# Patient Record
Sex: Male | Born: 1940 | ZIP: ~
Health system: Southern US, Community
[De-identification: ages and names within clinical notes are randomized; demographics above are authoritative.]

## PROBLEM LIST (undated history)

## (undated) DIAGNOSIS — Z87438 Personal history of other diseases of male genital organs: Secondary | ICD-10-CM

## (undated) DIAGNOSIS — C61 Malignant neoplasm of prostate: Secondary | ICD-10-CM

## (undated) DIAGNOSIS — Z9889 Other specified postprocedural states: Secondary | ICD-10-CM

## (undated) DIAGNOSIS — N138 Other obstructive and reflux uropathy: Secondary | ICD-10-CM

## (undated) DIAGNOSIS — N529 Male erectile dysfunction, unspecified: Secondary | ICD-10-CM

## (undated) DIAGNOSIS — K219 Gastro-esophageal reflux disease without esophagitis: Secondary | ICD-10-CM

## (undated) DIAGNOSIS — I10 Essential (primary) hypertension: Secondary | ICD-10-CM

## (undated) DIAGNOSIS — G7000 Myasthenia gravis without (acute) exacerbation: Secondary | ICD-10-CM

## (undated) DIAGNOSIS — N401 Enlarged prostate with lower urinary tract symptoms: Secondary | ICD-10-CM

## (undated) DIAGNOSIS — H35349 Macular cyst, hole, or pseudohole, unspecified eye: Secondary | ICD-10-CM

## (undated) DIAGNOSIS — E7800 Pure hypercholesterolemia, unspecified: Secondary | ICD-10-CM

## (undated) HISTORY — DX: Gastro-esophageal reflux disease without esophagitis: K21.9

## (undated) HISTORY — PX: APPENDECTOMY: SHX54

## (undated) HISTORY — DX: Pure hypercholesterolemia, unspecified: E78.00

## (undated) HISTORY — DX: Malignant neoplasm of prostate: C61

## (undated) HISTORY — DX: Macular cyst, hole, or pseudohole, unspecified eye: H35.349

## (undated) HISTORY — DX: Other obstructive and reflux uropathy: N13.8

## (undated) HISTORY — PX: PARS PLANA VITRECTOMY W/ REPAIR OF MACULAR HOLE: SHX2170

## (undated) HISTORY — DX: Personal history of other diseases of male genital organs: Z87.438

## (undated) HISTORY — DX: Essential (primary) hypertension: I10

## (undated) HISTORY — DX: Other specified postprocedural states: Z98.890

## (undated) HISTORY — PX: UVULOPLASTY: SHX6557

## (undated) HISTORY — PX: ROTATOR CUFF REPAIR: SHX139

## (undated) HISTORY — DX: Benign prostatic hyperplasia with lower urinary tract symptoms: N40.1

## (undated) HISTORY — DX: Male erectile dysfunction, unspecified: N52.9

---

## 2000-02-09 ENCOUNTER — Ambulatory Visit (HOSPITAL_BASED_OUTPATIENT_CLINIC_OR_DEPARTMENT_OTHER): Admission: RE | Admit: 2000-02-09 | Discharge: 2000-02-09 | Payer: Self-pay | Admitting: Otolaryngology

## 2000-10-17 ENCOUNTER — Ambulatory Visit (HOSPITAL_COMMUNITY): Admission: RE | Admit: 2000-10-17 | Discharge: 2000-10-18 | Payer: Self-pay | Admitting: Ophthalmology

## 2000-10-17 ENCOUNTER — Encounter: Payer: Self-pay | Admitting: Ophthalmology

## 2002-11-18 ENCOUNTER — Encounter (INDEPENDENT_AMBULATORY_CARE_PROVIDER_SITE_OTHER): Payer: Self-pay | Admitting: Specialist

## 2002-11-18 ENCOUNTER — Ambulatory Visit (HOSPITAL_COMMUNITY): Admission: RE | Admit: 2002-11-18 | Discharge: 2002-11-18 | Payer: Self-pay | Admitting: Gastroenterology

## 2003-07-10 ENCOUNTER — Ambulatory Visit (HOSPITAL_COMMUNITY): Admission: RE | Admit: 2003-07-10 | Discharge: 2003-07-11 | Payer: Self-pay | Admitting: Ophthalmology

## 2005-06-03 ENCOUNTER — Encounter: Admission: RE | Admit: 2005-06-03 | Discharge: 2005-06-03 | Payer: Self-pay | Admitting: Family Medicine

## 2007-04-19 ENCOUNTER — Encounter: Admission: RE | Admit: 2007-04-19 | Discharge: 2007-04-19 | Payer: Self-pay | Admitting: Family Medicine

## 2007-05-31 ENCOUNTER — Encounter: Admission: RE | Admit: 2007-05-31 | Discharge: 2007-06-12 | Payer: Self-pay | Admitting: Orthopedic Surgery

## 2008-04-25 ENCOUNTER — Ambulatory Visit (HOSPITAL_BASED_OUTPATIENT_CLINIC_OR_DEPARTMENT_OTHER): Admission: RE | Admit: 2008-04-25 | Discharge: 2008-04-25 | Payer: Self-pay | Admitting: Urology

## 2008-04-25 ENCOUNTER — Encounter (INDEPENDENT_AMBULATORY_CARE_PROVIDER_SITE_OTHER): Payer: Self-pay | Admitting: Urology

## 2009-03-17 ENCOUNTER — Encounter: Admission: RE | Admit: 2009-03-17 | Discharge: 2009-03-17 | Payer: Self-pay | Admitting: Family Medicine

## 2010-04-28 LAB — POCT I-STAT 4, (NA,K, GLUC, HGB,HCT)
Glucose, Bld: 105 mg/dL — ABNORMAL HIGH (ref 70–99)
HCT: 46 % (ref 39.0–52.0)
Hemoglobin: 15.6 g/dL (ref 13.0–17.0)
Potassium: 3.9 mEq/L (ref 3.5–5.1)
Sodium: 143 mEq/L (ref 135–145)

## 2010-06-01 NOTE — Op Note (Signed)
NAME:  Travis Morris, WHORLEY             ACCOUNT NO.:  [id number]   MEDICAL RECORD NO.:  [id number]          PATIENT TYPE:  AMB   LOCATION:  [facility]                         FACILITY:  [facility]   PHYSICIAN:  Maretta Bees. Vonita Moss, M.D.DATE OF BIRTH:  [birth year]   DATE OF PROCEDURE:  04/25/2008  DATE OF DISCHARGE:                               OPERATIVE REPORT   PREOPERATIVE DIAGNOSIS:  Phimosis and balanitis.   POSTOPERATIVE DIAGNOSES:  Phimosis and balanitis.   PROCEDURE:  Circumcision and biopsy of glans penis.   SURGEON:  Dr. Larey Dresser   ANESTHESIA:  General.   INDICATIONS:  This gentleman has had an increasing history over the last  year of penile irritation, swelling and thickening of his foreskin and  redness of the glans penis.  He was advised that this will probably be a  progressive problem and he opted to go ahead with circumcision and  biopsy of the glans penis.  He was advised about the risk of swelling,  bleeding and postop discomfort.   PROCEDURE:  The patient was brought to the operating room, placed in  supine position, external genitalia prepped and draped in usual fashion.  A circumcision was performed using the sleeve technique.  Hemostasis was  obtained by the use of catgut ties and electrocautery.  The frenulum  area was closed with running 4-0 chromic catgut and 4-0 chromic catgut  was placed at 3, 6, 9 and 12 o'clock to approximate the penile skin with  the mucosal edge of foreskin.  Before running 4-0 chromic catgut between  these quadrant sutures, I injected 0.25% Marcaine.  I also did a biopsy  of an erythematous area on the bottom of the glans penis and closed this  biopsy site with 4-0 chromic catgut.  At this point, there was good  hemostasis.  Blood loss was minimal.  The wound was cleaned and dressed  with Vaseline gauze, dry sterile gauze and a Coban dressing.  He was  taken to the recovery room in good condition, having tolerated the  procedure  well      Maretta Bees. Vonita Moss, M.D.  Electronically Signed     LJP/MEDQ  D:  04/25/2008  T:  04/25/2008  Job:  [id number]   cc:   Jethro Bastos, M.D.  Fax: [phone]

## 2010-06-04 NOTE — Op Note (Signed)
   NAME:  Travis Morris, Travis Morris                       ACCOUNT NO.:  [id number]   MEDICAL RECORD NO.:  [id number]                   PATIENT TYPE:  AMB   LOCATION:  ENDO                                 FACILITY:  [facility]   PHYSICIAN:  Graylin Shiver, M.D.                DATE OF BIRTH:  22-Dec-1940   DATE OF PROCEDURE:  11/18/2002  DATE OF DISCHARGE:                                 OPERATIVE REPORT   PROCEDURE:  Colonoscopy with biopsy.   ENDOSCOPIST:  Graylin Shiver, M.D.   INDICATIONS FOR PROCEDURE:  A family history of colon cancer.   INFORMED CONSENT:  An informed consent was obtained after the explanation of  the risks of bleeding, infection and perforation.   PREMEDICATION:  Fentanyl 70 mcg IV, Versed 7 mg IV.   DESCRIPTION OF PROCEDURE:  With the patient in the left lateral decubitus  position, a rectal examination was performed, and no masses were felt.  The  Olympus colonoscope was inserted into the rectum and advanced around the  colon to the cecum.  The cecal landmarks were identified.  The cecum looked  normal.  In the mid-ascending colon there was a small 3.0 mm sessile polyp  removed with cold forceps.  The transverse colon, descending colon, sigmoid  and rectum were normal.  He tolerated the procedure well without complications.   IMPRESSION:  Small polyp in the ascending colon.   PLAN:  The pathology will be checked.  I would recommend a follow-up  colonoscopy again in five years.                                               Graylin Shiver, M.D.    SFG/MEDQ  D:  11/18/2002  T:  11/18/2002  Job:  [id number]   cc:   Jethro Bastos, M.D.  [address]  [place]  [place] [address]  Fax: [phone]

## 2010-06-04 NOTE — Op Note (Signed)
[place]  Patient:    Travis, Morris Visit Number: [id number] MRN: [id number]          Service Type: DSU Location: RCRM 2550 07 Attending Physician:  Bertrum Sol Dictated by:   Beulah Gandy. Ashley Royalty, M.D. Proc. Date: 10/17/00 Admit Date:  10/17/2000                             Operative Report  DATE OF BIRTH:  01-Aug-1940  PREOPERATIVE DIAGNOSIS:  Macular hole, right eye.  POSTOPERATIVE DIAGNOSIS:  OPERATION PERFORMED:  Pars plana vitrectomy, retinal photocoagulation, membrane peel, serum patch and perfluoropropane injection, right eye.  SURGEON:  Beulah Gandy. Ashley Royalty, M.D.  ASSISTANT:  Lu Duffel, COA, SA  ANESTHESIA:  General.  DESCRIPTION OF PROCEDURE:  Usual prep and drape.  Peritomies at 8, 10 and 2 oclock.  4 mm infusion port anchored into place at 8 oclock.  The contact lens ring was anchored into place at 6 and 12 oclock.  The light pick and the cutter were placed at 10 and 2 oclock respectively.  The flat contact lens was placed onto a layer of methylcellulose and onto the cornea.  The pars plana vitrectomy was begun just behind the crystalline lens.  Vitreous membranes were encountered.  These were carefully removed under low suction and rapid cutting.  Some vitreous tissue was adherent to the disk and this was lifted free with the pick and the forceps.  The silicone tip suction line was moved into place and drawn down to the macular surface where the fish strike sign occurred.  Multiple areas of posterior hyaloid were pulled up with the suction line and peeled with the vitreous pick.  Once this was accomplished, the diamond dusted membrane scraper was used to remove the internal limiting membrane around the edge of the macular hole.  The vitrectomy carried out into the far peripheral vitreous space with additional vitreous cutting.  Once this was accomplished, the head scope indirect laser was used to deliver 446  burns around the retinal periphery in two rows.  The power was 400 milliwatts 1000 microns each and 0.1 seconds each.  A gas-fluid exchange was carried out. Sufficient time was allowed for additional fluid to track down the walls of the eye and collect in the posterior segments.  Additional fluid removal was performed with the [employer] brush.  Serum patch was delivered. Perfluoropropane 16% was exchanged for intravitreal gas.  The instruments were removed from the eye and 9-0 nylon was used to close the sclerotomy sites. Conjunctiva was closed with wetfield cautery.  Closing tension was 10 with a Barraquer tonometer.  Polymyxin and gentamicin were irrigated into Tenons space.  Atropine solution was applied.  Decadron 10 mg was injected into the lower subconjunctival space.  Marcaine was injected around the globe for postoperative pain.  Polysporin, a patch and shield were placed.  The patient was awakened and taken to recovery in satisfactory condition.  COMPLICATIONS:  None.  DURATION:  One hour. Dictated by:   Beulah Gandy. Ashley Royalty, M.D. Attending Physician:  Bertrum Sol DD:  10/17/00 TD:  10/17/00 Job: [id number]

## 2010-06-04 NOTE — Op Note (Signed)
NAME:  Travis Morris, Travis Morris                       ACCOUNT NO.:  [id number]   MEDICAL RECORD NO.:  [id number]                   PATIENT TYPE:  OIB   LOCATION:  2894                                 FACILITY:  [facility]   PHYSICIAN:  Beulah Gandy. Ashley Royalty, M.D.              DATE OF BIRTH:  03/22/1940   DATE OF PROCEDURE:  07/10/2003  DATE OF DISCHARGE:                                 OPERATIVE REPORT   PREOPERATIVE DIAGNOSIS:  Macular hole, left eye.   POSTOPERATIVE DIAGNOSIS:  Macular hole, left eye.   OPERATION PERFORMED:  Pars plana vitrectomy, membrane peel, serum patch,  retinal photocoagulation, perfluoropropane injection in the left eye.   SURGEON:  Beulah Gandy. Ashley Royalty, M.D.   ASSISTANT:  Bryan Lemma. Lundquist, P.A.   ANESTHESIA:  General.   DESCRIPTION OF PROCEDURE:  Usual prep and drape.  Peritomies at 10, 2 and 4  o'clock.  The 4 mm angled infusion port was anchored into place at 4  o'clock.  The lighted pick and the cutter were placed at 10 and 2 o'clock  respectively.  Contact lens ring anchored into place at 6 and 12 o'clock.  Methylcellulose was placed on the corneal surface and the flat contact lens  was placed.  The pars plana vitrectomy was begun just behind the crystalline  lens.  Vitreous and membranes were encountered.  These were carefully  removed under low suction and rapid cutting.  A large clot of blood was seen  overlying the macular region.  This was removed carefully with the vitreous  cutter and the extrusion line.  The posterior hyaloid was ensnared in this  clot and it was removed carefully under low suction and rapid cutting.  The  posterior hyaloid was pulled from its attachment to the disk as well.  The  lighted pick was used for this membrane peel. Once this was accomplished, a  30 degree prismatic lens was moved into place and the vitrectomy was carried  out into the far periphery.  The vitreous base was trimmed for 360 degrees.  The magnifying contact lens was  moved into place and the diamond dusted  membrane scraper was used to remove the internal limiting membrane from  around the edge of the macular hole.  The instruments were removed from the  eye.  The indirect ophthalmoscope laser was moved into place.  671 burns  were placed in two rows around the retinal periphery.  The power was 400 mW,  1000 microns each and 0.1 seconds each.  A gas-fluid exchange was carried  out.  Sufficient time was allowed for additional fluid to track down the  walls of the eye and collect in the posterior segment.  The additional fluid  was removed.  The serum patch was delivered, perfluoropropane 17% was  exchanged for intravitreal gas.  The instruments were removed from the eye  and 9-0 nylon was used to close the sclerotomy sites.  The  conjunctiva was  closed with wet field cautery.  Polymyxin and gentamicin were irrigated into  Tenon's space.  Atropine solution was applied.  Decadron 10 mg was injected  into the lower subconjunctival space.  Marcaine was injected around the  globe for postoperative pain.  Polysporin, a patch and shield were placed.  The closing tension was 10 with a Barraquer tonometer.  The patient was  awakened and taken to recovery in satisfactory condition.                                               Beulah Gandy. Ashley Royalty, M.D.   JDM/MEDQ  D:  07/10/2003  T:  07/12/2003  Job:  [id number]

## 2010-11-24 ENCOUNTER — Other Ambulatory Visit: Payer: Self-pay | Admitting: Urology

## 2010-12-07 ENCOUNTER — Encounter: Payer: Self-pay | Admitting: *Deleted

## 2010-12-07 DIAGNOSIS — C61 Malignant neoplasm of prostate: Secondary | ICD-10-CM | POA: Insufficient documentation

## 2010-12-07 NOTE — Progress Notes (Signed)
PATH: PROSTATE BXS: 11/17/10=  ADENOCARCINOMA, GLEASO=3+4=7 & 3+3=6, VOLUME=43CC,PSA=6.84 04/05/10 PSA=4.42, BXS DONE HIGH GRADE PROSTATIC INTRAEPITHELIAL NEOPLASIA   MARRIED, ACCOUNTANT,2 CHILDREN,FRATERNAL HX LIVER  CANCER   ALLERGIES:NKDA

## 2010-12-08 ENCOUNTER — Encounter: Payer: Self-pay | Admitting: Radiation Oncology

## 2010-12-08 ENCOUNTER — Ambulatory Visit
Admission: RE | Admit: 2010-12-08 | Discharge: 2010-12-08 | Disposition: A | Discharge status: Home / Self Care | Payer: Medicare Other | Source: Ambulatory Visit | Attending: Radiation Oncology | Admitting: Radiation Oncology

## 2010-12-08 ENCOUNTER — Telehealth: Payer: Self-pay | Admitting: *Deleted

## 2010-12-08 VITALS — BP 148/65 | HR 62 | Temp 97.1°F | Resp 20 | Ht 68.0 in | Wt 212.3 lb

## 2010-12-08 DIAGNOSIS — K219 Gastro-esophageal reflux disease without esophagitis: Secondary | ICD-10-CM | POA: Insufficient documentation

## 2010-12-08 DIAGNOSIS — Z7982 Long term (current) use of aspirin: Secondary | ICD-10-CM | POA: Insufficient documentation

## 2010-12-08 DIAGNOSIS — I10 Essential (primary) hypertension: Secondary | ICD-10-CM | POA: Insufficient documentation

## 2010-12-08 DIAGNOSIS — Z79899 Other long term (current) drug therapy: Secondary | ICD-10-CM | POA: Insufficient documentation

## 2010-12-08 DIAGNOSIS — C61 Malignant neoplasm of prostate: Secondary | ICD-10-CM

## 2010-12-08 DIAGNOSIS — E7800 Pure hypercholesterolemia, unspecified: Secondary | ICD-10-CM | POA: Insufficient documentation

## 2010-12-08 NOTE — Progress Notes (Signed)
I[pps score=9, no c/o pain  Or discomfort, \9:32 AM

## 2010-12-08 NOTE — Progress Notes (Signed)
Please see the Nurse Progress Note in the MD Initial Consult Encounter for this patient. 

## 2010-12-08 NOTE — Telephone Encounter (Signed)
A user error has taken place: encounter opened in error, closed for administrative reasons.

## 2010-12-13 NOTE — Progress Notes (Signed)
CC:   Excell Seltzer. Annabell Howells, M.D. Ancil Boozer, MD  REFERRING PHYSICIAN:  Excell Seltzer. Annabell Howells, M.D.   DIAGNOSIS:  Stage T1c, Gleason 7 adenocarcinoma of the prostate.  HISTORY OF PRESENT ILLNESS:  Travis Morris is a very pleasant, 70 year old, gentleman who is seen as a courtesy for Dr. Annabell Howells for an opinion concerning radiation therapy as part of the management of the patient's recently diagnosed early stage prostate cancer.  Mr. Ferrufino presented recently with a mildly elevated PSA.  The patient is seeing Dr. Larey Dresser for this issue.  The biopsy at that time showed atypia HGPIN.  More recently, the patient's PSA had risen from 4.8 in March 2012 to 6.8.  In light of this, a repeat biopsy was indicated.  The patient was taken to the operating room by Dr. Bjorn Pippin.  On the ultrasound, the prostate measured 43 cc.  On this evaluation, the patient was noted to have 4 cores showing adenocarcinoma.  The left base showed a Gleason score of 7 (3+4), showing 30% of 1 core involvement.  In addition, the left mid, left lateral apex, and left apex areas also showed adenocarcinoma with a Gleason score of 6 (3+3).  In light of the above findings, the options for management were discussed in detail with the patient by Dr. Annabell Howells.  The issues discussed were watchful waiting, radical prostatectomy, and radiation therapy. The patient is now interested in hearing more concerning his radiation therapy options and is now referred to our department for this issue.  ALLERGIES:  The patient has no known drug allergies.  CURRENT MEDICATIONS: 1. Aspirin 81 mg daily. 2. HydroDIURIL 25 mg. 3. Bystolic 10 mg. 4. Prilosec 20 mg. 5. Crestor 10 mg. 6. Saw palmetto 80 mg capsule. 7. Vitamin D 400 international units. 8. Multivitamin supplement.  PRIOR SURGERIES: 1. Appendectomy. 2. Uvulopalatoplasty in the past. 3. Rotator cuff repair.  MEDICAL HISTORY:  Significant for: 1. BPH with urinary retention. 2.  History of organic impotence. 3. History of balanitis. 4. GERD. 5. Hypercholesterolemia. 6. Hypertension. 7. History of phimosis.  SOCIAL HISTORY:  The patient is married and is accompanied by his wife on evaluation today.  The patient drinks between 3-4 ounces of gin weekly.  There is no history of tobacco use.  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  FAMILY HISTORY:  No family history of prostate cancer.  The patient's brother died of metastatic colon cancer to the liver at age 65.  The patient's father died of dementia at age 74.  REVIEW OF SYSTEMS:  The patient completed the International Prostate Symptom Score with a total score of 9.  The most significant score was in urinary urgency.  The patient also has a significant score in frequency.  Nocturia is 1.  The patient denies any new bony pain, headaches, dizziness, or blurred vision.  The patient does have impotency, which he has had for some time.  A complete review of systems is undertaken with the patient by myself and other than the above- mentioned issues, it is remarkable.  PHYSICAL EXAMINATION:  General Appearance:  This is a very pleasant, 57- year-old gentleman in no acute distress.  Vital Signs:  Temperature 97.1.  Pulse 62.  Blood pressure 148/65.  Weight is 212 pounds.  Height is 5 feet and 8 inches.  Further exam is deferred.  X-RAY STUDIES:  None indicated.  LABORATORY DATA:  Laboratory data from September  17th, PSA 6.8a4, free PSA is 1.12 ng/mL, and percent free PSA is 16.  IMPRESSION AND PLAN:  Stage T1c, Gleason 7 adenocarcinoma of the prostate.  I discussed the pathologic findings and prognostic implications of his diagnosis.  I discussed with Mr. Buell that his prognosis relates to his presenting PSA, Gleason score, and clinical stage.  All of these factors seem favorable.  I spent over an hour discussing management of early  stage prostate cancer with Mr. Pierre and his wife.  We discussed watchful waiting, radical prostatectomy, robotic-assisted prostatectomy as well as his radiation therapy options including IMRT and combined modality therapy with 5 weeks of external beam followed by radioactive seed implantation.  The patient appears to have relatively excellent health for his age of 40.  He potentially would be a candidate for radical prostatectomy. Given the patient's early stage, I would not recommend androgen ablation as part of his initial management.  I had discussed in detail treatment with IMRT, side effects, and potential toxicities.  We also discussed in detail conformal radiation therapy with limited seed implant.  At this point, the patient is undecided which treatment approach he would like to proceed with.  The patient is quite active with winter sport (curling) and would like to potentially defer any treatments until later in the spring or summer.  Given the patient's early stage as well as low PSA, I feel this would be a reasonable approach.  Mr. Breighner, given his young age, does not feel comfortable with continued watchful waiting, however.  I have recommended he followup with Dr. Annabell Howells in late January or early February for additional PSA.  If this continues to rise, then I would recommend proceeding with treatment.  If his PSA is stable or decreases, then the patient may wish to continue watchful waiting.  At this point, Mr. Laspina does seem to be interested in definitive treatment rather than watchful waiting.  I did also discussed with Mr. Trela that if he does decide on radiation therapy as management, he will need to see Dr. Annabell Howells at a later date for placement of gold fiducial markers for image-guided therapy.    ______________________________ Billie Lade, Ph.D., M.D. JDK/MEDQ  D:  12/08/2010  T:  12/08/2010  Job:  [id number]

## 2011-01-04 ENCOUNTER — Encounter (HOSPITAL_COMMUNITY): Admission: RE | Payer: Self-pay | Source: Ambulatory Visit

## 2011-01-04 ENCOUNTER — Inpatient Hospital Stay (HOSPITAL_COMMUNITY): Admission: RE | Admit: 2011-01-04 | Payer: Medicare Other | Source: Ambulatory Visit | Admitting: Urology

## 2011-01-04 SURGERY — ROBOTIC ASSISTED LAPAROSCOPIC RADICAL PROSTATECTOMY
Anesthesia: General

## 2011-01-27 DIAGNOSIS — C61 Malignant neoplasm of prostate: Secondary | ICD-10-CM | POA: Diagnosis not present

## 2011-02-09 DIAGNOSIS — E7800 Pure hypercholesterolemia, unspecified: Secondary | ICD-10-CM | POA: Diagnosis not present

## 2011-02-09 DIAGNOSIS — I10 Essential (primary) hypertension: Secondary | ICD-10-CM | POA: Diagnosis not present

## 2011-03-14 ENCOUNTER — Ambulatory Visit
Admission: RE | Admit: 2011-03-14 | Discharge: 2011-03-14 | Disposition: A | Discharge status: Home / Self Care | Payer: Medicare Other | Source: Ambulatory Visit | Attending: Radiation Oncology | Admitting: Radiation Oncology

## 2011-03-14 VITALS — BP 120/80 | HR 51 | Temp 97.6°F | Wt 210.9 lb

## 2011-03-14 DIAGNOSIS — C61 Malignant neoplasm of prostate: Secondary | ICD-10-CM | POA: Diagnosis not present

## 2011-03-14 DIAGNOSIS — K219 Gastro-esophageal reflux disease without esophagitis: Secondary | ICD-10-CM | POA: Diagnosis not present

## 2011-03-14 DIAGNOSIS — E7800 Pure hypercholesterolemia, unspecified: Secondary | ICD-10-CM | POA: Diagnosis not present

## 2011-03-14 DIAGNOSIS — Z7982 Long term (current) use of aspirin: Secondary | ICD-10-CM | POA: Insufficient documentation

## 2011-03-14 DIAGNOSIS — Z79899 Other long term (current) drug therapy: Secondary | ICD-10-CM | POA: Diagnosis not present

## 2011-03-14 DIAGNOSIS — I10 Essential (primary) hypertension: Secondary | ICD-10-CM | POA: Insufficient documentation

## 2011-03-14 NOTE — Progress Notes (Signed)
Here for follow up new consult post watchful waiting.Repeat PSA performed 01/27/2011 4.95. Denies pain. No hospitalizations or illness since consultation 11/2010. IpSS score 8. New medication lisinopril added to blood pressure medications.

## 2011-03-14 NOTE — Progress Notes (Signed)
CC:   Excell Seltzer. Annabell Howells, M.D. Ancil Boozer, MD  DIAGNOSIS:  Stage T1c, Gleason 7 adenocarcinoma of the prostate.  NARRATIVE:  Travis Morris returns today for further evaluation.  He was seen in consultation on December 08, 2010.  At that time, the patient was undecided on his treatment approach and was busy with his winter sport, curling.  Clinically, the patient has been doing well.  He has nocturia 1-2 times.  He denies any hematuria or dysuria or bowel complaints.  The patient did have a PSA drawn through Dr. Belva Crome office on January 27, 2011.  This did show the patient's PSA had dropped down to 4.95 from a prior reading in March of last year of 6.8.  PHYSICAL EXAMINATION:  Vital Signs:  The patient's temperature is 97.6, pulse is 51, blood pressure is 120/80.  Weight is 210 pounds. Examination of the neck and supraclavicular region reveals no evidence of adenopathy.  Lungs:  Clear to auscultation.  Heart:  Regular rhythm and rate.  Abdomen:  Soft, nontender with normal bowel sounds.  IMPRESSION AND PLAN:  Stage T1c prostate cancer.  As above, the patient's PSA has decreased since his last evaluation.  At this time, given his PSA drop, Mr. Crihfield is more in favor of watchful waiting.  I have recommended the patient see Dr. Annabell Howells later this spring or early summer for a repeat PSA.  At this time, I have not scheduled Mr. Shells for a formal followup but would be glad to see him at any time.    ______________________________ Billie Lade, Ph.D., M.D. JDK/MEDQ  D:  03/14/2011  T:  03/14/2011  Job:  [id number]

## 2011-03-14 NOTE — Progress Notes (Signed)
Please see the Nurse Progress Note in the MD Initial Consult Encounter for this patient. 

## 2011-03-16 NOTE — Progress Notes (Signed)
Encounter addended by: Tessa Lerner, RN on: 03/16/2011  3:03 PM<BR>     Documentation filed: Charges VN

## 2011-06-08 DIAGNOSIS — C61 Malignant neoplasm of prostate: Secondary | ICD-10-CM | POA: Diagnosis not present

## 2011-08-17 DIAGNOSIS — I10 Essential (primary) hypertension: Secondary | ICD-10-CM | POA: Diagnosis not present

## 2011-08-17 DIAGNOSIS — C61 Malignant neoplasm of prostate: Secondary | ICD-10-CM | POA: Diagnosis not present

## 2011-09-14 DIAGNOSIS — C61 Malignant neoplasm of prostate: Secondary | ICD-10-CM | POA: Diagnosis not present

## 2011-11-15 DIAGNOSIS — Z23 Encounter for immunization: Secondary | ICD-10-CM | POA: Diagnosis not present

## 2011-11-30 DIAGNOSIS — R3915 Urgency of urination: Secondary | ICD-10-CM | POA: Diagnosis not present

## 2011-11-30 DIAGNOSIS — C61 Malignant neoplasm of prostate: Secondary | ICD-10-CM | POA: Diagnosis not present

## 2011-11-30 DIAGNOSIS — R972 Elevated prostate specific antigen [PSA]: Secondary | ICD-10-CM | POA: Diagnosis not present

## 2011-12-14 DIAGNOSIS — E7800 Pure hypercholesterolemia, unspecified: Secondary | ICD-10-CM | POA: Diagnosis not present

## 2011-12-14 DIAGNOSIS — I10 Essential (primary) hypertension: Secondary | ICD-10-CM | POA: Diagnosis not present

## 2011-12-14 DIAGNOSIS — R7309 Other abnormal glucose: Secondary | ICD-10-CM | POA: Diagnosis not present

## 2011-12-28 DIAGNOSIS — R972 Elevated prostate specific antigen [PSA]: Secondary | ICD-10-CM | POA: Diagnosis not present

## 2011-12-28 DIAGNOSIS — C61 Malignant neoplasm of prostate: Secondary | ICD-10-CM | POA: Diagnosis not present

## 2011-12-28 DIAGNOSIS — IMO0002 Reserved for concepts with insufficient information to code with codable children: Secondary | ICD-10-CM | POA: Diagnosis not present

## 2012-03-28 DIAGNOSIS — L299 Pruritus, unspecified: Secondary | ICD-10-CM | POA: Diagnosis not present

## 2012-03-28 DIAGNOSIS — R972 Elevated prostate specific antigen [PSA]: Secondary | ICD-10-CM | POA: Diagnosis not present

## 2012-03-28 DIAGNOSIS — C61 Malignant neoplasm of prostate: Secondary | ICD-10-CM | POA: Diagnosis not present

## 2012-03-29 DIAGNOSIS — E7800 Pure hypercholesterolemia, unspecified: Secondary | ICD-10-CM | POA: Diagnosis not present

## 2012-03-29 DIAGNOSIS — Z0000 Encounter for general adult medical examination without abnormal findings: Secondary | ICD-10-CM | POA: Diagnosis not present

## 2012-03-29 DIAGNOSIS — R7309 Other abnormal glucose: Secondary | ICD-10-CM | POA: Diagnosis not present

## 2012-03-29 DIAGNOSIS — I10 Essential (primary) hypertension: Secondary | ICD-10-CM | POA: Diagnosis not present

## 2012-03-29 DIAGNOSIS — C61 Malignant neoplasm of prostate: Secondary | ICD-10-CM | POA: Diagnosis not present

## 2012-06-20 DIAGNOSIS — H35349 Macular cyst, hole, or pseudohole, unspecified eye: Secondary | ICD-10-CM | POA: Diagnosis not present

## 2012-06-29 DIAGNOSIS — E7800 Pure hypercholesterolemia, unspecified: Secondary | ICD-10-CM | POA: Diagnosis not present

## 2012-06-29 DIAGNOSIS — I10 Essential (primary) hypertension: Secondary | ICD-10-CM | POA: Diagnosis not present

## 2012-07-27 DIAGNOSIS — C61 Malignant neoplasm of prostate: Secondary | ICD-10-CM | POA: Diagnosis not present

## 2012-08-02 DIAGNOSIS — C61 Malignant neoplasm of prostate: Secondary | ICD-10-CM | POA: Diagnosis not present

## 2012-08-02 DIAGNOSIS — R3915 Urgency of urination: Secondary | ICD-10-CM | POA: Diagnosis not present

## 2012-08-02 DIAGNOSIS — R972 Elevated prostate specific antigen [PSA]: Secondary | ICD-10-CM | POA: Diagnosis not present

## 2012-11-09 DIAGNOSIS — Z23 Encounter for immunization: Secondary | ICD-10-CM | POA: Diagnosis not present

## 2012-12-27 DIAGNOSIS — B029 Zoster without complications: Secondary | ICD-10-CM | POA: Diagnosis not present

## 2013-02-04 DIAGNOSIS — C61 Malignant neoplasm of prostate: Secondary | ICD-10-CM | POA: Diagnosis not present

## 2013-04-05 DIAGNOSIS — R7309 Other abnormal glucose: Secondary | ICD-10-CM | POA: Diagnosis not present

## 2013-04-05 DIAGNOSIS — I10 Essential (primary) hypertension: Secondary | ICD-10-CM | POA: Diagnosis not present

## 2013-04-05 DIAGNOSIS — Z23 Encounter for immunization: Secondary | ICD-10-CM | POA: Diagnosis not present

## 2013-04-05 DIAGNOSIS — Z1211 Encounter for screening for malignant neoplasm of colon: Secondary | ICD-10-CM | POA: Diagnosis not present

## 2013-04-05 DIAGNOSIS — Z0000 Encounter for general adult medical examination without abnormal findings: Secondary | ICD-10-CM | POA: Diagnosis not present

## 2013-04-05 DIAGNOSIS — C61 Malignant neoplasm of prostate: Secondary | ICD-10-CM | POA: Diagnosis not present

## 2013-04-05 DIAGNOSIS — R42 Dizziness and giddiness: Secondary | ICD-10-CM | POA: Diagnosis not present

## 2013-04-05 DIAGNOSIS — E7800 Pure hypercholesterolemia, unspecified: Secondary | ICD-10-CM | POA: Diagnosis not present

## 2013-04-25 DIAGNOSIS — C61 Malignant neoplasm of prostate: Secondary | ICD-10-CM | POA: Diagnosis not present

## 2013-04-29 DIAGNOSIS — N138 Other obstructive and reflux uropathy: Secondary | ICD-10-CM | POA: Diagnosis not present

## 2013-04-29 DIAGNOSIS — R3915 Urgency of urination: Secondary | ICD-10-CM | POA: Diagnosis not present

## 2013-04-29 DIAGNOSIS — N139 Obstructive and reflux uropathy, unspecified: Secondary | ICD-10-CM | POA: Diagnosis not present

## 2013-04-29 DIAGNOSIS — C61 Malignant neoplasm of prostate: Secondary | ICD-10-CM | POA: Diagnosis not present

## 2013-04-29 DIAGNOSIS — N401 Enlarged prostate with lower urinary tract symptoms: Secondary | ICD-10-CM | POA: Diagnosis not present

## 2013-07-15 DIAGNOSIS — T148XXA Other injury of unspecified body region, initial encounter: Secondary | ICD-10-CM | POA: Diagnosis not present

## 2013-07-15 DIAGNOSIS — M79609 Pain in unspecified limb: Secondary | ICD-10-CM | POA: Diagnosis not present

## 2013-08-23 ENCOUNTER — Other Ambulatory Visit: Payer: Self-pay | Admitting: Gastroenterology

## 2013-08-23 DIAGNOSIS — Z86010 Personal history of colonic polyps: Secondary | ICD-10-CM | POA: Diagnosis not present

## 2013-08-23 DIAGNOSIS — Z09 Encounter for follow-up examination after completed treatment for conditions other than malignant neoplasm: Secondary | ICD-10-CM | POA: Diagnosis not present

## 2013-08-23 DIAGNOSIS — D126 Benign neoplasm of colon, unspecified: Secondary | ICD-10-CM | POA: Diagnosis not present

## 2013-10-03 DIAGNOSIS — R7309 Other abnormal glucose: Secondary | ICD-10-CM | POA: Diagnosis not present

## 2013-10-03 DIAGNOSIS — I10 Essential (primary) hypertension: Secondary | ICD-10-CM | POA: Diagnosis not present

## 2013-10-03 DIAGNOSIS — Z23 Encounter for immunization: Secondary | ICD-10-CM | POA: Diagnosis not present

## 2013-10-03 DIAGNOSIS — K130 Diseases of lips: Secondary | ICD-10-CM | POA: Diagnosis not present

## 2013-10-03 DIAGNOSIS — E7800 Pure hypercholesterolemia, unspecified: Secondary | ICD-10-CM | POA: Diagnosis not present

## 2013-10-24 DIAGNOSIS — C61 Malignant neoplasm of prostate: Secondary | ICD-10-CM | POA: Diagnosis not present

## 2013-10-31 DIAGNOSIS — C61 Malignant neoplasm of prostate: Secondary | ICD-10-CM | POA: Diagnosis not present

## 2013-10-31 DIAGNOSIS — R3915 Urgency of urination: Secondary | ICD-10-CM | POA: Diagnosis not present

## 2013-10-31 DIAGNOSIS — N401 Enlarged prostate with lower urinary tract symptoms: Secondary | ICD-10-CM | POA: Diagnosis not present

## 2013-10-31 DIAGNOSIS — L291 Pruritus scroti: Secondary | ICD-10-CM | POA: Diagnosis not present

## 2013-11-07 DIAGNOSIS — H26493 Other secondary cataract, bilateral: Secondary | ICD-10-CM | POA: Diagnosis not present

## 2013-11-27 DIAGNOSIS — R062 Wheezing: Secondary | ICD-10-CM | POA: Diagnosis not present

## 2013-11-27 DIAGNOSIS — J988 Other specified respiratory disorders: Secondary | ICD-10-CM | POA: Diagnosis not present

## 2013-11-27 DIAGNOSIS — R069 Unspecified abnormalities of breathing: Secondary | ICD-10-CM | POA: Diagnosis not present

## 2013-11-27 DIAGNOSIS — R509 Fever, unspecified: Secondary | ICD-10-CM | POA: Diagnosis not present

## 2013-11-27 DIAGNOSIS — H109 Unspecified conjunctivitis: Secondary | ICD-10-CM | POA: Diagnosis not present

## 2013-11-29 ENCOUNTER — Other Ambulatory Visit: Payer: Self-pay | Admitting: Family Medicine

## 2013-11-29 ENCOUNTER — Ambulatory Visit
Admission: RE | Admit: 2013-11-29 | Discharge: 2013-11-29 | Disposition: A | Discharge status: Home / Self Care | Payer: Medicare Other | Source: Ambulatory Visit | Attending: Family Medicine | Admitting: Family Medicine

## 2013-11-29 DIAGNOSIS — R509 Fever, unspecified: Secondary | ICD-10-CM | POA: Diagnosis not present

## 2013-11-29 DIAGNOSIS — R0602 Shortness of breath: Secondary | ICD-10-CM | POA: Diagnosis not present

## 2013-11-29 DIAGNOSIS — J988 Other specified respiratory disorders: Secondary | ICD-10-CM

## 2013-12-19 DIAGNOSIS — H35343 Macular cyst, hole, or pseudohole, bilateral: Secondary | ICD-10-CM | POA: Diagnosis not present

## 2014-02-18 DIAGNOSIS — H35372 Puckering of macula, left eye: Secondary | ICD-10-CM | POA: Diagnosis not present

## 2014-02-18 DIAGNOSIS — H35342 Macular cyst, hole, or pseudohole, left eye: Secondary | ICD-10-CM | POA: Diagnosis not present

## 2014-02-18 DIAGNOSIS — Z961 Presence of intraocular lens: Secondary | ICD-10-CM | POA: Diagnosis not present

## 2014-04-03 DIAGNOSIS — H35342 Macular cyst, hole, or pseudohole, left eye: Secondary | ICD-10-CM | POA: Diagnosis not present

## 2014-04-10 DIAGNOSIS — I10 Essential (primary) hypertension: Secondary | ICD-10-CM | POA: Diagnosis not present

## 2014-04-10 DIAGNOSIS — E785 Hyperlipidemia, unspecified: Secondary | ICD-10-CM | POA: Diagnosis not present

## 2014-04-10 DIAGNOSIS — C61 Malignant neoplasm of prostate: Secondary | ICD-10-CM | POA: Diagnosis not present

## 2014-04-10 DIAGNOSIS — Z0000 Encounter for general adult medical examination without abnormal findings: Secondary | ICD-10-CM | POA: Diagnosis not present

## 2014-04-10 DIAGNOSIS — K219 Gastro-esophageal reflux disease without esophagitis: Secondary | ICD-10-CM | POA: Diagnosis not present

## 2014-04-10 DIAGNOSIS — R05 Cough: Secondary | ICD-10-CM | POA: Diagnosis not present

## 2014-04-10 DIAGNOSIS — R7309 Other abnormal glucose: Secondary | ICD-10-CM | POA: Diagnosis not present

## 2014-05-01 DIAGNOSIS — C61 Malignant neoplasm of prostate: Secondary | ICD-10-CM | POA: Diagnosis not present

## 2014-05-08 DIAGNOSIS — N401 Enlarged prostate with lower urinary tract symptoms: Secondary | ICD-10-CM | POA: Diagnosis not present

## 2014-05-08 DIAGNOSIS — N138 Other obstructive and reflux uropathy: Secondary | ICD-10-CM | POA: Diagnosis not present

## 2014-05-08 DIAGNOSIS — R972 Elevated prostate specific antigen [PSA]: Secondary | ICD-10-CM | POA: Diagnosis not present

## 2014-05-08 DIAGNOSIS — C61 Malignant neoplasm of prostate: Secondary | ICD-10-CM | POA: Diagnosis not present

## 2014-05-08 DIAGNOSIS — R3915 Urgency of urination: Secondary | ICD-10-CM | POA: Diagnosis not present

## 2014-05-09 DIAGNOSIS — R05 Cough: Secondary | ICD-10-CM | POA: Diagnosis not present

## 2014-06-12 DIAGNOSIS — H35342 Macular cyst, hole, or pseudohole, left eye: Secondary | ICD-10-CM | POA: Diagnosis not present

## 2014-06-18 DIAGNOSIS — H5213 Myopia, bilateral: Secondary | ICD-10-CM | POA: Diagnosis not present

## 2014-07-07 DIAGNOSIS — C61 Malignant neoplasm of prostate: Secondary | ICD-10-CM | POA: Diagnosis not present

## 2014-08-06 DIAGNOSIS — N5201 Erectile dysfunction due to arterial insufficiency: Secondary | ICD-10-CM | POA: Diagnosis not present

## 2014-08-06 DIAGNOSIS — R3915 Urgency of urination: Secondary | ICD-10-CM | POA: Diagnosis not present

## 2014-08-06 DIAGNOSIS — C61 Malignant neoplasm of prostate: Secondary | ICD-10-CM | POA: Diagnosis not present

## 2014-08-23 ENCOUNTER — Emergency Department (HOSPITAL_COMMUNITY): Payer: Medicare Other

## 2014-08-23 ENCOUNTER — Observation Stay (HOSPITAL_COMMUNITY)
Admission: EM | Admit: 2014-08-23 | Discharge: 2014-08-25 | Disposition: A | Discharge status: Home / Self Care | Payer: Medicare Other | Attending: Family Medicine | Admitting: Family Medicine

## 2014-08-23 ENCOUNTER — Encounter (HOSPITAL_COMMUNITY): Payer: Self-pay | Admitting: Emergency Medicine

## 2014-08-23 DIAGNOSIS — Z7982 Long term (current) use of aspirin: Secondary | ICD-10-CM | POA: Diagnosis not present

## 2014-08-23 DIAGNOSIS — G250 Essential tremor: Secondary | ICD-10-CM | POA: Diagnosis not present

## 2014-08-23 DIAGNOSIS — N401 Enlarged prostate with lower urinary tract symptoms: Secondary | ICD-10-CM | POA: Diagnosis not present

## 2014-08-23 DIAGNOSIS — N138 Other obstructive and reflux uropathy: Secondary | ICD-10-CM | POA: Insufficient documentation

## 2014-08-23 DIAGNOSIS — Z8673 Personal history of transient ischemic attack (TIA), and cerebral infarction without residual deficits: Secondary | ICD-10-CM | POA: Diagnosis not present

## 2014-08-23 DIAGNOSIS — R1110 Vomiting, unspecified: Secondary | ICD-10-CM | POA: Diagnosis not present

## 2014-08-23 DIAGNOSIS — N179 Acute kidney failure, unspecified: Secondary | ICD-10-CM | POA: Insufficient documentation

## 2014-08-23 DIAGNOSIS — J9811 Atelectasis: Secondary | ICD-10-CM | POA: Diagnosis not present

## 2014-08-23 DIAGNOSIS — C61 Malignant neoplasm of prostate: Secondary | ICD-10-CM | POA: Diagnosis present

## 2014-08-23 DIAGNOSIS — R61 Generalized hyperhidrosis: Secondary | ICD-10-CM | POA: Insufficient documentation

## 2014-08-23 DIAGNOSIS — K219 Gastro-esophageal reflux disease without esophagitis: Secondary | ICD-10-CM | POA: Diagnosis not present

## 2014-08-23 DIAGNOSIS — G459 Transient cerebral ischemic attack, unspecified: Secondary | ICD-10-CM

## 2014-08-23 DIAGNOSIS — E780 Pure hypercholesterolemia: Secondary | ICD-10-CM | POA: Insufficient documentation

## 2014-08-23 DIAGNOSIS — R079 Chest pain, unspecified: Secondary | ICD-10-CM | POA: Insufficient documentation

## 2014-08-23 DIAGNOSIS — R51 Headache: Secondary | ICD-10-CM | POA: Insufficient documentation

## 2014-08-23 DIAGNOSIS — R531 Weakness: Secondary | ICD-10-CM | POA: Diagnosis not present

## 2014-08-23 DIAGNOSIS — R001 Bradycardia, unspecified: Secondary | ICD-10-CM | POA: Diagnosis not present

## 2014-08-23 DIAGNOSIS — Z79899 Other long term (current) drug therapy: Secondary | ICD-10-CM | POA: Insufficient documentation

## 2014-08-23 DIAGNOSIS — R112 Nausea with vomiting, unspecified: Secondary | ICD-10-CM | POA: Diagnosis not present

## 2014-08-23 DIAGNOSIS — R42 Dizziness and giddiness: Principal | ICD-10-CM | POA: Insufficient documentation

## 2014-08-23 DIAGNOSIS — I10 Essential (primary) hypertension: Secondary | ICD-10-CM | POA: Insufficient documentation

## 2014-08-23 DIAGNOSIS — Z8639 Personal history of other endocrine, nutritional and metabolic disease: Secondary | ICD-10-CM

## 2014-08-23 LAB — CBC
HEMATOCRIT: 44.4 % (ref 39.0–52.0)
HEMOGLOBIN: 15.5 g/dL (ref 13.0–17.0)
MCH: 33.3 pg (ref 26.0–34.0)
MCHC: 34.9 g/dL (ref 30.0–36.0)
MCV: 95.3 fL (ref 78.0–100.0)
Platelets: 193 10*3/uL (ref 150–400)
RBC: 4.66 MIL/uL (ref 4.22–5.81)
RDW: 12.3 % (ref 11.5–15.5)
WBC: 14.2 10*3/uL — AB (ref 4.0–10.5)

## 2014-08-23 LAB — BASIC METABOLIC PANEL
Anion gap: 9 (ref 5–15)
BUN: 24 mg/dL — AB (ref 6–20)
CALCIUM: 9.1 mg/dL (ref 8.9–10.3)
CO2: 26 mmol/L (ref 22–32)
Chloride: 106 mmol/L (ref 101–111)
Creatinine, Ser: 1.06 mg/dL (ref 0.61–1.24)
GFR calc Af Amer: >60 mL/min (ref >60)
Glucose, Bld: 138 mg/dL — ABNORMAL HIGH (ref 65–99)
POTASSIUM: 3.5 mmol/L (ref 3.5–5.1)
SODIUM: 141 mmol/L (ref 135–145)

## 2014-08-23 LAB — CBG MONITORING, ED: Glucose-Capillary: 139 mg/dL — ABNORMAL HIGH (ref 65–99)

## 2014-08-23 MED ORDER — MECLIZINE HCL 25 MG PO TABS
12.5000 mg | ORAL_TABLET | Freq: Once | ORAL | Status: AC
  Administered 2014-08-24: 12.5 mg via ORAL
  Filled 2014-08-23: qty 1

## 2014-08-23 MED ORDER — SODIUM CHLORIDE 0.9 % IV BOLUS (SEPSIS)
1000.0000 mL | Freq: Once | INTRAVENOUS | Status: AC
  Administered 2014-08-24: 1000 mL via INTRAVENOUS

## 2014-08-23 NOTE — ED Notes (Signed)
Pt aware of need for a urine sample. 

## 2014-08-23 NOTE — ED Notes (Signed)
Pt arrived to the ED with a complaint of weakness.  Pt was driving back from [facility] when he got out to fuel his car and had difficulty standing and became weak.  Pt denies LOC.  Pt states he had emesis when he arrived to his house.

## 2014-08-23 NOTE — ED Provider Notes (Signed)
CSN: [id number]     Arrival date & time 08/23/14  2157 History  This chart was scribed for Travis Bloodworth, MD by Hansel Feinstein, ED Scribe. This patient was seen in room WA18/WA18 and the patient's care was started at 11:24 PM.     Chief Complaint  Patient presents with  . Weakness   Patient is a 74 y.o. male presenting with weakness. The history is provided by the patient and the spouse. No language interpreter was used.  Weakness This is a new problem. The current episode started 3 to 5 hours ago. The problem occurs constantly. The problem has not changed since onset.Associated symptoms include headaches. Pertinent negatives include no chest pain, no abdominal pain and no shortness of breath. The symptoms are aggravated by walking. The symptoms are relieved by lying down. He has tried rest for the symptoms. The treatment provided mild relief.    HPI Comments: Travis Morris is a 74 y.o. male who presents to the Emergency Department complaining of moderate generalized weakness onset earlier this evening. Pt states associated HA, diaphoresis, difficulty walking, lightheadedness, one episode of emesis. He states that he was at the beach outside for 4 hours and went inside for lunchtime. He states that he drank tea and had a root beer, but had no water today. Pt states that he got in the car to come home when he began to feel weak. Upon arriving home, he vomited and called EMS. He notes that the HA began after the vomiting. Last urination was at 1630. Pt notes that he was not wearing sunblock, but sat in the shade. Denies diarrhea, constipation, abdominal pain, dysuria, cough, congestion, sore throat, SOB, difficulty speaking, visual disturbance, dizziness. He also denies new medications or medication changes.  Past Medical History  Diagnosis Date  . Prostate cancer   . BPH with urinary obstruction   . Prostate cancer   . Organic impotence   . History of balanitis   . GERD (gastroesophageal reflux  disease)     ESOPHAGEAL REFLUX HX  . History of hypercholesterolemia   . Hypertension   . History of phimosis   . Hx of biopsy     PENIS CUTANEOUS HX  . Macular hole     AIR FLUID EXCHANGE   . History of repair of rotator cuff    Past Surgical History  Procedure Laterality Date  . Appendectomy    . Uvuloplasty      LASER ASSISTED  . Rotator cuff repair     Family History  Problem Relation Age of Onset  . Dementia Father     DIED AGE 52  . Colon cancer Brother     mets to liver died 5    History  Substance Use Topics  . Smoking status: Never Smoker   . Smokeless tobacco: Not on file  . Alcohol Use: Yes     Comment: 3-4 oz gin weekly    Review of Systems  Constitutional: Positive for diaphoresis.  HENT: Positive for tinnitus. Negative for congestion, facial swelling, sore throat and voice change.   Eyes: Negative for photophobia and visual disturbance.  Respiratory: Negative for cough and shortness of breath.   Cardiovascular: Negative for chest pain, palpitations and leg swelling.  Gastrointestinal: Positive for vomiting. Negative for abdominal pain, diarrhea and constipation.  Genitourinary: Negative for dysuria.  Musculoskeletal: Positive for gait problem.  Neurological: Positive for dizziness, weakness and headaches. Negative for seizures, syncope, facial asymmetry, speech difficulty and numbness.  All other  systems reviewed and are negative.  Allergies  Review of patient's allergies indicates no known allergies.  Home Medications   Prior to Admission medications   Medication Sig Start Date End Date Taking? Authorizing Provider  aspirin 81 MG tablet Take 81 mg by mouth daily.      Historical Provider, MD  hydrochlorothiazide (HYDRODIURIL) 25 MG tablet Take 25 mg by mouth daily.      Historical Provider, MD  lisinopril (PRINIVIL,ZESTRIL) 10 MG tablet Take 10 mg by mouth daily.    Historical Provider, MD  Multiple Vitamin (MULTIVITAMINS PO) Take 1 tablet by  mouth daily. CENTRUM SILVER     Historical Provider, MD  nebivolol (BYSTOLIC) 10 MG tablet Take 20 mg by mouth daily.      Historical Provider, MD  omeprazole (PRILOSEC) 20 MG capsule Take 20 mg by mouth daily.      Historical Provider, MD  rosuvastatin (CRESTOR) 10 MG tablet Take 10 mg by mouth daily.      Historical Provider, MD  saw palmetto 80 MG capsule Take 450 mg by mouth 2 (two) times daily.      Historical Provider, MD  vitamin E 400 UNIT capsule Take 400 Units by mouth daily.      Historical Provider, MD   BP 142/72 mmHg  Pulse 54  Temp(Src) 97.7 F (36.5 C) (Oral)  Resp 20  Ht 5\' 8"  (1.727 m)  Wt 202 lb (91.627 kg)  BMI 30.72 kg/m2  SpO2 95% Physical Exam  Constitutional: He is oriented to person, place, and time. He appears well-developed and well-nourished. No distress.  HENT:  Head: Normocephalic and atraumatic.  Mouth/Throat: Oropharynx is clear and moist. No oropharyngeal exudate.  Eyes: Conjunctivae and EOM are normal. Pupils are equal, round, and reactive to light.  Neck: Normal range of motion. Neck supple. No tracheal deviation present.  Cardiovascular: Normal rate, regular rhythm, normal heart sounds and intact distal pulses.  Exam reveals no gallop and no friction rub.   No murmur heard. Pulmonary/Chest: Effort normal and breath sounds normal. No respiratory distress. He has no wheezes. He has no rales.  Lungs CTA.   Abdominal: Soft. Bowel sounds are normal. He exhibits no distension. There is no tenderness. There is no rebound.  Musculoskeletal: Normal range of motion. He exhibits no edema or tenderness.  Neurological: He is alert and oriented to person, place, and time. He has normal reflexes. He displays normal reflexes. No cranial nerve deficit. He exhibits normal muscle tone. Coordination normal.  Cranial nerves 2-12 intact. Not ataxic. Normal gait.  5/5 strength upper extremities. 5/5 strength in the lower extremities.   Skin: Skin is warm and dry.   Psychiatric: He has a normal mood and affect. His behavior is normal.  Nursing note and vitals reviewed.  ED Course  Procedures (including critical care time) DIAGNOSTIC STUDIES: Oxygen Saturation is 95% on RA, adequate by my interpretation.    COORDINATION OF CARE: 11:30 PM Discussed treatment plan with pt at bedside and pt agreed to plan.   Labs Review Labs Reviewed  BASIC METABOLIC PANEL - Abnormal; Notable for the following:    Glucose, Bld 138 (*)    BUN 24 (*)    All other components within normal limits  CBC - Abnormal; Notable for the following:    WBC 14.2 (*)    All other components within normal limits  CBG MONITORING, ED - Abnormal; Notable for the following:    Glucose-Capillary 139 (*)    All other components  within normal limits  URINALYSIS, ROUTINE W REFLEX MICROSCOPIC (NOT AT [facility])    Imaging Review No results found.   EKG Interpretation None     MDM   Final diagnoses:  None    Results for orders placed or performed during the hospital encounter of 03/00/92  Basic metabolic panel  Result Value Ref Range   Sodium 141 135 - 145 mmol/L   Potassium 3.5 3.5 - 5.1 mmol/L   Chloride 106 101 - 111 mmol/L   CO2 26 22 - 32 mmol/L   Glucose, Bld 138 (H) 65 - 99 mg/dL   BUN 24 (H) 6 - 20 mg/dL   Creatinine, Ser 1.06 0.61 - 1.24 mg/dL   Calcium 9.1 8.9 - 10.3 mg/dL   GFR calc non Af Amer >60 >60 mL/min   GFR calc Af Amer >60 >60 mL/min   Anion gap 9 5 - 15  CBC  Result Value Ref Range   WBC 14.2 (H) 4.0 - 10.5 K/uL   RBC 4.66 4.22 - 5.81 MIL/uL   Hemoglobin 15.5 13.0 - 17.0 g/dL   HCT 44.4 39.0 - 52.0 %   MCV 95.3 78.0 - 100.0 fL   MCH 33.3 26.0 - 34.0 pg   MCHC 34.9 30.0 - 36.0 g/dL   RDW 12.3 11.5 - 15.5 %   Platelets 193 150 - 400 K/uL  Urinalysis, Routine w reflex microscopic (not at [facility])  Result Value Ref Range   Color, Urine YELLOW YELLOW   APPearance CLOUDY (A) CLEAR   Specific Gravity, Urine 1.029 1.005 - 1.030   pH 5.5 5.0 - 8.0    Glucose, UA NEGATIVE NEGATIVE mg/dL   Hgb urine dipstick NEGATIVE NEGATIVE   Bilirubin Urine NEGATIVE NEGATIVE   Ketones, ur NEGATIVE NEGATIVE mg/dL   Protein, ur NEGATIVE NEGATIVE mg/dL   Urobilinogen, UA 0.2 0.0 - 1.0 mg/dL   Nitrite NEGATIVE NEGATIVE   Leukocytes, UA NEGATIVE NEGATIVE  Troponin I  Result Value Ref Range   Troponin I <0.03 <0.031 ng/mL  CBG monitoring, ED  Result Value Ref Range   Glucose-Capillary 139 (H) 65 - 99 mg/dL   Comment 1 Notify RN    Dg Chest 2 View  08/24/2014   CLINICAL DATA:  Acute onset of generalized weakness. Vomiting. Initial encounter.  EXAM: CHEST  2 VIEW  COMPARISON:  Chest radiograph performed 11/29/2013  FINDINGS: The lungs are well-aerated. Minimal bibasilar atelectasis is noted. There is no evidence of pleural effusion or pneumothorax.  The heart is normal in size; the mediastinal contour is within normal limits. No acute osseous abnormalities are seen.  IMPRESSION: Minimal bibasilar atelectasis noted; lungs otherwise clear.   Electronically Signed   By: Garald Balding M.D.   On: 08/24/2014 00:13   Ct Head Wo Contrast  08/24/2014   CLINICAL DATA:  Headache with nausea and vomiting.  EXAM: CT HEAD WITHOUT CONTRAST  TECHNIQUE: Contiguous axial images were obtained from the base of the skull through the vertex without intravenous contrast.  COMPARISON:  None.  FINDINGS: There is no intracranial hemorrhage, mass or evidence of acute infarction. There is mild generalized atrophy. There is mild chronic microvascular ischemic change. There is no significant extra-axial fluid collection.  No acute intracranial findings are evident. Paranasal sinuses are clear. No bony abnormality.  IMPRESSION: Mild atrophy and chronic microvascular changes. No acute intracranial finding.   Electronically Signed   By: Andreas Newport M.D.   On: 08/24/2014 00:11    Medications  metoCLOPramide (REGLAN) injection  10 mg (not administered)  diphenhydrAMINE (BENADRYL) injection  12.5 mg (not administered)  sodium chloride 0.9 % bolus 1,000 mL (not administered)  sodium chloride 0.9 % bolus 1,000 mL (0 mLs Intravenous Stopped 08/24/14 0108)  meclizine (ANTIVERT) tablet 12.5 mg (12.5 mg Oral Given 08/24/14 0018)  ketorolac (TORADOL) 30 MG/ML injection 30 mg (30 mg Intravenous Given 08/24/14 0026)     I personally performed the services described in this documentation, which was scribed in my presence. The recorded information has been reviewed and is accurate.    Veatrice Kells, MD 08/24/14 0157

## 2014-08-23 NOTE — ED Notes (Signed)
Pt in CT.

## 2014-08-24 ENCOUNTER — Encounter (HOSPITAL_COMMUNITY): Payer: Self-pay | Admitting: Emergency Medicine

## 2014-08-24 ENCOUNTER — Observation Stay (HOSPITAL_BASED_OUTPATIENT_CLINIC_OR_DEPARTMENT_OTHER): Payer: Medicare Other

## 2014-08-24 ENCOUNTER — Emergency Department (HOSPITAL_COMMUNITY): Payer: Medicare Other

## 2014-08-24 ENCOUNTER — Observation Stay (HOSPITAL_COMMUNITY): Payer: Medicare Other

## 2014-08-24 ENCOUNTER — Other Ambulatory Visit (HOSPITAL_COMMUNITY): Payer: PRIVATE HEALTH INSURANCE

## 2014-08-24 DIAGNOSIS — R42 Dizziness and giddiness: Secondary | ICD-10-CM

## 2014-08-24 DIAGNOSIS — G459 Transient cerebral ischemic attack, unspecified: Secondary | ICD-10-CM

## 2014-08-24 DIAGNOSIS — R1110 Vomiting, unspecified: Secondary | ICD-10-CM | POA: Diagnosis not present

## 2014-08-24 DIAGNOSIS — J9811 Atelectasis: Secondary | ICD-10-CM | POA: Diagnosis not present

## 2014-08-24 DIAGNOSIS — E780 Pure hypercholesterolemia: Secondary | ICD-10-CM | POA: Diagnosis not present

## 2014-08-24 DIAGNOSIS — Z8639 Personal history of other endocrine, nutritional and metabolic disease: Secondary | ICD-10-CM

## 2014-08-24 DIAGNOSIS — R531 Weakness: Secondary | ICD-10-CM | POA: Diagnosis not present

## 2014-08-24 DIAGNOSIS — C61 Malignant neoplasm of prostate: Secondary | ICD-10-CM | POA: Diagnosis not present

## 2014-08-24 DIAGNOSIS — E785 Hyperlipidemia, unspecified: Secondary | ICD-10-CM | POA: Diagnosis not present

## 2014-08-24 DIAGNOSIS — R001 Bradycardia, unspecified: Secondary | ICD-10-CM | POA: Diagnosis present

## 2014-08-24 DIAGNOSIS — R51 Headache: Secondary | ICD-10-CM | POA: Diagnosis not present

## 2014-08-24 DIAGNOSIS — I10 Essential (primary) hypertension: Secondary | ICD-10-CM | POA: Diagnosis not present

## 2014-08-24 DIAGNOSIS — R112 Nausea with vomiting, unspecified: Secondary | ICD-10-CM | POA: Diagnosis not present

## 2014-08-24 LAB — URINALYSIS, ROUTINE W REFLEX MICROSCOPIC
BILIRUBIN URINE: NEGATIVE
Glucose, UA: NEGATIVE mg/dL
HGB URINE DIPSTICK: NEGATIVE
KETONES UR: NEGATIVE mg/dL
Leukocytes, UA: NEGATIVE
NITRITE: NEGATIVE
PH: 5.5 (ref 5.0–8.0)
PROTEIN: NEGATIVE mg/dL
SPECIFIC GRAVITY, URINE: 1.029 (ref 1.005–1.030)
UROBILINOGEN UA: 0.2 mg/dL (ref 0.0–1.0)

## 2014-08-24 LAB — TROPONIN I
Troponin I: <0.03 ng/mL (ref <0.031)
Troponin I: <0.03 ng/mL (ref <0.031)

## 2014-08-24 LAB — BASIC METABOLIC PANEL
Anion gap: 4 — ABNORMAL LOW (ref 5–15)
BUN: 23 mg/dL — AB (ref 6–20)
CO2: 27 mmol/L (ref 22–32)
Calcium: 8.1 mg/dL — ABNORMAL LOW (ref 8.9–10.3)
Chloride: 110 mmol/L (ref 101–111)
Creatinine, Ser: 1.17 mg/dL (ref 0.61–1.24)
GFR calc non Af Amer: 59 mL/min — ABNORMAL LOW (ref >60)
Glucose, Bld: 142 mg/dL — ABNORMAL HIGH (ref 65–99)
Potassium: 3.6 mmol/L (ref 3.5–5.1)
Sodium: 141 mmol/L (ref 135–145)

## 2014-08-24 LAB — CBC
HCT: 38.4 % — ABNORMAL LOW (ref 39.0–52.0)
Hemoglobin: 12.7 g/dL — ABNORMAL LOW (ref 13.0–17.0)
MCH: 31.5 pg (ref 26.0–34.0)
MCHC: 33.1 g/dL (ref 30.0–36.0)
MCV: 95.3 fL (ref 78.0–100.0)
Platelets: 156 10*3/uL (ref 150–400)
RBC: 4.03 MIL/uL — ABNORMAL LOW (ref 4.22–5.81)
RDW: 12.3 % (ref 11.5–15.5)
WBC: 8.4 10*3/uL (ref 4.0–10.5)

## 2014-08-24 LAB — LIPID PANEL
CHOL/HDL RATIO: 4.1 ratio
Cholesterol: 99 mg/dL (ref 0–200)
HDL: 24 mg/dL — ABNORMAL LOW (ref >40)
LDL Cholesterol: 64 mg/dL (ref 0–99)
Triglycerides: 54 mg/dL (ref <150)
VLDL: 11 mg/dL (ref 0–40)

## 2014-08-24 MED ORDER — SENNOSIDES-DOCUSATE SODIUM 8.6-50 MG PO TABS: 1.0000 | ORAL_TABLET | Freq: Every evening | ORAL | Status: DC | PRN

## 2014-08-24 MED ORDER — SODIUM CHLORIDE 0.9 % IV BOLUS (SEPSIS)
1000.0000 mL | Freq: Once | INTRAVENOUS | Status: AC
  Administered 2014-08-24: 1000 mL via INTRAVENOUS

## 2014-08-24 MED ORDER — NITROGLYCERIN 0.4 MG SL SUBL
SUBLINGUAL_TABLET | SUBLINGUAL | Status: AC
  Administered 2014-08-24: 0.4 mg
  Filled 2014-08-24: qty 1

## 2014-08-24 MED ORDER — DIPHENHYDRAMINE HCL 50 MG/ML IJ SOLN
12.5000 mg | Freq: Once | INTRAMUSCULAR | Status: AC
  Administered 2014-08-24: 12.5 mg via INTRAVENOUS
  Filled 2014-08-24: qty 1

## 2014-08-24 MED ORDER — ADULT MULTIVITAMIN W/MINERALS CH
1.0000 | ORAL_TABLET | Freq: Every day | ORAL | Status: DC
  Administered 2014-08-24 – 2014-08-25 (×2): 1 via ORAL
  Filled 2014-08-24 (×2): qty 1

## 2014-08-24 MED ORDER — ACETAMINOPHEN 325 MG PO TABS: 650.0000 mg | ORAL_TABLET | Freq: Four times a day (QID) | ORAL | Status: DC | PRN

## 2014-08-24 MED ORDER — SODIUM CHLORIDE 0.9 % IJ SOLN
3.0000 mL | Freq: Two times a day (BID) | INTRAMUSCULAR | Status: DC
  Administered 2014-08-25: 3 mL via INTRAVENOUS

## 2014-08-24 MED ORDER — STROKE: EARLY STAGES OF RECOVERY BOOK
Freq: Once | Status: DC
  Filled 2014-08-24: qty 1

## 2014-08-24 MED ORDER — SODIUM CHLORIDE 0.9 % IV SOLN
INTRAVENOUS | Status: DC
  Administered 2014-08-25: 06:00:00 via INTRAVENOUS

## 2014-08-24 MED ORDER — PANTOPRAZOLE SODIUM 40 MG PO TBEC
40.0000 mg | DELAYED_RELEASE_TABLET | Freq: Every day | ORAL | Status: DC
  Administered 2014-08-24 – 2014-08-25 (×2): 40 mg via ORAL
  Filled 2014-08-24 (×2): qty 1

## 2014-08-24 MED ORDER — ACETAMINOPHEN 650 MG RE SUPP: 650.0000 mg | Freq: Four times a day (QID) | RECTAL | Status: DC | PRN

## 2014-08-24 MED ORDER — ASPIRIN 300 MG RE SUPP
300.0000 mg | Freq: Every day | RECTAL | Status: DC
Start: 2014-08-24 — End: 2014-08-25
  Filled 2014-08-24 (×2): qty 1

## 2014-08-24 MED ORDER — ONDANSETRON HCL 4 MG/2ML IJ SOLN: 4.0000 mg | Freq: Four times a day (QID) | INTRAMUSCULAR | Status: DC | PRN

## 2014-08-24 MED ORDER — KETOROLAC TROMETHAMINE 30 MG/ML IJ SOLN
30.0000 mg | Freq: Once | INTRAMUSCULAR | Status: AC
  Administered 2014-08-24: 30 mg via INTRAVENOUS
  Filled 2014-08-24: qty 1

## 2014-08-24 MED ORDER — ALUM & MAG HYDROXIDE-SIMETH 200-200-20 MG/5ML PO SUSP: 30.0000 mL | Freq: Four times a day (QID) | ORAL | Status: DC | PRN

## 2014-08-24 MED ORDER — ASPIRIN 325 MG PO TABS
325.0000 mg | ORAL_TABLET | Freq: Every day | ORAL | Status: DC
  Administered 2014-08-24 – 2014-08-25 (×2): 325 mg via ORAL
  Filled 2014-08-24 (×2): qty 1

## 2014-08-24 MED ORDER — ROSUVASTATIN CALCIUM 10 MG PO TABS
10.0000 mg | ORAL_TABLET | Freq: Every day | ORAL | Status: DC
  Administered 2014-08-24 – 2014-08-25 (×2): 10 mg via ORAL
  Filled 2014-08-24 (×2): qty 1

## 2014-08-24 MED ORDER — HYDROMORPHONE HCL 1 MG/ML IJ SOLN
0.5000 mg | INTRAMUSCULAR | Status: DC | PRN
  Administered 2014-08-24: 1 mg via INTRAVENOUS
  Filled 2014-08-24: qty 1

## 2014-08-24 MED ORDER — OXYCODONE HCL 5 MG PO TABS: 5.0000 mg | ORAL_TABLET | ORAL | Status: DC | PRN

## 2014-08-24 MED ORDER — ENOXAPARIN SODIUM 40 MG/0.4ML ~~LOC~~ SOLN
40.0000 mg | SUBCUTANEOUS | Status: DC
  Administered 2014-08-24 – 2014-08-25 (×2): 40 mg via SUBCUTANEOUS
  Filled 2014-08-24 (×2): qty 0.4

## 2014-08-24 MED ORDER — ASPIRIN 81 MG PO TABS: 81.0000 mg | ORAL_TABLET | Freq: Every day | ORAL | Status: DC

## 2014-08-24 MED ORDER — METOCLOPRAMIDE HCL 5 MG/ML IJ SOLN
10.0000 mg | Freq: Once | INTRAMUSCULAR | Status: AC
  Administered 2014-08-24: 10 mg via INTRAVENOUS
  Filled 2014-08-24: qty 2

## 2014-08-24 MED ORDER — ONDANSETRON HCL 4 MG PO TABS: 4.0000 mg | ORAL_TABLET | Freq: Four times a day (QID) | ORAL | Status: DC | PRN

## 2014-08-24 NOTE — Progress Notes (Signed)
8:12 AM I agree with HPI/GPe and A/P per Dr. Arnoldo Morale      74 y/o male Prostate Ca T1C, Gleason 7 AdenoCa ,  Prior tia hld htn  Admitted to [facility] with vertigo--given h/o TIA/CVA full neuro work-up performed  Was at [facility] for ~ 4 hrs Had lunch and felt woozy Had been in the sun ~ 2 hrs No cp no sob Feels "dizzy" [cannot qualify further what makes him better/worse] No nystagmus No double nor blurred vision  Dizzy for a short period of time after standing No cp   Patient Active Problem List   Diagnosis Date Noted  . Vertigo 08/24/2014  . History of hypercholesterolemia 08/24/2014  . Sinus bradycardia 08/24/2014  . TIA (transient ischemic attack)   . Prostate cancer    As per Dr. Arnoldo Morale No m heard on exam-unlikely valvular pathology-Await echo MRI neg for any intra-cranial pathology Carotids pending ?2/2 to ACe and HCTZ and Nevibolol? Obtain orthostatic vitals If all work-up neg, might d/c in 24 hours to home with lower dose meds  Verneita Griffes, MD [facility] [phone]

## 2014-08-24 NOTE — H&P (Signed)
[facility] Admission History and Physical       TALIESIN HARTLAGE HQI:[id number] DOB: Nov 01, 1940 DOA: 08/23/2014    Referring physician: EDP PCP: Patria Mane, MD  Specialists:   Chief Complaint: Headache and Dizziness  HPI: Travis Morris is a 74 y.o. male with a history of HTN, Hyperlipidemia, Prostate Cancer who presents to the ED with complaints of persistent Dizziness and Headache that started earlier in the day  While he was at the beach.  He thought he may have been having heat stroke symptoms, but he denies any feeling of being overheated, he reports feeling nauseous and vomiting x 1. He reports having a frontal area headache and rates the pain at a 6/10.    He denies any chest pain or fevers or chills.  He was evaluated in the ED and a CT scan of the head was performed and was negative for acute findings.   Orthostatics were performed and were also negative.   He was referred for admission.     Review of Systems: Constitutional: No Weight Loss, No Weight Gain, Night Sweats, Fevers, Chills, Dizziness, Light Headedness, Fatigue, or Generalized Weakness HEENT:  +Headaches, Difficulty Swallowing,Tooth/Dental Problems,Sore Throat,  No Sneezing, Rhinitis, Ear Ache, Nasal Congestion, or Post Nasal Drip,  Cardio-vascular:  No Chest pain, Orthopnea, PND, Edema in Lower Extremities, Anasarca, Dizziness, Palpitations  Resp: No Dyspnea, No DOE, No Productive Cough, No Non-Productive Cough, No Hemoptysis, No Wheezing.    GI: No Heartburn, Indigestion, Abdominal Pain, Nausea, Vomiting, Diarrhea, Constipation, Hematemesis, Hematochezia, Melena, Change in Bowel Habits,  Loss of Appetite  GU: No Dysuria, No Change in Color of Urine, No Urgency or Urinary Frequency, No Flank pain.  Musculoskeletal: No Joint Pain or Swelling, No Decreased Range of Motion, No Back Pain.  Neurologic: No Syncope, No Seizures, Muscle Weakness, Paresthesia, Vision Disturbance or Loss, No Diplopia, +Vertigo, No  Difficulty Walking,  Skin: No Rash or Lesions. Psych: No Change in Mood or Affect, No Depression or Anxiety, No Memory loss, No Confusion, or Hallucinations   Past Medical History  Diagnosis Date  . Prostate cancer   . BPH with urinary obstruction   . Prostate cancer   . Organic impotence   . History of balanitis   . GERD (gastroesophageal reflux disease)     ESOPHAGEAL REFLUX HX  . History of hypercholesterolemia   . Hypertension   . History of phimosis   . Hx of biopsy     PENIS CUTANEOUS HX  . Macular hole     AIR FLUID EXCHANGE   . History of repair of rotator cuff      Past Surgical History  Procedure Laterality Date  . Appendectomy    . Uvuloplasty      LASER ASSISTED  . Rotator cuff repair        Prior to Admission medications   Medication Sig Start Date End Date Taking? Authorizing Provider  aspirin 81 MG tablet Take 81 mg by mouth daily.      Historical Provider, MD  hydrochlorothiazide (HYDRODIURIL) 25 MG tablet Take 25 mg by mouth daily.      Historical Provider, MD  lisinopril (PRINIVIL,ZESTRIL) 10 MG tablet Take 10 mg by mouth daily.    Historical Provider, MD  Multiple Vitamin (MULTIVITAMINS PO) Take 1 tablet by mouth daily. CENTRUM SILVER     Historical Provider, MD  nebivolol (BYSTOLIC) 10 MG tablet Take 20 mg by mouth daily.      Historical Provider, MD  omeprazole (PRILOSEC) 20  MG capsule Take 20 mg by mouth daily.      Historical Provider, MD  rosuvastatin (CRESTOR) 10 MG tablet Take 10 mg by mouth daily.      Historical Provider, MD  saw palmetto 80 MG capsule Take 450 mg by mouth 2 (two) times daily.      Historical Provider, MD  vitamin E 400 UNIT capsule Take 400 Units by mouth daily.      Historical Provider, MD     No Known Allergies  Social History:  reports that he has never smoked. He does not have any smokeless tobacco history on file. He reports that he drinks alcohol. He reports that he does not use illicit drugs.    Family History    Problem Relation Age of Onset  . Dementia Father     DIED AGE 65  . Colon cancer Brother     mets to liver died 71        Physical Exam:  GEN:  Pleasant Well Nourished and Well Developed Elderly 74 y.o. Caucasian male examined and in no acute distress; cooperative with exam Filed Vitals:   08/23/14 2334 08/23/14 2336 08/23/14 2337 08/24/14 0029  BP: 154/63 147/59 151/66 133/58  Pulse: 62  66 57  Temp:    97.5 F (36.4 C)  TempSrc:    Oral  Resp: 18  19 18   Height:      Weight:      SpO2:    94%   Blood pressure 133/58, pulse 57, temperature 97.5 F (36.4 C), temperature source Oral, resp. rate 18, height 5\' 8"  (1.727 m), weight 91.627 kg (202 lb), SpO2 94 %. PSYCH: He is alert and oriented x4; does not appear anxious does not appear depressed; affect is normal HEENT: Normocephalic and Atraumatic, Mucous membranes pink; PERRLA; EOM intact; Fundi:  Benign;  No scleral icterus, Nares: Patent, Oropharynx: Clear, Fair Dentition,    Neck:  FROM, No Cervical Lymphadenopathy nor Thyromegaly or Carotid Bruit; No JVD; Breasts:: Not examined CHEST WALL: No tenderness CHEST: Normal respiration, clear to auscultation bilaterally HEART: Regular rate and rhythm; no murmurs rubs or gallops BACK: No kyphosis or scoliosis; No CVA tenderness ABDOMEN: Positive Bowel Sounds, Soft Non-Tender, No Rebound or Guarding; No Masses, No Organomegaly.   Rectal Exam: Not done EXTREMITIES: No Cyanosis, Clubbing, or Edema; No Ulcerations. Genitalia: not examined PULSES: 2+ and symmetric SKIN: Normal hydration no rash or ulceration CNS:  Alert and Oriented x 4, No Focal Deficits Vascular: pulses palpable throughout    Labs on Admission:  Basic Metabolic Panel:  Recent Labs Lab 08/23/14 2242  NA 141  K 3.5  CL 106  CO2 26  GLUCOSE 138*  BUN 24*  CREATININE 1.06  CALCIUM 9.1   Liver Function Tests: No results for input(s): AST, ALT, ALKPHOS, BILITOT, PROT, ALBUMIN in the last 168 hours. No  results for input(s): LIPASE, AMYLASE in the last 168 hours. No results for input(s): AMMONIA in the last 168 hours. CBC:  Recent Labs Lab 08/23/14 2242  WBC 14.2*  HGB 15.5  HCT 44.4  MCV 95.3  PLT 193   Cardiac Enzymes:  Recent Labs Lab 08/23/14 2242  TROPONINI <0.03    BNP (last 3 results) No results for input(s): BNP in the last 8760 hours.  ProBNP (last 3 results) No results for input(s): PROBNP in the last 8760 hours.  CBG:  Recent Labs Lab 08/23/14 2236  GLUCAP 139*    Radiological Exams on Admission: Dg Chest 2  View  08/24/2014   CLINICAL DATA:  Acute onset of generalized weakness. Vomiting. Initial encounter.  EXAM: CHEST  2 VIEW  COMPARISON:  Chest radiograph performed 11/29/2013  FINDINGS: The lungs are well-aerated. Minimal bibasilar atelectasis is noted. There is no evidence of pleural effusion or pneumothorax.  The heart is normal in size; the mediastinal contour is within normal limits. No acute osseous abnormalities are seen.  IMPRESSION: Minimal bibasilar atelectasis noted; lungs otherwise clear.   Electronically Signed   By: Garald Balding M.D.   On: 08/24/2014 00:13   Ct Head Wo Contrast  08/24/2014   CLINICAL DATA:  Headache with nausea and vomiting.  EXAM: CT HEAD WITHOUT CONTRAST  TECHNIQUE: Contiguous axial images were obtained from the base of the skull through the vertex without intravenous contrast.  COMPARISON:  None.  FINDINGS: There is no intracranial hemorrhage, mass or evidence of acute infarction. There is mild generalized atrophy. There is mild chronic microvascular ischemic change. There is no significant extra-axial fluid collection.  No acute intracranial findings are evident. Paranasal sinuses are clear. No bony abnormality.  IMPRESSION: Mild atrophy and chronic microvascular changes. No acute intracranial finding.   Electronically Signed   By: Andreas Newport M.D.   On: 08/24/2014 00:11     EKG: Independently reviewed. Sinus  Bradycardia rate =56 No Acute S-t changes   Assessment/Plan:   74 y.o. male with  Principal Problem:   1.    Vertigo- rule out TIA/CVA   MRI/MRA of Brain ordered   Carotid US in AM   2D ECHO in AM    Check Fasting Lipids and HbA1C   ASA Rx    Active Problems:   2.    Sinus bradycardia   Cardiac monitoring   Hold Beta-Blocker Rx     3.    Prostate cancer   Hx     4.    History of hypercholesterolemia   Continue Crestor Rx        5.    DVT Prophylaxis   Lovenox          Code Status:     FULL CODE       Family Communication:    No Family Present    Disposition Plan:   Observation Status        Time spent:  [place] [facility] Pager [phone]   If [place] Please Contact the Day Rounding Team MD for [facility]  If 7PM-7AM, Please Contact Night-Floor Coverage  [email or web address] Password [email or web address] 08/24/2014, 2:43 AM     ADDENDUM:   Patient was seen and examined on 08/24/2014

## 2014-08-24 NOTE — ED Notes (Signed)
Attempted to call report. No answer.

## 2014-08-24 NOTE — Progress Notes (Signed)
  Echocardiogram 2D Echocardiogram has been performed.  [name] 08/24/2014, 2:01 PM

## 2014-08-24 NOTE — Progress Notes (Signed)
Pt alert x4, c/o chest pain, relieved  by1 sublingual Nitroglycerin and  IV pain med's given, EKG done SB HR51, 111/54, 97.8, Kathline Magic NP made aware will continue to monitor. Jeanie Sewer, RN 7:16 AM 08/24/2014

## 2014-08-24 NOTE — ED Notes (Signed)
Pt reports his headache is not any better. He states " I think I will just go home". Pt informed that MD Palumbo will be in to review results as soon as possible.

## 2014-08-24 NOTE — ED Notes (Signed)
Pt ambulated in room with unsteady gait. Pt reports he feels unsteady.

## 2014-08-24 NOTE — Progress Notes (Signed)
[facility] progress note. Chief complaint. Chest pain. History of present illness. This 74 year old male admitted with headache and dizziness felt to be secondary to vertigo. Patient on TIA/CVA rule out tract. Patient complained to nursing of chest pain under the left breast. Patient to was given one sublingual nitroglycerin with pain relief. 12-lead EKG was obtained and I reviewed this. The EKG does not look significantly different from prior EKG and does not look acutely ischemic. I came to see the patient at bedside and he confirms that he is now chest pain-free. When the patient was experiencing pain there was no radiation, no diaphoresis, or no associated nausea. Physical exam. Vital signs. Temperature 98.8, pulse 51, respiration 18, blood pressure 111/54. O2 sats 98%. General appearance. Well-developed elderly male who is alert and in no distress. Cardiac. Rate and rhythm regular. Lungs. Breath sounds clear and equal. Abdomen. Soft with positive bowel sounds. Impression/plan. Problem #1. Chest pain. Patient now chest pain-free status post one sublingual nitroglycerin. The patient has no listed history of coronary artery disease. EKG appears unchanged and nonischemic. We'll follow with troponin now and every 6 hours for a total of 3 sets. We'll repeat an EKG in 6 hours to evaluate for any changes.

## 2014-08-24 NOTE — ED Notes (Signed)
MD Jenkins at bedside.  

## 2014-08-24 NOTE — ED Notes (Signed)
Pt reports feeling restless and left ear is burning.

## 2014-08-25 ENCOUNTER — Ambulatory Visit (HOSPITAL_BASED_OUTPATIENT_CLINIC_OR_DEPARTMENT_OTHER): Payer: Medicare Other

## 2014-08-25 DIAGNOSIS — R001 Bradycardia, unspecified: Secondary | ICD-10-CM | POA: Diagnosis not present

## 2014-08-25 DIAGNOSIS — R42 Dizziness and giddiness: Secondary | ICD-10-CM

## 2014-08-25 DIAGNOSIS — E780 Pure hypercholesterolemia: Secondary | ICD-10-CM | POA: Diagnosis not present

## 2014-08-25 DIAGNOSIS — C61 Malignant neoplasm of prostate: Secondary | ICD-10-CM | POA: Diagnosis not present

## 2014-08-25 LAB — HEMOGLOBIN A1C
HEMOGLOBIN A1C: 6.2 % — AB (ref 4.8–5.6)
MEAN PLASMA GLUCOSE: 131 mg/dL

## 2014-08-25 LAB — MAGNESIUM: Magnesium: 1.9 mg/dL (ref 1.7–2.4)

## 2014-08-25 MED ORDER — NEBIVOLOL HCL 2.5 MG PO TABS: 2.5000 mg | ORAL_TABLET | Freq: Every day | ORAL | Status: DC

## 2014-08-25 MED ORDER — NEBIVOLOL HCL 2.5 MG PO TABS
2.5000 mg | ORAL_TABLET | Freq: Every day | ORAL | Status: DC
Start: 2014-08-25 — End: 2014-08-25
  Administered 2014-08-25: 2.5 mg via ORAL
  Filled 2014-08-25 (×2): qty 1

## 2014-08-25 NOTE — Progress Notes (Signed)
Bilateral carotid artery duplex completed:  1-39% ICA stenosis.  Vertebral artery flow is antegrade.     

## 2014-08-25 NOTE — Discharge Summary (Addendum)
Physician Discharge Summary  Travis Morris FIE:[id number] DOB: 12-Aug-1940 DOA: 08/23/2014  PCP: No primary care provider on file.  Admit date: 08/23/2014 Discharge date: 08/25/2014  Time spent: 35 minutes  Recommendations for Outpatient Follow-up:  1. Have held the following meds which can be re-implmeeneted per PCP and patient BP and pulse reading in ambulatory state Nevibolol 10 bid-->2.5 mg qday HCTZ Lisinopril  2. Recommend ? referral to cardiology if HR persistently in 50 range and if also patinet has further recurrence of syncope -Telemetry did not show any pauses or dysryhtmia on hospital stay if discharging to SNF, indicate specific facility 3. Need Bmet in 1 week 4. Recommend ear checks and screening for BPV if symptoms persist-in hospital his epley was neg  Discharge Diagnoses:  Principal Problem:   Vertigo Active Problems:   Prostate cancer   History of hypercholesterolemia   Sinus bradycardia   Discharge Condition: good  Diet recommendation: HH low salt  Filed Weights   08/23/14 2210 08/24/14 0302  Weight: 91.627 kg (202 lb) 94.6 kg (208 lb 8.9 oz)    History of present illness:  74 y/o male Prostate Ca T1C, Gleason 7 AdenoCa ,  Prior tia hld htn  Admitted to [facility] with vertigo--given h/o TIA/CVA full neuro work-up performed NO CVA THIS ADMISSION  Was at [employer] for ~ 4 hrs Had lunch and felt woozy Had been in the sun ~ 2 hrs No cp no sob Feels "dizzy" [cannot qualify further what makes him better/worse] No nystagmus No double nor blurred vision  HE was observed overnight and noted to have a low but non-pathological bradycardia without pauses His Nevibolol was restarted at a much lower dose for Tremor as well as for occasional high HR which was nonsustained.   Magnesium level was 1.9 He had no further CP nor weakness He does have an essential tremor which he has had for a long time  ambulated well during hosptial stay    had some Acute kidney  injury and was given IV saline and this resolved  His symptoms were felt to be 2/2 to orthostatism and volume depletion and as such his anti-htn agents were d/c and patient was d/c home   Discharge Exam: Filed Vitals:   08/25/14 0540  BP: 113/49  Pulse: 51  Temp: 97.8 F (36.6 C)  Resp: 18    General: alert pleasant oriented in nad Cardiovascular: s1 s2 no m/r/g Respiratory: clear no added  sound  Discharge Instructions   Discharge Instructions    Diet - low sodium heart healthy    Complete by:  As directed      Discharge instructions    Complete by:  As directed   Stop lisinopril, hctz and nevibolol for now Dr. Orland Mustard should re-assess your trends with your home blood pressure cuff and take the measurement q day as well as yuour pulse If you find that your pulse is persistently low, I would recommend Dr. Orland Mustard refer you to a specialist like a cardiologist for this but nothing acute and we do not think you had any coronary issues this admission We felt that you had dehydration probably because of being out in the sun and taking a diuretic and blood ptresure meds     Increase activity slowly    Complete by:  As directed           Current Discharge Medication List    CONTINUE these medications which have NOT CHANGED   Details  aspirin 81  MG tablet Take 81 mg by mouth daily.      Multiple Vitamin (MULTIVITAMINS PO) Take 1 tablet by mouth daily. CENTRUM SILVER     omeprazole (PRILOSEC) 20 MG capsule Take 20 mg by mouth daily.      rosuvastatin (CRESTOR) 10 MG tablet Take 10 mg by mouth daily.      saw palmetto 80 MG capsule Take 450 mg by mouth 2 (two) times daily.      vitamin E 400 UNIT capsule Take 400 Units by mouth daily.        STOP taking these medications     hydrochlorothiazide (HYDRODIURIL) 25 MG tablet      lisinopril (PRINIVIL,ZESTRIL) 10 MG tablet      nebivolol (BYSTOLIC) 10 MG tablet        No Known Allergies    The results of significant  diagnostics from this hospitalization (including imaging, microbiology, ancillary and laboratory) are listed below for reference.    Significant Diagnostic Studies: Dg Chest 2 View  08/24/2014   CLINICAL DATA:  Acute onset of generalized weakness. Vomiting. Initial encounter.  EXAM: CHEST  2 VIEW  COMPARISON:  Chest radiograph performed 11/29/2013  FINDINGS: The lungs are well-aerated. Minimal bibasilar atelectasis is noted. There is no evidence of pleural effusion or pneumothorax.  The heart is normal in size; the mediastinal contour is within normal limits. No acute osseous abnormalities are seen.  IMPRESSION: Minimal bibasilar atelectasis noted; lungs otherwise clear.   Electronically Signed   By: Garald Balding M.D.   On: 08/24/2014 00:13   Ct Head Wo Contrast  08/24/2014   CLINICAL DATA:  Headache with nausea and vomiting.  EXAM: CT HEAD WITHOUT CONTRAST  TECHNIQUE: Contiguous axial images were obtained from the base of the skull through the vertex without intravenous contrast.  COMPARISON:  None.  FINDINGS: There is no intracranial hemorrhage, mass or evidence of acute infarction. There is mild generalized atrophy. There is mild chronic microvascular ischemic change. There is no significant extra-axial fluid collection.  No acute intracranial findings are evident. Paranasal sinuses are clear. No bony abnormality.  IMPRESSION: Mild atrophy and chronic microvascular changes. No acute intracranial finding.   Electronically Signed   By: Andreas Newport M.D.   On: 08/24/2014 00:11   Mr Virgel Paling Wo Contrast  08/24/2014   CLINICAL DATA:  Hypertension, hyperlipidemia, and prostate cancer. Patient presents to the emergency department with symptoms of persistent dizziness headache for 1 day. Nausea and vomiting.  EXAM: MRI HEAD WITHOUT CONTRAST  MRA HEAD WITHOUT CONTRAST  TECHNIQUE: Multiplanar, multiecho pulse sequences of the brain and surrounding structures were obtained without intravenous contrast.  Angiographic images of the head were obtained using MRA technique without contrast.  COMPARISON:  CT head 08/23/2014.  FINDINGS: MRI HEAD FINDINGS  No evidence for acute infarction, hemorrhage, mass lesion, hydrocephalus, or extra-axial fluid. Generalized cerebral cerebellar atrophy. Mild subcortical and periventricular T2 and FLAIR hyperintensities, likely chronic microvascular ischemic change.  Pituitary, pineal, and cerebellar tonsils unremarkable. No upper cervical lesions.  Flow voids are maintained throughout the carotid, basilar, and vertebral arteries. There are no areas of chronic hemorrhage.  Visualized calvarium, skull base, and upper cervical osseous structures unremarkable. Scalp and extracranial soft tissues, orbits, sinuses, and mastoids show no acute process.  MRA HEAD FINDINGS  The internal carotid arteries are dolichoectatic but widely patent. The basilar artery is widely patent. RIGHT vertebral dominant. Slight narrowing distal LEFT vertebral artery at the basilar confluence. No intracranial stenosis or aneurysm.  IMPRESSION: Generalized atrophy. Mild small vessel disease. No acute intracranial findings.  No intracranial flow reducing lesion or vascular anomaly.   Electronically Signed   By: Staci Righter M.D.   On: 08/24/2014 11:09   Mr Brain Wo Contrast  08/24/2014   CLINICAL DATA:  Hypertension, hyperlipidemia, and prostate cancer. Patient presents to the emergency department with symptoms of persistent dizziness headache for 1 day. Nausea and vomiting.  EXAM: MRI HEAD WITHOUT CONTRAST  MRA HEAD WITHOUT CONTRAST  TECHNIQUE: Multiplanar, multiecho pulse sequences of the brain and surrounding structures were obtained without intravenous contrast. Angiographic images of the head were obtained using MRA technique without contrast.  COMPARISON:  CT head 08/23/2014.  FINDINGS: MRI HEAD FINDINGS  No evidence for acute infarction, hemorrhage, mass lesion, hydrocephalus, or extra-axial fluid.  Generalized cerebral cerebellar atrophy. Mild subcortical and periventricular T2 and FLAIR hyperintensities, likely chronic microvascular ischemic change.  Pituitary, pineal, and cerebellar tonsils unremarkable. No upper cervical lesions.  Flow voids are maintained throughout the carotid, basilar, and vertebral arteries. There are no areas of chronic hemorrhage.  Visualized calvarium, skull base, and upper cervical osseous structures unremarkable. Scalp and extracranial soft tissues, orbits, sinuses, and mastoids show no acute process.  MRA HEAD FINDINGS  The internal carotid arteries are dolichoectatic but widely patent. The basilar artery is widely patent. RIGHT vertebral dominant. Slight narrowing distal LEFT vertebral artery at the basilar confluence. No intracranial stenosis or aneurysm.  IMPRESSION: Generalized atrophy. Mild small vessel disease. No acute intracranial findings.  No intracranial flow reducing lesion or vascular anomaly.   Electronically Signed   By: Staci Righter M.D.   On: 08/24/2014 11:09    Microbiology: No results found for this or any previous visit (from the past 240 hour(s)).   Labs: Basic Metabolic Panel:  Recent Labs Lab 08/23/14 2242 08/24/14 0541  NA 141 141  K 3.5 3.6  CL 106 110  CO2 26 27  GLUCOSE 138* 142*  BUN 24* 23*  CREATININE 1.06 1.17  CALCIUM 9.1 8.1*   Liver Function Tests: No results for input(s): AST, ALT, ALKPHOS, BILITOT, PROT, ALBUMIN in the last 168 hours. No results for input(s): LIPASE, AMYLASE in the last 168 hours. No results for input(s): AMMONIA in the last 168 hours. CBC:  Recent Labs Lab 08/23/14 2242 08/24/14 0541  WBC 14.2* 8.4  HGB 15.5 12.7*  HCT 44.4 38.4*  MCV 95.3 95.3  PLT 193 156   Cardiac Enzymes:  Recent Labs Lab 08/23/14 2242 08/24/14 0541  TROPONINI <0.03 <0.03   BNP: BNP (last 3 results) No results for input(s): BNP in the last 8760 hours.  ProBNP (last 3 results) No results for input(s):  PROBNP in the last 8760 hours.  CBG:  Recent Labs Lab 08/23/14 2236  GLUCAP 139*       SignedNita Sells  [facility] 08/25/2014, 10:56 AM

## 2014-08-25 NOTE — Progress Notes (Signed)
MD notified, pt tele showing abnormal rhythm. MD acknowledged and orders placed. No further events. Pt VSS and ready for DC. Kizzie Ide, RN

## 2014-08-28 DIAGNOSIS — R42 Dizziness and giddiness: Secondary | ICD-10-CM | POA: Diagnosis not present

## 2014-08-28 DIAGNOSIS — R7309 Other abnormal glucose: Secondary | ICD-10-CM | POA: Diagnosis not present

## 2014-08-28 DIAGNOSIS — I10 Essential (primary) hypertension: Secondary | ICD-10-CM | POA: Diagnosis not present

## 2014-08-28 DIAGNOSIS — Z09 Encounter for follow-up examination after completed treatment for conditions other than malignant neoplasm: Secondary | ICD-10-CM | POA: Diagnosis not present

## 2014-10-23 DIAGNOSIS — E785 Hyperlipidemia, unspecified: Secondary | ICD-10-CM | POA: Diagnosis not present

## 2014-10-23 DIAGNOSIS — I10 Essential (primary) hypertension: Secondary | ICD-10-CM | POA: Diagnosis not present

## 2014-10-23 DIAGNOSIS — R42 Dizziness and giddiness: Secondary | ICD-10-CM | POA: Diagnosis not present

## 2014-10-23 DIAGNOSIS — Z23 Encounter for immunization: Secondary | ICD-10-CM | POA: Diagnosis not present

## 2014-10-23 DIAGNOSIS — R7309 Other abnormal glucose: Secondary | ICD-10-CM | POA: Diagnosis not present

## 2014-11-07 DIAGNOSIS — I10 Essential (primary) hypertension: Secondary | ICD-10-CM | POA: Diagnosis not present

## 2015-01-01 DIAGNOSIS — R7309 Other abnormal glucose: Secondary | ICD-10-CM | POA: Diagnosis not present

## 2015-01-01 DIAGNOSIS — E785 Hyperlipidemia, unspecified: Secondary | ICD-10-CM | POA: Diagnosis not present

## 2015-01-01 DIAGNOSIS — I10 Essential (primary) hypertension: Secondary | ICD-10-CM | POA: Diagnosis not present

## 2015-01-22 DIAGNOSIS — I10 Essential (primary) hypertension: Secondary | ICD-10-CM | POA: Diagnosis not present

## 2015-02-13 DIAGNOSIS — C61 Malignant neoplasm of prostate: Secondary | ICD-10-CM | POA: Diagnosis not present

## 2015-02-19 DIAGNOSIS — R972 Elevated prostate specific antigen [PSA]: Secondary | ICD-10-CM | POA: Diagnosis not present

## 2015-02-19 DIAGNOSIS — N138 Other obstructive and reflux uropathy: Secondary | ICD-10-CM | POA: Diagnosis not present

## 2015-02-19 DIAGNOSIS — R3915 Urgency of urination: Secondary | ICD-10-CM | POA: Diagnosis not present

## 2015-02-19 DIAGNOSIS — N401 Enlarged prostate with lower urinary tract symptoms: Secondary | ICD-10-CM | POA: Diagnosis not present

## 2015-02-19 DIAGNOSIS — Z0000 Encounter for general adult medical examination without abnormal findings: Secondary | ICD-10-CM | POA: Diagnosis not present

## 2015-02-19 DIAGNOSIS — C61 Malignant neoplasm of prostate: Secondary | ICD-10-CM | POA: Diagnosis not present

## 2015-03-13 DIAGNOSIS — J209 Acute bronchitis, unspecified: Secondary | ICD-10-CM | POA: Diagnosis not present

## 2015-03-19 DIAGNOSIS — J9801 Acute bronchospasm: Secondary | ICD-10-CM | POA: Diagnosis not present

## 2015-03-19 DIAGNOSIS — J209 Acute bronchitis, unspecified: Secondary | ICD-10-CM | POA: Diagnosis not present

## 2015-04-16 DIAGNOSIS — Z0000 Encounter for general adult medical examination without abnormal findings: Secondary | ICD-10-CM | POA: Diagnosis not present

## 2015-04-16 DIAGNOSIS — C61 Malignant neoplasm of prostate: Secondary | ICD-10-CM | POA: Diagnosis not present

## 2015-04-16 DIAGNOSIS — I10 Essential (primary) hypertension: Secondary | ICD-10-CM | POA: Diagnosis not present

## 2015-04-16 DIAGNOSIS — E785 Hyperlipidemia, unspecified: Secondary | ICD-10-CM | POA: Diagnosis not present

## 2015-04-23 DIAGNOSIS — G20 Parkinson's disease: Secondary | ICD-10-CM | POA: Diagnosis not present

## 2015-04-23 DIAGNOSIS — R030 Elevated blood-pressure reading, without diagnosis of hypertension: Secondary | ICD-10-CM | POA: Diagnosis not present

## 2015-04-23 DIAGNOSIS — R0989 Other specified symptoms and signs involving the circulatory and respiratory systems: Secondary | ICD-10-CM | POA: Diagnosis not present

## 2015-04-23 DIAGNOSIS — G250 Essential tremor: Secondary | ICD-10-CM | POA: Diagnosis not present

## 2015-04-27 ENCOUNTER — Other Ambulatory Visit: Payer: Self-pay | Admitting: Neurology

## 2015-04-27 DIAGNOSIS — R0989 Other specified symptoms and signs involving the circulatory and respiratory systems: Secondary | ICD-10-CM

## 2015-05-21 ENCOUNTER — Ambulatory Visit
Admission: RE | Admit: 2015-05-21 | Discharge: 2015-05-21 | Disposition: A | Discharge status: Home / Self Care | Payer: Medicare Other | Source: Ambulatory Visit | Attending: Neurology | Admitting: Neurology

## 2015-05-21 DIAGNOSIS — R0989 Other specified symptoms and signs involving the circulatory and respiratory systems: Secondary | ICD-10-CM

## 2015-05-21 DIAGNOSIS — I6523 Occlusion and stenosis of bilateral carotid arteries: Secondary | ICD-10-CM | POA: Diagnosis not present

## 2015-05-25 DIAGNOSIS — H35342 Macular cyst, hole, or pseudohole, left eye: Secondary | ICD-10-CM | POA: Diagnosis not present

## 2015-05-29 ENCOUNTER — Encounter: Payer: Self-pay | Admitting: Neurology

## 2015-05-29 ENCOUNTER — Ambulatory Visit (INDEPENDENT_AMBULATORY_CARE_PROVIDER_SITE_OTHER): Payer: Medicare Other | Admitting: Neurology

## 2015-05-29 ENCOUNTER — Telehealth: Payer: Self-pay | Admitting: Neurology

## 2015-05-29 ENCOUNTER — Other Ambulatory Visit: Payer: Medicare Other

## 2015-05-29 ENCOUNTER — Ambulatory Visit: Payer: Self-pay

## 2015-05-29 VITALS — BP 148/72 | HR 68 | Ht 68.0 in | Wt 208.0 lb

## 2015-05-29 DIAGNOSIS — R251 Tremor, unspecified: Secondary | ICD-10-CM | POA: Diagnosis not present

## 2015-05-29 DIAGNOSIS — G609 Hereditary and idiopathic neuropathy, unspecified: Secondary | ICD-10-CM

## 2015-05-29 LAB — COMPREHENSIVE METABOLIC PANEL
ALK PHOS: 61 U/L (ref 40–115)
ALT: 36 U/L (ref 9–46)
AST: 28 U/L (ref 10–35)
Albumin: 4.1 g/dL (ref 3.6–5.1)
BUN: 24 mg/dL (ref 7–25)
CHLORIDE: 103 mmol/L (ref 98–110)
CO2: 27 mmol/L (ref 20–31)
CREATININE: 0.98 mg/dL (ref 0.70–1.18)
Calcium: 9 mg/dL (ref 8.6–10.3)
GLUCOSE: 95 mg/dL (ref 65–99)
Potassium: 3.9 mmol/L (ref 3.5–5.3)
SODIUM: 140 mmol/L (ref 135–146)
Total Bilirubin: 0.8 mg/dL (ref 0.2–1.2)
Total Protein: 6.4 g/dL (ref 6.1–8.1)

## 2015-05-29 LAB — VITAMIN B12: VITAMIN B 12: 555 pg/mL (ref 200–1100)

## 2015-05-29 LAB — FOLATE: FOLATE: 19.8 ng/mL (ref >5.4)

## 2015-05-29 LAB — TSH: TSH: 2.37 m[IU]/L (ref 0.40–4.50)

## 2015-05-29 NOTE — Patient Instructions (Signed)
1. Your provider has requested that you have labwork completed today. Please go to [facility] ([id number]) on the second floor of this building before leaving the office today. You do not need to check in. If you are not called within 15 minutes please check with the front desk.  2. We are referring you to [facility] for a DaT scan. They will contact you directly to set up a time for this testing. If you do not hear from them they can be contacted at [phone].

## 2015-05-29 NOTE — Progress Notes (Signed)
Note sent to Dr Catalina Gravel and Dr Orland Mustard.
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
## 2015-05-29 NOTE — Telephone Encounter (Signed)
Letter and office note faxed to [facility] per patient's request at [phone] with confirmation received.

## 2015-05-30 LAB — RPR

## 2015-06-02 LAB — PROTEIN ELECTROPHORESIS, SERUM
ALPHA-1-GLOBULIN: 0.3 g/dL (ref 0.2–0.3)
Albumin ELP: 3.9 g/dL (ref 3.8–4.8)
Alpha-2-Globulin: 0.7 g/dL (ref 0.5–0.9)
BETA 2: 0.4 g/dL (ref 0.2–0.5)
Beta Globulin: 0.4 g/dL (ref 0.4–0.6)
Gamma Globulin: 0.7 g/dL — ABNORMAL LOW (ref 0.8–1.7)
Total Protein, Serum Electrophoresis: 6.4 g/dL (ref 6.1–8.1)

## 2015-06-02 LAB — PROTEIN ELECTROPHORESIS,RANDOM URN
ALPHA-1-GLOBULIN, U: 33.9 %
Albumin: 11.9 %
Alpha-2-Globulin, U: 21.2 %
Beta Globulin, U: 10.4 %
Creatinine, Urine: 188 mg/dL (ref 20–370)
Gamma Globulin, U: 22.6 %
Protein Creatinine Ratio: 32 mg/g creat (ref 22–128)
TOTAL PROTEIN, URINE: 6 mg/dL (ref 5–25)

## 2015-06-02 LAB — IMMUNOFIXATION ELECTROPHORESIS
IGG (IMMUNOGLOBIN G), SERUM: 800 mg/dL (ref 650–1600)
IGM, SERUM: 70 mg/dL (ref 41–251)
IgA: 254 mg/dL (ref 68–379)

## 2015-06-03 LAB — IMMUNOFIXATION INTE

## 2015-06-04 ENCOUNTER — Telehealth: Payer: Self-pay | Admitting: Neurology

## 2015-06-04 DIAGNOSIS — C61 Malignant neoplasm of prostate: Secondary | ICD-10-CM | POA: Diagnosis not present

## 2015-06-04 NOTE — Telephone Encounter (Signed)
-----   Message from [place], DO sent at 06/04/2015  7:53 AM EDT ----- Let pt know that labs looked ok

## 2015-06-04 NOTE — Telephone Encounter (Signed)
Patient made aware.

## 2015-06-11 DIAGNOSIS — Z0000 Encounter for general adult medical examination without abnormal findings: Secondary | ICD-10-CM | POA: Diagnosis not present

## 2015-06-11 DIAGNOSIS — R972 Elevated prostate specific antigen [PSA]: Secondary | ICD-10-CM | POA: Diagnosis not present

## 2015-06-11 DIAGNOSIS — R3915 Urgency of urination: Secondary | ICD-10-CM | POA: Diagnosis not present

## 2015-06-11 DIAGNOSIS — C61 Malignant neoplasm of prostate: Secondary | ICD-10-CM | POA: Diagnosis not present

## 2015-06-11 DIAGNOSIS — N401 Enlarged prostate with lower urinary tract symptoms: Secondary | ICD-10-CM | POA: Diagnosis not present

## 2015-06-22 DIAGNOSIS — R9431 Abnormal electrocardiogram [ECG] [EKG]: Secondary | ICD-10-CM | POA: Diagnosis not present

## 2015-06-22 DIAGNOSIS — Z87898 Personal history of other specified conditions: Secondary | ICD-10-CM | POA: Diagnosis not present

## 2015-06-25 ENCOUNTER — Telehealth: Payer: Self-pay | Admitting: Internal Medicine

## 2015-06-25 NOTE — Telephone Encounter (Signed)
Received records from [place] for appointment on 07/20/15 with Dr Debara Pickett.  Records given to [facility] (medical records) for Dr Lysbeth Penner schedule on 07/20/15. lp

## 2015-07-03 ENCOUNTER — Encounter: Payer: Self-pay | Admitting: Neurology

## 2015-07-06 ENCOUNTER — Telehealth: Payer: Self-pay | Admitting: Cardiology

## 2015-07-06 NOTE — Telephone Encounter (Signed)
Received records from [place] for appointment with Dr Stanford Breed on 07/30/15. Records given to [facility] (medical records) for Dr Jacalyn Lefevre schedule on 07/30/15.

## 2015-07-09 DIAGNOSIS — R251 Tremor, unspecified: Secondary | ICD-10-CM | POA: Diagnosis not present

## 2015-07-10 ENCOUNTER — Telehealth: Payer: Self-pay | Admitting: Neurology

## 2015-07-10 NOTE — Telephone Encounter (Signed)
Patient made aware of results.  

## 2015-07-10 NOTE — Telephone Encounter (Signed)
You can let pt know that DaT scan normal and we will discuss more next visit in July

## 2015-07-10 NOTE — Telephone Encounter (Signed)
Travis Morris 03/18/1940. He was returning your call. His number is [phone]. Thank you

## 2015-07-10 NOTE — Telephone Encounter (Signed)
Left message with wife for patient to call back.

## 2015-07-16 ENCOUNTER — Encounter: Payer: Self-pay | Admitting: *Deleted

## 2015-07-16 DIAGNOSIS — R251 Tremor, unspecified: Secondary | ICD-10-CM | POA: Insufficient documentation

## 2015-07-16 DIAGNOSIS — E785 Hyperlipidemia, unspecified: Secondary | ICD-10-CM | POA: Insufficient documentation

## 2015-07-16 DIAGNOSIS — R7309 Other abnormal glucose: Secondary | ICD-10-CM | POA: Insufficient documentation

## 2015-07-16 DIAGNOSIS — I10 Essential (primary) hypertension: Secondary | ICD-10-CM | POA: Insufficient documentation

## 2015-07-16 DIAGNOSIS — K219 Gastro-esophageal reflux disease without esophagitis: Secondary | ICD-10-CM | POA: Insufficient documentation

## 2015-07-16 DIAGNOSIS — C61 Malignant neoplasm of prostate: Secondary | ICD-10-CM | POA: Insufficient documentation

## 2015-07-16 DIAGNOSIS — N529 Male erectile dysfunction, unspecified: Secondary | ICD-10-CM | POA: Insufficient documentation

## 2015-07-20 ENCOUNTER — Ambulatory Visit: Payer: Medicare Other | Admitting: Internal Medicine

## 2015-07-28 NOTE — Progress Notes (Signed)
Cardiology Office Note    Date:  07/30/2015   ID:  Travis Morris, DOB [birth year], MRN [id number]  PCP:  London Pepper, MD  Cardiologist:  Kirk Ruths, MD     History of Present Illness:  Travis Morris is a 75 y.o. male for evaluation of abnormal ECG. Echocardiogram August 2016 showed normal LV systolic function, moderate left ventricular hypertrophy, mild aortic insufficiency and trace mitral regurgitation. Carotid Dopplers May 2017 showed minor carotid atherosclerosis with less than 50% bilaterally. Patient recently seen for [facility] physical and noted to have an abnormal electrocardiogram and cardiology asked to evaluate. Patient has dyspnea with extreme activities but not routine activities. No orthopnea, PND, pedal edema, exertional chest pain or syncope. Occasional brief pain in his chest that resolves with coughing.    Past Medical History  Diagnosis Date  . BPH with urinary obstruction   . Prostate cancer ([place])   . Organic impotence   . History of balanitis   . GERD (gastroesophageal reflux disease)     ESOPHAGEAL REFLUX HX  . Hypercholesteremia   . Hypertension   . History of phimosis   . Hx of biopsy     PENIS CUTANEOUS HX  . Macular hole     AIR FLUID EXCHANGE   . History of repair of rotator cuff     Past Surgical History  Procedure Laterality Date  . Appendectomy    . Uvuloplasty      LASER ASSISTED  . Rotator cuff repair Right   . Pars plana vitrectomy w/ repair of macular hole      x3    Current Medications: Outpatient Prescriptions Prior to Visit  Medication Sig Dispense Refill  . aspirin 81 MG tablet Take 81 mg by mouth daily.      Marland Kitchen atorvastatin (LIPITOR) 20 MG tablet Take 20 mg by mouth daily.    . hydrochlorothiazide (HYDRODIURIL) 25 MG tablet Take 25 mg by mouth daily.    Marland Kitchen losartan (COZAAR) 25 MG tablet Take 25 mg by mouth daily.    . metoprolol succinate (TOPROL-XL) 25 MG 24 hr tablet Take 25 mg by mouth daily.    . Multiple Vitamin  (MULTIVITAMINS PO) Take 1 tablet by mouth daily. CENTRUM SILVER     . omeprazole (PRILOSEC) 20 MG capsule Take 20 mg by mouth daily.      Marland Kitchen oxybutynin (DITROPAN-XL) 5 MG 24 hr tablet Take 5 mg by mouth at bedtime.    . vitamin E 400 UNIT capsule Take 400 Units by mouth daily.       No facility-administered medications prior to visit.     Allergies:   Review of patient's allergies indicates no known allergies.   Social History   Social History  . Marital Status: Married    Spouse Name: N/A  . Number of Children: 2  . Years of Education: N/A   Occupational History  .      accountant, semi-retired   Social History Main Topics  . Smoking status: Never Smoker   . Smokeless tobacco: None  . Alcohol Use: 0.0 oz/week    0 Standard drinks or equivalent per week     Comment: 2 oz gin weekly  . Drug Use: No  . Sexual Activity: Not Asked   Other Topics Concern  . None   Social History Narrative     Family History:  The patient's family history includes Colon cancer in his brother; Dementia in his father; Healthy in his daughter and  son; Heart attack in his brother; Liver cancer in his brother.   ROS:   Please see the history of present illness.    No weight loss, productive cough, hemoptysis, dysphagia, odynophagia, melena, hematochezia, dysuria, hematuria, rash, seizure activity, orthopnea, PND, pedal edema, claudication. All remaining systems negative.   PHYSICAL EXAM:   VS:  BP 156/74 mmHg  Pulse 67  Ht 5\' 8"  (1.727 m)  Wt 212 lb (96.163 kg)  BMI 32.24 kg/m2   GEN: Well nourished, well developed, in no acute distress HEENT: normal Neck: no JVD, carotid bruits, or masses, Bilateral bruits noted. Cardiac: RRR; no rubs, or gallops,no edema; 1/6 systolic ejection murmur Respiratory:  clear to auscultation bilaterally, normal work of breathing GI: soft, nontender, nondistended, + BS, Positive bruit. MS: no deformity or atrophy Skin: warm and dry, no rash Neuro:  Alert and  Oriented x 3, Strength and sensation are intact,  Resting tremor noted. Psych: euthymic mood, full affect  Wt Readings from Last 3 Encounters:  07/30/15 212 lb (96.163 kg)  05/29/15 208 lb (94.348 kg)  08/24/14 208 lb 8.9 oz (94.6 kg)      Studies/Labs Reviewed:   EKG:  EKG - Sinus rhythm at a rate of 67.Nonspecific ST-T changes.  Recent Labs: 08/24/2014: Hemoglobin 12.7*; Platelets 156 08/25/2014: Magnesium 1.9 05/29/2015: ALT 36; BUN 24; Creat 0.98; Potassium 3.9; Sodium 140; TSH 2.37   Lipid Panel    Component Value Date/Time   CHOL 99 08/24/2014 0541   TRIG 54 08/24/2014 0541   HDL 24* 08/24/2014 0541   CHOLHDL 4.1 08/24/2014 0541   VLDL 11 08/24/2014 0541   LDLCALC 64 08/24/2014 0541    Additional studies/ records that were reviewed today include:     A/P  1 Abnormal electrocardiogram-I have reviewed his electrocardiograms and there is sinus rhythm with nonspecific ST changes. He has some dyspnea with more extreme activities. Patient has an occasional brief pain in his chest that resolves with coughing. No exertional chest pain. 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  2 abdominal bruit-schedule ultrasound to exclude aneurysm.  3 carotid bruit-schedule carotid Dopplers to exclude significant disease.  4 hypertension-blood pressure elevated. Increase Cozaar to 50 mg daily and adjust as needed. Check potassium and renal function in 1 week.  5 hyperlipidemia-continue statin.    Medication Adjustments/Labs and Tests Ordered: Current medicines are reviewed at length with the patient today.  Concerns regarding medicines are outlined above.  Medication changes, Labs and Tests ordered today are listed in the Patient Instructions below. There are no Patient Instructions on file for this visit.   Signed, Kirk Ruths, MD  07/30/2015 11:11 AM    [place]

## 2015-07-30 ENCOUNTER — Encounter: Payer: Self-pay | Admitting: Cardiology

## 2015-07-30 ENCOUNTER — Ambulatory Visit (INDEPENDENT_AMBULATORY_CARE_PROVIDER_SITE_OTHER): Payer: Medicare Other | Admitting: Cardiology

## 2015-07-30 VITALS — BP 156/74 | HR 67 | Ht 68.0 in | Wt 212.0 lb

## 2015-07-30 DIAGNOSIS — I10 Essential (primary) hypertension: Secondary | ICD-10-CM

## 2015-07-30 DIAGNOSIS — R0600 Dyspnea, unspecified: Secondary | ICD-10-CM | POA: Diagnosis not present

## 2015-07-30 DIAGNOSIS — R9431 Abnormal electrocardiogram [ECG] [EKG]: Secondary | ICD-10-CM

## 2015-07-30 DIAGNOSIS — R0989 Other specified symptoms and signs involving the circulatory and respiratory systems: Secondary | ICD-10-CM

## 2015-07-30 DIAGNOSIS — R072 Precordial pain: Secondary | ICD-10-CM

## 2015-07-30 MED ORDER — LOSARTAN POTASSIUM 50 MG PO TABS: 50.0000 mg | ORAL_TABLET | Freq: Every day | ORAL | Status: DC

## 2015-07-30 NOTE — Patient Instructions (Signed)
Medication Instructions:   INCREASE LOSARTAN TO 50 MG ONCE DAILY= 2 OF THE 25 MG TABLETS ONCE DAILY  Labwork:  Your physician recommends that you return for lab work in: [place]  Testing/Procedures:  Your physician has requested that you have en exercise stress myoview. For further information please visit [email or web address]. Please follow instruction sheet, as given.   Your physician has requested that you have a carotid duplex. This test is an ultrasound of the carotid arteries in your neck. It looks at blood flow through these arteries that supply the brain with blood. Allow one hour for this exam. There are no restrictions or special instructions.   Your physician has requested that you have an abdominal aorta duplex. During this test, an ultrasound is used to evaluate the aorta. Allow 30 minutes for this exam. Do not eat after midnight the day before and avoid carbonated beverages   Follow-Up:  Your physician recommends that you schedule a follow-up appointment in: AS NEEDED PENDING TEST RESULTS

## 2015-07-31 ENCOUNTER — Telehealth (HOSPITAL_COMMUNITY): Payer: Self-pay

## 2015-07-31 NOTE — Telephone Encounter (Signed)
Encounter complete. 

## 2015-08-04 ENCOUNTER — Ambulatory Visit (HOSPITAL_COMMUNITY)
Admission: RE | Admit: 2015-08-04 | Discharge: 2015-08-04 | Disposition: A | Discharge status: Home / Self Care | Payer: Medicare Other | Source: Ambulatory Visit | Attending: Cardiology | Admitting: Cardiology

## 2015-08-04 DIAGNOSIS — R0600 Dyspnea, unspecified: Secondary | ICD-10-CM

## 2015-08-04 DIAGNOSIS — E663 Overweight: Secondary | ICD-10-CM | POA: Diagnosis not present

## 2015-08-04 DIAGNOSIS — R9439 Abnormal result of other cardiovascular function study: Secondary | ICD-10-CM | POA: Insufficient documentation

## 2015-08-04 DIAGNOSIS — Z6832 Body mass index (BMI) 32.0-32.9, adult: Secondary | ICD-10-CM | POA: Insufficient documentation

## 2015-08-04 DIAGNOSIS — Z8249 Family history of ischemic heart disease and other diseases of the circulatory system: Secondary | ICD-10-CM | POA: Insufficient documentation

## 2015-08-04 DIAGNOSIS — R0609 Other forms of dyspnea: Secondary | ICD-10-CM | POA: Insufficient documentation

## 2015-08-04 DIAGNOSIS — R0602 Shortness of breath: Secondary | ICD-10-CM | POA: Diagnosis not present

## 2015-08-04 DIAGNOSIS — R072 Precordial pain: Secondary | ICD-10-CM

## 2015-08-04 DIAGNOSIS — R9431 Abnormal electrocardiogram [ECG] [EKG]: Secondary | ICD-10-CM | POA: Diagnosis not present

## 2015-08-04 DIAGNOSIS — I10 Essential (primary) hypertension: Secondary | ICD-10-CM

## 2015-08-04 LAB — MYOCARDIAL PERFUSION IMAGING
CHL RATE OF PERCEIVED EXERTION: 17
CSEPEDS: 47 s
CSEPEW: 7 METS
Exercise duration (min): 5 min
LVDIAVOL: 104 mL (ref 62–150)
LVSYSVOL: 42 mL
MPHR: 142 {beats}/min
Peak HR: 133 {beats}/min
Percent HR: 93 %
Rest HR: 62 {beats}/min
SDS: 0
SRS: 2
SSS: 2
TID: 1.05

## 2015-08-04 MED ORDER — TECHNETIUM TC 99M TETROFOSMIN IV KIT
10.9000 | PACK | Freq: Once | INTRAVENOUS | Status: AC | PRN
  Administered 2015-08-04: 10.9 via INTRAVENOUS
  Filled 2015-08-04: qty 11

## 2015-08-04 MED ORDER — TECHNETIUM TC 99M TETROFOSMIN IV KIT
30.9000 | PACK | Freq: Once | INTRAVENOUS | Status: AC | PRN
  Administered 2015-08-04: 30.9 via INTRAVENOUS
  Filled 2015-08-04: qty 31

## 2015-08-05 ENCOUNTER — Encounter: Payer: Self-pay | Admitting: Cardiology
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
## 2015-08-11 ENCOUNTER — Encounter: Payer: Self-pay | Admitting: Neurology

## 2015-08-11 ENCOUNTER — Ambulatory Visit (INDEPENDENT_AMBULATORY_CARE_PROVIDER_SITE_OTHER): Payer: Medicare Other | Admitting: Neurology

## 2015-08-11 VITALS — BP 148/74 | HR 65 | Ht 68.0 in | Wt 216.0 lb

## 2015-08-11 DIAGNOSIS — G250 Essential tremor: Secondary | ICD-10-CM | POA: Insufficient documentation

## 2015-08-12 ENCOUNTER — Telehealth: Payer: Self-pay | Admitting: *Deleted

## 2015-08-12 ENCOUNTER — Encounter (HOSPITAL_COMMUNITY): Payer: Self-pay

## 2015-08-12 ENCOUNTER — Ambulatory Visit (HOSPITAL_COMMUNITY)
Admission: RE | Admit: 2015-08-12 | Discharge: 2015-08-12 | Disposition: A | Discharge status: Home / Self Care | Payer: Medicare Other | Source: Ambulatory Visit | Attending: Urology | Admitting: Urology

## 2015-08-12 ENCOUNTER — Ambulatory Visit (HOSPITAL_COMMUNITY): Admission: RE | Admit: 2015-08-12 | Payer: Medicare Other | Source: Ambulatory Visit

## 2015-08-12 ENCOUNTER — Other Ambulatory Visit: Payer: Self-pay | Admitting: Cardiology

## 2015-08-12 DIAGNOSIS — E7800 Pure hypercholesterolemia, unspecified: Secondary | ICD-10-CM | POA: Diagnosis not present

## 2015-08-12 DIAGNOSIS — I700 Atherosclerosis of aorta: Secondary | ICD-10-CM | POA: Diagnosis not present

## 2015-08-12 DIAGNOSIS — I10 Essential (primary) hypertension: Secondary | ICD-10-CM | POA: Insufficient documentation

## 2015-08-12 DIAGNOSIS — K219 Gastro-esophageal reflux disease without esophagitis: Secondary | ICD-10-CM | POA: Diagnosis not present

## 2015-08-12 DIAGNOSIS — R0989 Other specified symptoms and signs involving the circulatory and respiratory systems: Secondary | ICD-10-CM | POA: Insufficient documentation

## 2015-08-12 DIAGNOSIS — I708 Atherosclerosis of other arteries: Secondary | ICD-10-CM | POA: Insufficient documentation

## 2015-08-12 DIAGNOSIS — I6523 Occlusion and stenosis of bilateral carotid arteries: Secondary | ICD-10-CM

## 2015-08-12 NOTE — Telephone Encounter (Signed)
Patient walk-in w/ med related question. He saw Dr. Stanford Breed on 7/13 and had a medication adjustment based on his reported meds he was taking -- Losartan. He was instructed to change from Losartan 25mg  to Losartan 50mg . Came in today because he noted on bottle of previous med, it was labeled as Losartan-HCTZ 100-25. He had, prior to med change, been taking one of these a day.  He has already taken the losartan 50mg  today, so I advised no med changes at this time. Informed him likely we would need to put him back on the original dose w HCTZ & that I would get recommendation from pharmD on changes, as Dr. Stanford Breed out of office this week. Pt aware I will call him at home w/ recommendations.  He was also instructed to get a BMET 1 week after med changes but has not gotten this drawn yet - since we will enact further med changes, I told him to postpone this until further instruction.

## 2015-08-12 NOTE — Telephone Encounter (Signed)
Patient informs me he does not take the separate HCTZ pill. Med list updated. Advice to resume original Los-HCTZ dose and stop losartan 50mg  communicated, patient voiced understanding. He is scheduled for a BP visit in 2 weeks and has been given appt information.  Pt aware to call if new concerns.

## 2015-08-12 NOTE — Telephone Encounter (Signed)
Can we assume his BP is still elevated > 150/90?  Chart also notes he takes HCTZ 25mg  qd.  If BP is still elevated to > 150/90 have him bump back to the losartan hctz 100/25 and make sure he stops the separate hctz tablet.  Have him draw labs after 1 week and come for BP eval after 2 weeks.

## 2015-08-20 DIAGNOSIS — R0600 Dyspnea, unspecified: Secondary | ICD-10-CM | POA: Diagnosis not present

## 2015-08-21 ENCOUNTER — Telehealth: Payer: Self-pay | Admitting: Neurology

## 2015-08-21 LAB — BASIC METABOLIC PANEL
BUN: 20 mg/dL (ref 7–25)
CO2: 25 mmol/L (ref 20–31)
CREATININE: 1.06 mg/dL (ref 0.70–1.18)
Calcium: 9.3 mg/dL (ref 8.6–10.3)
Chloride: 104 mmol/L (ref 98–110)
GLUCOSE: 123 mg/dL — AB (ref 65–99)
Potassium: 4.3 mmol/L (ref 3.5–5.3)
Sodium: 140 mmol/L (ref 135–146)

## 2015-08-21 NOTE — Telephone Encounter (Signed)
Patient's wife made aware copy of DAT scan printed and at front desk for pick up.

## 2015-08-21 NOTE — Telephone Encounter (Signed)
Travis Morris 07/19/1940. He was calling in needing a copy of his DAT Scan. He called medical records but is unable to get it. He would like to get a copy of it to send to [facility]. His # is [phone]. Thank you

## 2015-08-26 ENCOUNTER — Ambulatory Visit (INDEPENDENT_AMBULATORY_CARE_PROVIDER_SITE_OTHER): Payer: Medicare Other | Admitting: Pharmacist

## 2015-08-26 VITALS — BP 156/80 | HR 64 | Ht 68.0 in | Wt 215.4 lb

## 2015-08-26 DIAGNOSIS — I6523 Occlusion and stenosis of bilateral carotid arteries: Secondary | ICD-10-CM

## 2015-08-26 DIAGNOSIS — I10 Essential (primary) hypertension: Secondary | ICD-10-CM

## 2015-08-26 MED ORDER — METOPROLOL SUCCINATE ER 50 MG PO TB24: 50.0000 mg | ORAL_TABLET | Freq: Every day | ORAL | 1 refills | Status: DC

## 2015-08-26 MED ORDER — LOSARTAN POTASSIUM-HCTZ 100-25 MG PO TABS: 1.0000 | ORAL_TABLET | Freq: Every day | ORAL | 1 refills | Status: AC

## 2015-08-26 NOTE — Progress Notes (Signed)
Patient ID: Travis Morris                 DOB: 1940/08/21                      MRN: [id number]     HPI: Travis Morris is a 75 y.o. male patient of Dr. Stanford Breed with PMH below who presents today for hypertension evaluation. He recently called in about his losartan dose since it was discovered that he was actually taking losartan-HCT 100-25mg . He restarted this combination about 2 weeks ago.   He reports that he previously had one episode of low heart rate which he attributes to dehydration, but otherwise he recalls his HR remaining in the upper 60s.    Cardiac Hx: bradycardia, TIA, HTN, HLD  Current HTN meds:  Losartan/HCT 100/25mg  daily Metoprolol XL 25mg  daily  Previously tried: bystolic - stopped for unknown reason Lisinopril - stopped for unknown reason  BP goal: <150/90   Family History: Mother and father very healthy and passed away at >62 yo. Has 2 brothers one of whom passed from kidney disease and the other has had a heart attack.   Social History: He denies tobacco use and states he drinks 2-3 drinks per week.   Diet: He does not add salt to his foods and eats most of his meals from home. He drinks 1 cup of coffee per day and rarely drinks tea or soda.   Home BP readings: He has not recently been monitoring his BP though he does have a cuff at home.   Wt Readings from Last 3 Encounters:  08/26/15 215 lb 6.4 oz (97.7 kg)  08/11/15 216 lb (98 kg)  08/04/15 212 lb (96.2 kg)   BP Readings from Last 3 Encounters:  08/26/15 (!) 156/80  08/11/15 (!) 148/74  07/30/15 (!) 156/74   Pulse Readings from Last 3 Encounters:  08/26/15 64  08/11/15 65  07/30/15 67    Renal function: Estimated Creatinine Clearance: 66.1 mL/min (by C-G formula based on SCr of 1.06 mg/dL).  Past Medical History:  Diagnosis Date  . BPH with urinary obstruction   . GERD (gastroesophageal reflux disease)    ESOPHAGEAL REFLUX HX  . History of balanitis   . History of phimosis   .  History of repair of rotator cuff   . Hx of biopsy    PENIS CUTANEOUS HX  . Hypercholesteremia   . Hypertension   . Macular hole    AIR FLUID EXCHANGE   . Organic impotence   . Prostate cancer [facility])     Current Outpatient Prescriptions on File Prior to Visit  Medication Sig Dispense Refill  . aspirin 81 MG tablet Take 81 mg by mouth daily.      Marland Kitchen atorvastatin (LIPITOR) 20 MG tablet Take 20 mg by mouth daily.    . Multiple Vitamin (MULTIVITAMINS PO) Take 1 tablet by mouth daily. CENTRUM SILVER     . omeprazole (PRILOSEC) 20 MG capsule Take 20 mg by mouth daily.      Marland Kitchen oxybutynin (DITROPAN-XL) 5 MG 24 hr tablet Take 5 mg by mouth 3 (three) times daily.     . vitamin E 400 UNIT capsule Take 400 Units by mouth daily.       No current facility-administered medications on file prior to visit.     No Known Allergies  Blood pressure (!) 156/80, pulse 64, height 5\' 8"  (1.727 m), weight 215 lb 6.4 oz (97.7  kg).   Assessment/Plan: Hypertension: BP is borderline at goal today. Will titrate his metoprolol to 50mg  once daily rather than adding additional agent since he is borderline. Have asked that he check his pressure and HR daily for the next several weeks and call if his HR drops. Have also asked that he bring the logs to his follow up appt with Dr. Stanford Breed.   Elevated glucose on recent labs: Patient very concerned about glucose being elevated. We discussed proper diet and exercise should help. He will work on this before visit with Dr. Stanford Breed.    Thank you, Travis Morris. Travis Morris, [facility]  08/26/2015 9:13 AM

## 2015-08-26 NOTE — Patient Instructions (Addendum)
Increase metoprolol dose to 50mg  daily (you may take 2 of your current supply then take 1 of the higher strength when you pick up from the pharmacy)  Please call [phone] if your HR trends less than 50.   Check your blood pressure at home daily (if able) and keep record of the readings.  Bring all of your meds, your BP cuff and your record of home blood pressures to your next appointment.  Exercise as you're able, try to walk approximately 30 minutes per day.  Keep salt intake to a minimum, especially watch canned and prepared boxed foods.  Eat more fresh fruits and vegetables and fewer canned items.  Avoid eating in fast food restaurants.    HOW TO TAKE YOUR BLOOD PRESSURE: . Rest 5 minutes before taking your blood pressure. .  Don't smoke or drink caffeinated beverages for at least 30 minutes before. . Take your blood pressure before (not after) you eat. . Sit comfortably with your back supported and both feet on the floor (don't cross your legs). . Elevate your arm to heart level on a table or a desk. . Use the proper sized cuff. It should fit smoothly and snugly around your bare upper arm. There should be enough room to slip a fingertip under the cuff. The bottom edge of the cuff should be 1 inch above the crease of the elbow. . Ideally, take 3 measurements at one sitting and record the average.

## 2015-09-08 ENCOUNTER — Telehealth: Payer: Self-pay | Admitting: Cardiology

## 2015-09-08 NOTE — Telephone Encounter (Signed)
Returned call to patient-pt reports he received letter from [facility] stating that his stress test "is below our standards, there was evidence of reversible myocardial ischemia" therefore making patient "unfit for aviation regulations".  Pt would like to make appt with MD Crenshaw to discuss or receive recommendations regarding this information.    Will route to MD and primary.

## 2015-09-08 NOTE — Telephone Encounter (Signed)
Low risk study; schedule ov Kirk Ruths

## 2015-09-08 NOTE — Telephone Encounter (Signed)
Study is low risk; can arrange ov Travis Morris

## 2015-09-08 NOTE — Telephone Encounter (Signed)
Spoke with pt, Follow up scheduled  

## 2015-09-08 NOTE — Telephone Encounter (Signed)
NEW MESSAGE    Pt verbalized that he is calling for rn about his test results of stress test

## 2015-09-22 NOTE — Progress Notes (Signed)
HPI: FU hypertension. Echocardiogram August 2016 showed normal LV systolic function, moderate left ventricular hypertrophy, mild aortic insufficiency and trace mitral regurgitation. Carotid Dopplers May 2017 showed minor carotid atherosclerosis with less than 50% bilaterally. Nuclear study 7/17 showed EF 60; probable diaphragmatic attenuation but mild ischemia could not be excluded; we elected medical therapy. Abd ultrasound 7/17 showed no aneurysm. Since last seen, He denies dyspnea, chest pain, palpitations or syncope.  Current Outpatient Prescriptions  Medication Sig Dispense Refill  . aspirin 81 MG tablet Take 81 mg by mouth daily.      Marland Kitchen atorvastatin (LIPITOR) 20 MG tablet Take 20 mg by mouth daily.    Marland Kitchen losartan-hydrochlorothiazide (HYZAAR) 100-25 MG tablet Take 1 tablet by mouth daily. 90 tablet 1  . metoprolol succinate (TOPROL-XL) 50 MG 24 hr tablet Take 1 tablet (50 mg total) by mouth daily. 90 tablet 1  . Multiple Vitamin (MULTIVITAMINS PO) Take 1 tablet by mouth daily. CENTRUM SILVER     . omeprazole (PRILOSEC) 20 MG capsule Take 20 mg by mouth daily.      Marland Kitchen oxybutynin (DITROPAN-XL) 5 MG 24 hr tablet Take 5 mg by mouth 3 (three) times daily.     . vitamin E 400 UNIT capsule Take 400 Units by mouth daily.       No current facility-administered medications for this visit.      Past Medical History:  Diagnosis Date  . BPH with urinary obstruction   . GERD (gastroesophageal reflux disease)    ESOPHAGEAL REFLUX HX  . History of balanitis   . History of phimosis   . History of repair of rotator cuff   . Hx of biopsy    PENIS CUTANEOUS HX  . Hypercholesteremia   . Hypertension   . Macular hole    AIR FLUID EXCHANGE   . Organic impotence   . Prostate cancer [facility])     Past Surgical History:  Procedure Laterality Date  . APPENDECTOMY    . PARS PLANA VITRECTOMY W/ REPAIR OF MACULAR HOLE     x3  . ROTATOR CUFF REPAIR Right   . UVULOPLASTY     LASER ASSISTED     Social History   Social History  . Marital status: Married    Spouse name: N/A  . Number of children: 2  . Years of education: N/A   Occupational History  .      accountant, semi-retired   Social History Main Topics  . Smoking status: Never Smoker  . Smokeless tobacco: Never Used  . Alcohol use 0.0 oz/week     Comment: 2 oz gin weekly  . Drug use: No  . Sexual activity: Not on file   Other Topics Concern  . Not on file   Social History Narrative  . No narrative on file    Family History  Problem Relation Age of Onset  . Dementia Father   . Colon cancer Brother     mets to liver   . Liver cancer Brother   . Heart attack Brother     MI at age 29  . Healthy Son   . Healthy Daughter     ROS: no fevers or chills, productive cough, hemoptysis, dysphasia, odynophagia, melena, hematochezia, dysuria, hematuria, rash, seizure activity, orthopnea, PND, pedal edema, claudication. Remaining systems are negative.  Physical Exam: Well-developed well-nourished in no acute distress.  Skin is warm and dry.  HEENT is normal.  Neck is supple.  Chest is clear to  auscultation with normal expansion.  Cardiovascular exam is regular rate and rhythm. 1/6 systolic ejection murmur Abdominal exam nontender or distended. No masses palpated. Extremities show no edema. neuro grossly intact  ECG  A/P  1 Abnormal nuclear study-stress test reveals probable diaphragmatic attenuation and a mild degree of inferior ischemia cannot be excluded. The patient is not having any cardiac symptoms at this point. I consider this a low risk study. He has been told he cannot fly by the [place] because of this abnormality. I do not think there should be any restrictions to flying. I would be happy to write a letter stating this. If we need to pursue other testing such as cardiac catheterization or cardiac CTA I would be happy to arrange.  2 hypertension-blood pressure controlled. Continue present  medications.  3 hyperlipidemia-continue statin.  Kirk Ruths, MD   1

## 2015-09-24 ENCOUNTER — Encounter: Payer: Self-pay | Admitting: Cardiology

## 2015-09-24 ENCOUNTER — Ambulatory Visit (INDEPENDENT_AMBULATORY_CARE_PROVIDER_SITE_OTHER): Payer: Medicare Other | Admitting: Cardiology

## 2015-09-24 VITALS — BP 142/64 | HR 66 | Ht 68.0 in | Wt 217.0 lb

## 2015-09-24 DIAGNOSIS — I6523 Occlusion and stenosis of bilateral carotid arteries: Secondary | ICD-10-CM | POA: Diagnosis not present

## 2015-09-24 DIAGNOSIS — R9430 Abnormal result of cardiovascular function study, unspecified: Secondary | ICD-10-CM | POA: Diagnosis not present

## 2015-09-24 DIAGNOSIS — E785 Hyperlipidemia, unspecified: Secondary | ICD-10-CM

## 2015-09-24 DIAGNOSIS — I10 Essential (primary) hypertension: Secondary | ICD-10-CM | POA: Diagnosis not present

## 2015-09-24 NOTE — Patient Instructions (Signed)
Medication Instructions:  NO CHANGES.  Follow-Up: Your physician wants you to follow-up in: [place]. You will receive a reminder letter in the mail two months in advance. If you don't receive a letter, please call our office to schedule the follow-up appointment.  If you have any questions, please call [phone], Debra with Dr Jacalyn Lefevre nurse.  If you need a refill on your cardiac medications before your next appointment, please call your pharmacy.

## 2015-10-16 ENCOUNTER — Telehealth: Payer: Self-pay | Admitting: Cardiology

## 2015-10-16 DIAGNOSIS — R9439 Abnormal result of other cardiovascular function study: Secondary | ICD-10-CM

## 2015-10-16 DIAGNOSIS — Z029 Encounter for administrative examinations, unspecified: Secondary | ICD-10-CM

## 2015-10-16 DIAGNOSIS — C61 Malignant neoplasm of prostate: Secondary | ICD-10-CM | POA: Diagnosis not present

## 2015-10-16 DIAGNOSIS — Z0289 Encounter for other administrative examinations: Secondary | ICD-10-CM

## 2015-10-16 DIAGNOSIS — R0600 Dyspnea, unspecified: Secondary | ICD-10-CM

## 2015-10-16 DIAGNOSIS — R7309 Other abnormal glucose: Secondary | ICD-10-CM | POA: Diagnosis not present

## 2015-10-16 DIAGNOSIS — Z6833 Body mass index (BMI) 33.0-33.9, adult: Secondary | ICD-10-CM | POA: Diagnosis not present

## 2015-10-16 DIAGNOSIS — I10 Essential (primary) hypertension: Secondary | ICD-10-CM | POA: Diagnosis not present

## 2015-10-16 DIAGNOSIS — E785 Hyperlipidemia, unspecified: Secondary | ICD-10-CM | POA: Diagnosis not present

## 2015-10-16 DIAGNOSIS — R9431 Abnormal electrocardiogram [ECG] [EKG]: Secondary | ICD-10-CM

## 2015-10-16 DIAGNOSIS — R072 Precordial pain: Secondary | ICD-10-CM

## 2015-10-16 DIAGNOSIS — Z23 Encounter for immunization: Secondary | ICD-10-CM | POA: Diagnosis not present

## 2015-10-16 DIAGNOSIS — R9430 Abnormal result of cardiovascular function study, unspecified: Secondary | ICD-10-CM

## 2015-10-16 NOTE — Telephone Encounter (Signed)
Schedule stress echo Travis Morris

## 2015-10-16 NOTE — Telephone Encounter (Signed)
New Message  Pt voiced wanting to know about rescheduling a stress test, there's no order in the system.  Please f/u with pt

## 2015-10-16 NOTE — Telephone Encounter (Signed)
Spoke to patient. Patient states the information of his recent stress test was submitted to [employer] [place].  Per Patient, the company would like him to resubmit another stress test to prove a more normal test. Patient would like to schedule another stress test. Patient stated that  Dr Stanford Breed did not think it was problem.  Informed patient , will defer to Dr Stanford Breed and contact him back.   Patient states he will be out of town starting Monday 10/19/15 for 2 weeks and would like to do it when he returns.

## 2015-10-16 NOTE — Telephone Encounter (Signed)
SPOKE TO PATIENT . INFORMATION GIVEN. VERBALIZED UNDERSTANDING. ORDER PLACED AND SCHEDULER WILL GIVE PATIENT A CALL

## 2015-10-30 ENCOUNTER — Telehealth (HOSPITAL_COMMUNITY): Payer: Self-pay | Admitting: *Deleted

## 2015-10-30 NOTE — Telephone Encounter (Signed)
Attempted to call patient and give instructions.  No answer.

## 2015-11-05 ENCOUNTER — Ambulatory Visit (HOSPITAL_BASED_OUTPATIENT_CLINIC_OR_DEPARTMENT_OTHER): Payer: Medicare Other

## 2015-11-05 ENCOUNTER — Ambulatory Visit (HOSPITAL_COMMUNITY): Payer: Medicare Other | Attending: Cardiovascular Disease

## 2015-11-05 DIAGNOSIS — R072 Precordial pain: Secondary | ICD-10-CM | POA: Insufficient documentation

## 2015-11-05 DIAGNOSIS — Z0289 Encounter for other administrative examinations: Secondary | ICD-10-CM

## 2015-11-05 DIAGNOSIS — R0600 Dyspnea, unspecified: Secondary | ICD-10-CM | POA: Insufficient documentation

## 2015-11-05 DIAGNOSIS — Z029 Encounter for administrative examinations, unspecified: Secondary | ICD-10-CM | POA: Diagnosis not present

## 2015-11-05 DIAGNOSIS — R9430 Abnormal result of cardiovascular function study, unspecified: Secondary | ICD-10-CM

## 2015-11-05 DIAGNOSIS — R9439 Abnormal result of other cardiovascular function study: Secondary | ICD-10-CM | POA: Insufficient documentation

## 2015-11-05 DIAGNOSIS — R0989 Other specified symptoms and signs involving the circulatory and respiratory systems: Secondary | ICD-10-CM

## 2015-11-05 DIAGNOSIS — I10 Essential (primary) hypertension: Secondary | ICD-10-CM | POA: Insufficient documentation

## 2015-11-05 DIAGNOSIS — R9431 Abnormal electrocardiogram [ECG] [EKG]: Secondary | ICD-10-CM

## 2015-11-10 ENCOUNTER — Telehealth: Payer: Self-pay | Admitting: Cardiology

## 2015-11-10 NOTE — Telephone Encounter (Signed)
Returned call to patient.He stated he needed Stress Echo report he had done last week sent to [employer] [place] attention [employer].Fax # [phone].Advised Dr.Crenshaw's nurse out of office today.I will send this message to her.

## 2015-11-10 NOTE — Telephone Encounter (Signed)
Please call,questions about his stress test he had on last Thursday.

## 2015-11-11 NOTE — Telephone Encounter (Signed)
Results faxed to the number provided. Unable to reach pt or leave a message

## 2015-11-12 NOTE — Telephone Encounter (Signed)
Left message for patient, records have been faxed.

## 2015-12-01 DIAGNOSIS — C61 Malignant neoplasm of prostate: Secondary | ICD-10-CM | POA: Diagnosis not present

## 2015-12-07 DIAGNOSIS — C61 Malignant neoplasm of prostate: Secondary | ICD-10-CM | POA: Diagnosis not present

## 2015-12-07 DIAGNOSIS — R3915 Urgency of urination: Secondary | ICD-10-CM | POA: Diagnosis not present

## 2015-12-07 DIAGNOSIS — N401 Enlarged prostate with lower urinary tract symptoms: Secondary | ICD-10-CM | POA: Diagnosis not present

## 2016-03-01 DIAGNOSIS — J209 Acute bronchitis, unspecified: Secondary | ICD-10-CM | POA: Diagnosis not present
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
## 2016-03-18 ENCOUNTER — Encounter: Payer: Self-pay | Admitting: Neurology

## 2016-03-18 ENCOUNTER — Ambulatory Visit (INDEPENDENT_AMBULATORY_CARE_PROVIDER_SITE_OTHER): Payer: Medicare Other | Admitting: Neurology

## 2016-03-18 VITALS — BP 120/80 | HR 72 | Ht 68.5 in | Wt 215.0 lb

## 2016-03-18 DIAGNOSIS — G609 Hereditary and idiopathic neuropathy, unspecified: Secondary | ICD-10-CM | POA: Diagnosis not present

## 2016-03-18 DIAGNOSIS — G250 Essential tremor: Secondary | ICD-10-CM | POA: Diagnosis not present

## 2016-03-18 NOTE — Patient Instructions (Signed)
Bladder medication: Myrbetriq

## 2016-03-23 ENCOUNTER — Other Ambulatory Visit: Payer: Self-pay | Admitting: *Deleted

## 2016-03-23 MED ORDER — METOPROLOL SUCCINATE ER 50 MG PO TB24: 50.0000 mg | ORAL_TABLET | Freq: Every day | ORAL | 1 refills | Status: DC

## 2016-03-23 NOTE — Telephone Encounter (Signed)
PT CALLED STATING THAT [facility] ONLY HAD 25 MG METOPROLOL, HE TAKES 50 MG QD, WANTED TO KNOW IF WE WOULD CHANGE IT, I CALLED PHARMACY TO SEE IF THEY CARRY THE 50 MG AT ALL BEFORE SENDING TO DOCTOR.  PER Travis Morris, THEY HAVE 50 MG METOPROLOL IN STOCK, IN THE COURSE OF THE CONVERSATION I FOUND THAT Travis Morris SENT IN A RX FOR 25 MG METOPROLOL IN JAN, WILL NOW CALL PT.  INFORMED PT TO CALL DR Morris AND FIND OUT IF HE IS SUPPOSE TO BE ON 25 MG AS THAT IS THE MOST RECENT RX [facility] HAS, PT EXPRESSED UNDERSTANDING. I WILL CALL AS WELL AND FOLLOW UP.  CALLED [facility] AND SPOKE WITH Travis Morris, SHE CONFIRMED THAT HE WAS TAKING METOPROLOL 25 MG ER(LAST REFILL WAS JUN 2017)SHE WENT AND TALKED WITH Travis Morris & CONFIRMED THAT PT IS ON METOPROLOL 50 MG ER QD PER OUR LAST RX IN AUG 2017 BY Travis Morris, DUE TO PT INFORMING THEM OF THIS AT LAST OV, DR Morris HAS NOT CHANGED HIS MEDICATION. DR Morris WILL NOT FILL THE METOPROLOL NOW THAT HE IS SEEING [place]. WILL CALL THE PT AND HAVE HIM CONTINUE HIS CURRENT MEDICATION.  METOPROLOL 50 MG ER QD WAS CONFIRMED BY PT & DR Morris'S OFFICE. SENDING NEW RX.

## 2016-05-19 DIAGNOSIS — I10 Essential (primary) hypertension: Secondary | ICD-10-CM | POA: Diagnosis not present

## 2016-05-19 DIAGNOSIS — R7309 Other abnormal glucose: Secondary | ICD-10-CM | POA: Diagnosis not present

## 2016-05-19 DIAGNOSIS — E785 Hyperlipidemia, unspecified: Secondary | ICD-10-CM | POA: Diagnosis not present

## 2016-05-19 DIAGNOSIS — Z0000 Encounter for general adult medical examination without abnormal findings: Secondary | ICD-10-CM | POA: Diagnosis not present

## 2016-05-19 DIAGNOSIS — C61 Malignant neoplasm of prostate: Secondary | ICD-10-CM | POA: Diagnosis not present

## 2016-05-30 DIAGNOSIS — C61 Malignant neoplasm of prostate: Secondary | ICD-10-CM | POA: Diagnosis not present

## 2016-06-08 DIAGNOSIS — N401 Enlarged prostate with lower urinary tract symptoms: Secondary | ICD-10-CM | POA: Diagnosis not present

## 2016-06-08 DIAGNOSIS — R3915 Urgency of urination: Secondary | ICD-10-CM | POA: Diagnosis not present

## 2016-06-08 DIAGNOSIS — C61 Malignant neoplasm of prostate: Secondary | ICD-10-CM | POA: Diagnosis not present

## 2016-07-27 DIAGNOSIS — I959 Hypotension, unspecified: Secondary | ICD-10-CM | POA: Diagnosis not present

## 2016-09-23 ENCOUNTER — Other Ambulatory Visit: Payer: Self-pay | Admitting: Cardiology

## 2016-09-23 NOTE — Telephone Encounter (Signed)
Rx has been sent to the pharmacy electronically. ° °

## 2016-10-07 DIAGNOSIS — Z23 Encounter for immunization: Secondary | ICD-10-CM | POA: Diagnosis not present

## 2016-11-30 DIAGNOSIS — E785 Hyperlipidemia, unspecified: Secondary | ICD-10-CM | POA: Diagnosis not present

## 2016-11-30 DIAGNOSIS — I10 Essential (primary) hypertension: Secondary | ICD-10-CM | POA: Diagnosis not present

## 2016-11-30 DIAGNOSIS — R7309 Other abnormal glucose: Secondary | ICD-10-CM | POA: Diagnosis not present

## 2016-12-15 DIAGNOSIS — C61 Malignant neoplasm of prostate: Secondary | ICD-10-CM | POA: Diagnosis not present

## 2016-12-21 ENCOUNTER — Other Ambulatory Visit: Payer: Self-pay | Admitting: Cardiology

## 2016-12-21 DIAGNOSIS — R3915 Urgency of urination: Secondary | ICD-10-CM | POA: Diagnosis not present

## 2016-12-21 DIAGNOSIS — C61 Malignant neoplasm of prostate: Secondary | ICD-10-CM | POA: Diagnosis not present

## 2016-12-21 DIAGNOSIS — N401 Enlarged prostate with lower urinary tract symptoms: Secondary | ICD-10-CM | POA: Diagnosis not present

## 2016-12-23 DIAGNOSIS — R05 Cough: Secondary | ICD-10-CM | POA: Diagnosis not present

## 2016-12-23 DIAGNOSIS — R062 Wheezing: Secondary | ICD-10-CM | POA: Diagnosis not present

## 2017-01-19 ENCOUNTER — Other Ambulatory Visit: Payer: Self-pay | Admitting: Cardiology

## 2017-01-26 ENCOUNTER — Other Ambulatory Visit: Payer: Self-pay | Admitting: Urology

## 2017-01-26 DIAGNOSIS — R3915 Urgency of urination: Secondary | ICD-10-CM | POA: Diagnosis not present

## 2017-01-26 DIAGNOSIS — C61 Malignant neoplasm of prostate: Secondary | ICD-10-CM

## 2017-02-13 ENCOUNTER — Ambulatory Visit
Admission: RE | Admit: 2017-02-13 | Discharge: 2017-02-13 | Disposition: A | Discharge status: Home / Self Care | Payer: Medicare Other | Source: Ambulatory Visit | Attending: Urology | Admitting: Urology

## 2017-02-13 DIAGNOSIS — C61 Malignant neoplasm of prostate: Secondary | ICD-10-CM | POA: Diagnosis not present

## 2017-02-13 MED ORDER — GADOBENATE DIMEGLUMINE 529 MG/ML IV SOLN
19.0000 mL | Freq: Once | INTRAVENOUS | Status: AC | PRN
  Administered 2017-02-13: 19 mL via INTRAVENOUS

## 2017-02-15 ENCOUNTER — Other Ambulatory Visit: Payer: Self-pay | Admitting: *Deleted

## 2017-02-15 ENCOUNTER — Other Ambulatory Visit: Payer: Self-pay

## 2017-02-15 MED ORDER — METOPROLOL SUCCINATE ER 50 MG PO TB24: 50.0000 mg | ORAL_TABLET | Freq: Every day | ORAL | 0 refills | Status: DC

## 2017-02-15 NOTE — Telephone Encounter (Signed)
Pharmacy requesting a ninety day rx. 

## 2017-03-08 ENCOUNTER — Other Ambulatory Visit: Payer: Self-pay | Admitting: Cardiology

## 2017-03-09 NOTE — Telephone Encounter (Signed)
REFILL 

## 2017-03-16 DIAGNOSIS — R3912 Poor urinary stream: Secondary | ICD-10-CM | POA: Diagnosis not present

## 2017-03-16 DIAGNOSIS — R972 Elevated prostate specific antigen [PSA]: Secondary | ICD-10-CM | POA: Diagnosis not present

## 2017-03-16 DIAGNOSIS — C61 Malignant neoplasm of prostate: Secondary | ICD-10-CM | POA: Diagnosis not present

## 2017-03-22 DIAGNOSIS — E119 Type 2 diabetes mellitus without complications: Secondary | ICD-10-CM | POA: Diagnosis not present

## 2017-03-22 DIAGNOSIS — E785 Hyperlipidemia, unspecified: Secondary | ICD-10-CM | POA: Diagnosis not present

## 2017-03-22 DIAGNOSIS — G629 Polyneuropathy, unspecified: Secondary | ICD-10-CM | POA: Diagnosis not present

## 2017-03-22 DIAGNOSIS — I10 Essential (primary) hypertension: Secondary | ICD-10-CM | POA: Diagnosis not present

## 2017-05-15 DIAGNOSIS — J209 Acute bronchitis, unspecified: Secondary | ICD-10-CM | POA: Diagnosis not present

## 2017-05-15 DIAGNOSIS — R1310 Dysphagia, unspecified: Secondary | ICD-10-CM | POA: Diagnosis not present
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
## 2017-05-19 ENCOUNTER — Ambulatory Visit (INDEPENDENT_AMBULATORY_CARE_PROVIDER_SITE_OTHER): Payer: Medicare Other | Admitting: Neurology

## 2017-05-19 ENCOUNTER — Encounter: Payer: Self-pay | Admitting: Neurology

## 2017-05-19 VITALS — BP 140/80 | HR 60 | Ht 68.0 in | Wt 205.0 lb

## 2017-05-19 DIAGNOSIS — G250 Essential tremor: Secondary | ICD-10-CM

## 2017-05-19 DIAGNOSIS — G609 Hereditary and idiopathic neuropathy, unspecified: Secondary | ICD-10-CM

## 2017-05-25 ENCOUNTER — Other Ambulatory Visit: Payer: Self-pay | Admitting: Gastroenterology

## 2017-05-25 DIAGNOSIS — R1310 Dysphagia, unspecified: Secondary | ICD-10-CM | POA: Diagnosis not present

## 2017-05-26 ENCOUNTER — Ambulatory Visit
Admission: RE | Admit: 2017-05-26 | Discharge: 2017-05-26 | Disposition: A | Discharge status: Home / Self Care | Payer: Medicare Other | Source: Ambulatory Visit | Attending: Gastroenterology | Admitting: Gastroenterology

## 2017-05-26 DIAGNOSIS — R1310 Dysphagia, unspecified: Secondary | ICD-10-CM | POA: Diagnosis not present

## 2017-05-26 DIAGNOSIS — K449 Diaphragmatic hernia without obstruction or gangrene: Secondary | ICD-10-CM | POA: Diagnosis not present

## 2017-06-15 DIAGNOSIS — E785 Hyperlipidemia, unspecified: Secondary | ICD-10-CM | POA: Diagnosis not present

## 2017-06-15 DIAGNOSIS — I10 Essential (primary) hypertension: Secondary | ICD-10-CM | POA: Diagnosis not present

## 2017-06-15 DIAGNOSIS — Z0000 Encounter for general adult medical examination without abnormal findings: Secondary | ICD-10-CM | POA: Diagnosis not present

## 2017-06-15 DIAGNOSIS — C61 Malignant neoplasm of prostate: Secondary | ICD-10-CM | POA: Diagnosis not present

## 2017-06-15 DIAGNOSIS — R7309 Other abnormal glucose: Secondary | ICD-10-CM | POA: Diagnosis not present

## 2017-06-15 DIAGNOSIS — Z23 Encounter for immunization: Secondary | ICD-10-CM | POA: Diagnosis not present

## 2017-08-09 DIAGNOSIS — K219 Gastro-esophageal reflux disease without esophagitis: Secondary | ICD-10-CM | POA: Diagnosis not present

## 2017-09-04 DIAGNOSIS — M25552 Pain in left hip: Secondary | ICD-10-CM | POA: Diagnosis not present

## 2017-09-13 DIAGNOSIS — Z23 Encounter for immunization: Secondary | ICD-10-CM | POA: Diagnosis not present

## 2017-09-13 DIAGNOSIS — M792 Neuralgia and neuritis, unspecified: Secondary | ICD-10-CM | POA: Diagnosis not present

## 2017-09-13 DIAGNOSIS — E1140 Type 2 diabetes mellitus with diabetic neuropathy, unspecified: Secondary | ICD-10-CM | POA: Diagnosis not present

## 2017-09-13 DIAGNOSIS — M25559 Pain in unspecified hip: Secondary | ICD-10-CM | POA: Diagnosis not present

## 2017-10-06 ENCOUNTER — Encounter: Payer: Self-pay | Admitting: Physical Therapy

## 2017-10-06 ENCOUNTER — Other Ambulatory Visit: Payer: Self-pay

## 2017-10-06 ENCOUNTER — Ambulatory Visit: Payer: Medicare Other | Attending: Family Medicine | Admitting: Physical Therapy

## 2017-10-06 DIAGNOSIS — M6281 Muscle weakness (generalized): Secondary | ICD-10-CM | POA: Diagnosis not present

## 2017-10-06 DIAGNOSIS — M5432 Sciatica, left side: Secondary | ICD-10-CM | POA: Insufficient documentation

## 2017-10-06 NOTE — Therapy (Signed)
[facility] [address] [place], [place], [address] Phone: [phone]   Fax:  [phone]  Physical Therapy Evaluation  Patient Details  Name: Travis Morris MRN: [id number] Date of Birth: 1940-02-05 Referring Provider: Dr. Orland Mustard   Encounter Date: 10/06/2017  PT End of Session - 10/06/17 1321    Visit Number  1    Date for PT Re-Evaluation  12/01/17    Authorization Type  Medicare    PT Start Time  0930    PT Stop Time  1015    PT Time Calculation (min)  45 min    Activity Tolerance  Patient tolerated treatment well       Past Medical History:  Diagnosis Date  . BPH with urinary obstruction   . GERD (gastroesophageal reflux disease)    ESOPHAGEAL REFLUX HX  . History of balanitis   . History of phimosis   . History of repair of rotator cuff   . Hx of biopsy    PENIS CUTANEOUS HX  . Hypercholesteremia   . Hypertension   . Macular hole    AIR FLUID EXCHANGE   . Organic impotence   . Prostate cancer [facility])     Past Surgical History:  Procedure Laterality Date  . APPENDECTOMY    . PARS PLANA VITRECTOMY W/ REPAIR OF MACULAR HOLE     x3  . ROTATOR CUFF REPAIR Right   . UVULOPLASTY     LASER ASSISTED    There were no vitals filed for this visit.   Subjective Assessment - 10/06/17 0935    Subjective  I have a sciatic nerve issue since the last week of August; left buttock pain to left lower leg.  I was lifting the trailier on and off the hitch.  No pain in back.   Constantly.      Pertinent History  neuropathy in both feet    Limitations  House hold activities    How long can you sit comfortably?  as long as I want    How long can you walk comfortably?  doesn't change     Diagnostic tests  none    Patient Stated Goals  get rid of the pain     Currently in Pain?  Yes    Pain Score  6     Pain Location  Buttocks    Pain Orientation  Left    Pain Type  Acute pain    Pain Onset  More than a  month ago    Pain Frequency  Constant    Aggravating Factors   mornings; left sidelying     Pain Relieving Factors  Tylenol, Advil          [facility] PT Assessment - 10/06/17 0001      Assessment   Medical Diagnosis  pain in hip     Referring Provider  Dr. Orland Mustard    Onset Date/Surgical Date  --   end of Aug   Next MD Visit  as needed    Prior Therapy  shoulder pain in the past had PT      Precautions   Precautions  None      Restrictions   Weight Bearing Restrictions  No      Balance Screen   Has the patient fallen in the past 6 months  No    Has the patient had a decrease in activity level because of a fear of falling?   No  Is the patient reluctant to leave their home because of a fear of falling?   No      Home Film/video editor residence    Living Arrangements  Spouse/significant other    Type of [place] to enter    Entrance Stairs-Number of Steps  1    [place]  Two level      Prior Function   Vocation  Retired    Leisure  ice curling       Observation/Other Assessments   Focus on Therapeutic Outcomes (FOTO)   48% limitation       Posture/Postural Control   Posture/Postural Control  Postural limitations    Postural Limitations  Decreased lumbar lordosis      AROM   Right Hip Extension  5    Right Hip Flexion  100    Right Hip External Rotation   30    Right Hip Internal Rotation   10    Left Hip Extension  5    Left Hip Flexion  100    Left Hip External Rotation   25    Left Hip Internal Rotation   5    Lumbar Flexion  WFLs    Lumbar Extension  WFLs    Lumbar - Right Side Bend  WFLs    Lumbar - Left Side Bend  [facility]      Strength   Right Hip Flexion  5/5    Right Hip Extension  4+/5    Right Hip ABduction  4+/5    Left Hip Flexion  4+/5    Left Hip Extension  4-/5    Left Hip ABduction  4-/5    Lumbar Flexion  4/5    Lumbar Extension  4/5      Flexibility   Soft Tissue Assessment /Muscle  Length  --   decreased left HS length 60     Palpation   Palpation comment  tender points in piriformis      Special Tests   Other special tests  unable to SLS on left for any length of time       Slump test   Findings  Negative      Prone Knee Bend Test   Findings  Negative      Straight Leg Raise   Findings  Positive      Trendelenburg Test   Findings  Positive    Side  Left      Hip Scouring   Findings  Negative      Ambulation/Gait   Assistive device  None    Gait Pattern  Decreased stance time - left    Gait Comments  waddling gait                Objective measurements completed on examination: See above findings.              PT Education - 10/06/17 1321    Education Details   Access Code: [email or web address]  supine and seated neural flossing; supine piriformis    Person(s) Educated  Patient    Methods  Explanation;Demonstration;Handout    Comprehension  Returned demonstration;Verbalized understanding       PT Short Term Goals - 10/06/17 1541      PT SHORT TERM GOAL #1   Title  The patient will demonstrate compliance with initial HEP and basic self care strategies to promote healing.  Time  4    Period  Weeks    Status  New    Target Date  11/03/17      PT SHORT TERM GOAL #2   Title  The patient will report a 30% improvement in left LE pain with usual ADLs    Time  4    Period  Weeks    Status  New      PT SHORT TERM GOAL #3   Title  The patient will have improved left HS length to 70 degrees needed for dressing, usual ADLs and return to curling    Time  4    Period  Weeks    Status  New        PT Long Term Goals - 10/06/17 1604      PT LONG TERM GOAL #1   Title  The patient will be independent with safe self progression of HEP     Time  8    Period  Weeks    Status  New    Target Date  12/01/17      PT LONG TERM GOAL #2   Title  The patient will report a 60% reduction in left LE pain with usual ADLS    Time  8     Period  Weeks    Status  New      PT LONG TERM GOAL #3   Title  The patient will have improved left LE strength to grossly 4+/5 needed to carry an object up and the steps safely     Time  8    Period  Weeks    Status  New      PT LONG TERM GOAL #4   Title  The patient will have improved left HS length to 75 degrees needed for return to curling     Time  8    Period  Weeks    Status  New      PT LONG TERM GOAL #5   Title  FOTO functional outcome score improved from 48% to 37% indicating improved function with less pain     Time  8    Period  Weeks    Status  New             Plan - 10/06/17 1013    Clinical Impression Statement  The patient complains of left buttock pain that radiates down his left lower leg since the last week of August.   He reports the pain is the result of lifting the trailer on and off the hitch.  No pain in the back and no previous history of back pain.  His left LE pain is constant and unchanged with sitting, standing and walking.  He reports he does not trust his left leg going up and down the steps when he is carrying something.  He sits down for dressing (putting on his pants.)  He is concerned about returning to the  sport of curling on ice at the end of September.     The patient reports no pain with lumbar ROM and grossly WFLs although straight leg bending over is limited in ROM.  Hip ROM is WFLs except slight limitation with hip internal and external ROM. Decreased HS length on left.  + slump test and SLR test on left.  Decreased hip abduction and extension strength on left.  He would benefit from PT to address these deficits.       History and  Personal Factors relevant to plan of care:  some co-morbidities;  good psychosocial support    Clinical Presentation  Stable    Clinical Decision Making  Low    Rehab Potential  Good    PT Frequency  2x / week    PT Duration  8 weeks    PT Treatment/Interventions  ADLs/Self Care Home Management;Iontophoresis  4mg /ml Dexamethasone;Cryotherapy;Electrical Stimulation;Ultrasound;Traction;Therapeutic exercise;Therapeutic activities;Neuromuscular re-education;Patient/family education;Manual techniques;Dry needling;Taping    PT Next Visit Plan  DN to left gluteals, piriformis;  STW; left hip joint mobs; left hip strengthening;   neural flossing;  ES/heat    PT Home Exercise Plan   Access Code: [email or web address] seated and supine floss; supine piriformis stretch     Consulted and Agree with Plan of Care  Patient       Patient will benefit from skilled therapeutic intervention in order to improve the following deficits and impairments:  Pain, Increased fascial restricitons, Decreased range of motion, Decreased strength  Visit Diagnosis: Sciatica, left side - Plan: PT plan of care cert/re-cert  Muscle weakness (generalized) - Plan: PT plan of care cert/re-cert     Problem List Patient Active Problem List   Diagnosis Date Noted  . Essential tremor 08/11/2015  . Male erectile dysfunction 07/16/2015  . Other abnormal glucose 07/16/2015  . Hyperlipidemia 07/16/2015  . Gastro-esophageal reflux disease without esophagitis 07/16/2015  . Essential (primary) hypertension 07/16/2015  . Malignant neoplasm of prostate ([place]) 07/16/2015  . Tremor 07/16/2015  . Vertigo 08/24/2014  . History of hypercholesterolemia 08/24/2014  . Sinus bradycardia 08/24/2014  . TIA (transient ischemic attack)   . Prostate cancer [facility])    [name], PT 10/06/17 4:13 PM Phone: [phone] Fax: [phone]  [name] 10/06/2017, 4:12 PM  [facility] [address] [place], [place], [address] Phone: [phone]   Fax:  [phone]  Name: Travis Morris MRN: [id number] Date of Birth: 12-15-1940

## 2017-10-06 NOTE — Patient Instructions (Signed)
Access Code: [email or web address]  URL: [email or web address]  Date: 10/06/2017  Prepared by: [name]   Exercises  Seated Slump Nerve Glide - 10 reps - 1 sets - 2-3x daily - 7x weekly  Supine LE Neural Mobilization - 10 reps - 1 sets - 2x daily - 7x weekly  Supine Piriformis Stretch - 10 reps - 3 sets - 1x daily - 7x weekly  Supine Piriformis Stretch - 10 reps - 3 sets - 1x daily - 7x weekly  Supine Piriformis Stretch - 3 reps - 1 sets - 30 hold - 2-3x daily - 7x weekly

## 2017-10-12 ENCOUNTER — Ambulatory Visit: Payer: Medicare Other | Admitting: Physical Therapy

## 2017-10-12 ENCOUNTER — Encounter: Payer: Self-pay | Admitting: Physical Therapy

## 2017-10-12 DIAGNOSIS — M6281 Muscle weakness (generalized): Secondary | ICD-10-CM | POA: Diagnosis not present

## 2017-10-12 DIAGNOSIS — M5432 Sciatica, left side: Secondary | ICD-10-CM | POA: Diagnosis not present

## 2017-10-12 NOTE — Therapy (Signed)
[facility] [address], [place], [place], [address] Phone: [phone]   Fax:  [phone]  Physical Therapy Treatment  Patient Details  Name: Travis Morris MRN: [id number] Date of Birth: [birth year] Referring Provider (PT): Dr. Orland Mustard   Encounter Date: 10/12/2017  PT End of Session - 10/12/17 1535    Visit Number  2    Date for PT Re-Evaluation  12/01/17    Authorization Type  Medicare    PT Start Time  1532    PT Stop Time  1275    PT Time Calculation (min)  45 min    Activity Tolerance  Patient tolerated treatment well;No increased pain    Behavior During Therapy  WFL for tasks assessed/performed       Past Medical History:  Diagnosis Date  . BPH with urinary obstruction   . GERD (gastroesophageal reflux disease)    ESOPHAGEAL REFLUX HX  . History of balanitis   . History of phimosis   . History of repair of rotator cuff   . Hx of biopsy    PENIS CUTANEOUS HX  . Hypercholesteremia   . Hypertension   . Macular hole    AIR FLUID EXCHANGE   . Organic impotence   . Prostate cancer [facility])     Past Surgical History:  Procedure Laterality Date  . APPENDECTOMY    . PARS PLANA VITRECTOMY W/ REPAIR OF MACULAR HOLE     x3  . ROTATOR CUFF REPAIR Right   . UVULOPLASTY     LASER ASSISTED    There were no vitals filed for this visit.  Subjective Assessment - 10/12/17 1535    Subjective  Pt reports he hasn't had to take pain medicine for 2 days.  He is now able to sleep on both sides, without too much discomfort.  He'd like to return to curling as soon as possible.      Patient Stated Goals  get rid of the pain     Currently in Pain?  Yes    Pain Score  1     Pain Location  Hip    Pain Orientation  Left    Pain Descriptors / Indicators  --   slightly noticable   Aggravating Factors   lifting    Pain Relieving Factors  exercises and not lifting        [facility] Adult PT Treatment/Exercise - 10/12/17  0001      Self-Care   Self-Care  Other Self-Care Comments    Other Self-Care Comments   Educated pt on self massage to Lt lumbar and gluteal region, to decrease fascial tightness; pt verbalized understanding.       Exercises   Exercises  Knee/Hip      Knee/Hip Exercises: Stretches   Passive Hamstring Stretch  Right;Left;2 reps;30 seconds   supine with strap   Gastroc Stretch  Right;Left;2 reps;30 seconds    Other Knee/Hip Stretches  seated slump nerve glide x 10 reps    Other Knee/Hip Stretches  supine LE neural mobilization (per HEP) x 10 reps - with cues for improved form.       Knee/Hip Exercises: Aerobic   Nustep  L2: 5 min    PTA present to discuss progress and monitor     Knee/Hip Exercises: Standing   Other Standing Knee Exercises  split squats with limited depth (to simulate curling position) x 3 reps each leg - with unilateral UE support.  Knee/Hip Exercises: Supine   Bridges  Both;1 set;10 reps      Knee/Hip Exercises: Sidelying   Hip ABduction  Left;1 set;10 reps    Clams  Lt : 10 reps               PT Short Term Goals - 10/12/17 1722      PT SHORT TERM GOAL #1   Title  The patient will demonstrate compliance with initial HEP and basic self care strategies to promote healing.      Time  4    Period  Weeks    Status  On-going      PT SHORT TERM GOAL #2   Title  The patient will report a 30% improvement in left LE pain with usual ADLs    Time  4    Period  Weeks    Status  Achieved      PT SHORT TERM GOAL #3   Title  The patient will have improved left HS length to 70 degrees needed for dressing, usual ADLs and return to curling    Time  4    Period  Weeks    Status  On-going        PT Long Term Goals - 10/06/17 1604      PT LONG TERM GOAL #1   Title  The patient will be independent with safe self progression of HEP     Time  8    Period  Weeks    Status  New    Target Date  12/01/17      PT LONG TERM GOAL #2   Title  The patient  will report a 60% reduction in left LE pain with usual ADLS    Time  8    Period  Weeks    Status  New      PT LONG TERM GOAL #3   Title  The patient will have improved left LE strength to grossly 4+/5 needed to carry an object up and the steps safely     Time  8    Period  Weeks    Status  New      PT LONG TERM GOAL #4   Title  The patient will have improved left HS length to 75 degrees needed for return to curling     Time  8    Period  Weeks    Status  New      PT LONG TERM GOAL #5   Title  FOTO functional outcome score improved from 48% to 37% indicating improved function with less pain     Time  8    Period  Weeks    Status  New            Plan - 10/12/17 1715    Clinical Impression Statement  Pt has had positive response to last treatment and HEP. Pt reporting at least 50% reduction of pain in Lt hip.  He needed minor cues on exercises, and tolerated all of them well without any increase in symptoms.  Pt has met STG #2 and is progressing well towards remaining goals.      Rehab Potential  Good    PT Frequency  2x / week    PT Duration  8 weeks    PT Treatment/Interventions  ADLs/Self Care Home Management;Iontophoresis 42m/ml Dexamethasone;Cryotherapy;Electrical Stimulation;Ultrasound;Traction;Therapeutic exercise;Therapeutic activities;Neuromuscular re-education;Patient/family education;Manual techniques;Dry needling;Taping    PT Next Visit Plan  continue to progress Lt hip strengthening /  stretching.  manual/ DN as indicated.     PT Home Exercise Plan   Access Code: [email or web address]     Consulted and Agree with Plan of Care  Patient       Patient will benefit from skilled therapeutic intervention in order to improve the following deficits and impairments:  Pain, Increased fascial restricitons, Decreased range of motion, Decreased strength  Visit Diagnosis: Sciatica, left side  Muscle weakness (generalized)     Problem List Patient Active Problem List   Diagnosis  Date Noted  . Essential tremor 08/11/2015  . Male erectile dysfunction 07/16/2015  . Other abnormal glucose 07/16/2015  . Hyperlipidemia 07/16/2015  . Gastro-esophageal reflux disease without esophagitis 07/16/2015  . Essential (primary) hypertension 07/16/2015  . Malignant neoplasm of prostate ([place]) 07/16/2015  . Tremor 07/16/2015  . Vertigo 08/24/2014  . History of hypercholesterolemia 08/24/2014  . Sinus bradycardia 08/24/2014  . TIA (transient ischemic attack)   . Prostate cancer [facility])    Kerin Perna, PTA 10/12/17 5:23 PM   [facility] [address], [place], [place], [address] Phone: [phone]   Fax:  [phone]  Name: Travis Morris MRN: [id number] Date of Birth: 11/19/1940

## 2017-10-16 ENCOUNTER — Ambulatory Visit: Payer: Medicare Other

## 2017-10-16 DIAGNOSIS — M5432 Sciatica, left side: Secondary | ICD-10-CM | POA: Diagnosis not present

## 2017-10-16 DIAGNOSIS — M6281 Muscle weakness (generalized): Secondary | ICD-10-CM | POA: Diagnosis not present

## 2017-10-16 NOTE — Therapy (Signed)
[facility] [address] [place], [place], [address] Phone: [phone]   Fax:  [phone]  Physical Therapy Treatment  Patient Details  Name: Travis Morris MRN: [id number] Date of Birth: 03-Dec-1940 Referring Provider (PT): Dr. Orland Mustard   Encounter Date: 10/16/2017  PT End of Session - 10/16/17 1100    Visit Number  3    Date for PT Re-Evaluation  12/01/17    Authorization Type  Medicare    PT Start Time  1018    PT Stop Time  1100    PT Time Calculation (min)  42 min    Activity Tolerance  Patient tolerated treatment well;No increased pain    Behavior During Therapy  WFL for tasks assessed/performed       Past Medical History:  Diagnosis Date  . BPH with urinary obstruction   . GERD (gastroesophageal reflux disease)    ESOPHAGEAL REFLUX HX  . History of balanitis   . History of phimosis   . History of repair of rotator cuff   . Hx of biopsy    PENIS CUTANEOUS HX  . Hypercholesteremia   . Hypertension   . Macular hole    AIR FLUID EXCHANGE   . Organic impotence   . Prostate cancer [facility])     Past Surgical History:  Procedure Laterality Date  . APPENDECTOMY    . PARS PLANA VITRECTOMY W/ REPAIR OF MACULAR HOLE     x3  . ROTATOR CUFF REPAIR Right   . UVULOPLASTY     LASER ASSISTED    There were no vitals filed for this visit.  Subjective Assessment - 10/16/17 1022    Subjective  I had a good weekend.  No increased pain, just fatigue.      Currently in Pain?  No/denies                       [facility] Adult PT Treatment/Exercise - 10/16/17 0001      Exercises   Exercises  Knee/Hip;Lumbar      Knee/Hip Exercises: Stretches   Active Hamstring Stretch  Both;3 reps;20 seconds    Passive Hamstring Stretch  Right;Left;2 reps;30 seconds   supine with strap   Other Knee/Hip Stretches  supine LE neural mobilization (per HEP) x 10 reps      Knee/Hip Exercises: Aerobic   Nustep  Level  2x 8 minutes   PT present to discuss progress and monitor     Knee/Hip Exercises: Supine   Bridges  Both;1 set;10 reps      Knee/Hip Exercises: Sidelying   Clams  2x5 Rt and Lt   tactile cues for trunk position     Manual Therapy   Manual Therapy  Soft tissue mobilization    Manual therapy comments  orange roller over Lt gluteals, lateral hamstrings and ITB               PT Short Term Goals - 10/16/17 1023      PT SHORT TERM GOAL #1   Title  The patient will demonstrate compliance with initial HEP and basic self care strategies to promote healing.      Status  Achieved      PT SHORT TERM GOAL #2   Title  The patient will report a 30% improvement in left LE pain with usual ADLs    Baseline  >50%    Status  Achieved        PT Long  Term Goals - 10/16/17 1024      PT LONG TERM GOAL #1   Title  The patient will be independent with safe self progression of HEP     Time  8    Period  Weeks    Status  On-going            Plan - 10/16/17 1037    Clinical Impression Statement  Pt reports > 50% overall improvement in Lt LE pain since the start of care.  Pt has been performing HEP for strength, nerve glides and flexibility and reports that he feels fatigue with strength exercises.  Pt requires tactile cues for core stability with exercise in the clinic today and is performing nerve glides without any cueing required.  Pt demonstrates hip and core weakness with strength exercises today.  Pt will continue to benefit from skilled PT for flexibility, core and hip strength.      PT Frequency  2x / week    PT Duration  8 weeks    PT Treatment/Interventions  ADLs/Self Care Home Management;Iontophoresis 4mg /ml Dexamethasone;Cryotherapy;Electrical Stimulation;Ultrasound;Traction;Therapeutic exercise;Therapeutic activities;Neuromuscular re-education;Patient/family education;Manual techniques;Dry needling;Taping    PT Next Visit Plan  continue to progress Lt hip strengthening /  stretching.  manual/ DN as indicated. hip adductor stretch and hip flexor stretch    PT Home Exercise Plan   Access Code: [email or web address]     Recommended Other Services  initial certification is signed    Consulted and Agree with Plan of Care  Patient       Patient will benefit from skilled therapeutic intervention in order to improve the following deficits and impairments:  Pain, Increased fascial restricitons, Decreased range of motion, Decreased strength  Visit Diagnosis: Sciatica, left side  Muscle weakness (generalized)     Problem List Patient Active Problem List   Diagnosis Date Noted  . Essential tremor 08/11/2015  . Male erectile dysfunction 07/16/2015  . Other abnormal glucose 07/16/2015  . Hyperlipidemia 07/16/2015  . Gastro-esophageal reflux disease without esophagitis 07/16/2015  . Essential (primary) hypertension 07/16/2015  . Malignant neoplasm of prostate ([place]) 07/16/2015  . Tremor 07/16/2015  . Vertigo 08/24/2014  . History of hypercholesterolemia 08/24/2014  . Sinus bradycardia 08/24/2014  . TIA (transient ischemic attack)   . Prostate cancer [facility])     Sigurd Sos, PT 10/16/17 11:02 AM  [place] [address] [place], [place], [address] Phone: [phone]   Fax:  [phone]  Name: Travis Morris MRN: [id number] Date of Birth: Mar 10, 1940

## 2017-10-18 ENCOUNTER — Ambulatory Visit: Payer: Medicare Other | Attending: Family Medicine

## 2017-10-18 DIAGNOSIS — M6281 Muscle weakness (generalized): Secondary | ICD-10-CM | POA: Diagnosis not present

## 2017-10-18 DIAGNOSIS — M5432 Sciatica, left side: Secondary | ICD-10-CM | POA: Insufficient documentation

## 2017-10-18 NOTE — Patient Instructions (Signed)
Access Code: [email or web address]  URL: [email or web address]  Date: 10/18/2017  Prepared by: Sigurd Sos   Exercises  Supine Butterfly Groin Stretch - 3 reps - 20 hold - 2x daily - 7x weekly  Supine Hip Flexor Stretch with Weight - 3 reps - 20 hold - 2x daily - 7x weekly  Supine Active Straight Leg Raise - 5 reps - 2 sets - 2x daily - 7x weekly  Sit to Stand without Arm Support - 10 reps - 2 sets - 2x daily - 7x weekly  Seated Hamstring Stretch - 10 reps - 3 sets - 2x daily - 7x weekly

## 2017-10-18 NOTE — Therapy (Signed)
[facility] [address] [place], [place], [address] Phone: [phone]   Fax:  [phone]  Physical Therapy Treatment  Patient Details  Name: Travis Morris MRN: [id number] Date of Birth: October 02, 1940 Referring Provider (PT): Dr. Orland Mustard   Encounter Date: 10/18/2017  PT End of Session - 10/18/17 1440    Visit Number  4    Date for PT Re-Evaluation  12/01/17    Authorization Type  Medicare    PT Start Time  1400    PT Stop Time  1440    PT Time Calculation (min)  40 min    Activity Tolerance  Patient tolerated treatment well;No increased pain    Behavior During Therapy  WFL for tasks assessed/performed       Past Medical History:  Diagnosis Date  . BPH with urinary obstruction   . GERD (gastroesophageal reflux disease)    ESOPHAGEAL REFLUX HX  . History of balanitis   . History of phimosis   . History of repair of rotator cuff   . Hx of biopsy    PENIS CUTANEOUS HX  . Hypercholesteremia   . Hypertension   . Macular hole    AIR FLUID EXCHANGE   . Organic impotence   . Prostate cancer [facility])     Past Surgical History:  Procedure Laterality Date  . APPENDECTOMY    . PARS PLANA VITRECTOMY W/ REPAIR OF MACULAR HOLE     x3  . ROTATOR CUFF REPAIR Right   . UVULOPLASTY     LASER ASSISTED    There were no vitals filed for this visit.  Subjective Assessment - 10/18/17 1354    Subjective  I have been doing my exercises "some"    Currently in Pain?  No/denies                       [facility] Adult PT Treatment/Exercise - 10/18/17 0001      Exercises   Exercises  Knee/Hip;Lumbar      Lumbar Exercises: Stretches   Active Hamstring Stretch  Left;Right;3 reps;20 seconds   seated   Hip Flexor Stretch  3 reps;20 seconds;Left;Right    Other Lumbar Stretch Exercise  butterfly stretch 3x20 seconds      Lumbar Exercises: Supine   Straight Leg Raise  10 reps    Straight Leg Raises Limitations   2x5 bil with quad fatigue      Knee/Hip Exercises: Stretches   Other Knee/Hip Stretches  supine LE neural mobilization (per HEP) x 10 reps      Knee/Hip Exercises: Aerobic   Nustep  Level 2x 8 minutes   PT present to discuss progress and monitor     Knee/Hip Exercises: Seated   Sit to Sand  20 reps;without UE support      Knee/Hip Exercises: Supine   Bridges  Both;1 set;10 reps      Knee/Hip Exercises: Sidelying   Clams  2x5 Rt and Lt   tactile cues for trunk position     Manual Therapy   Manual Therapy  Soft tissue mobilization    Manual therapy comments  orange roller over Lt gluteals, lateral hamstrings and ITB             PT Education - 10/18/17 1419    Education Details   Access Code: [email or web address]     Person(s) Educated  Patient    Methods  Explanation;Demonstration;Handout    Comprehension  Verbalized understanding;Returned demonstration  PT Short Term Goals - 10/16/17 1023      PT SHORT TERM GOAL #1   Title  The patient will demonstrate compliance with initial HEP and basic self care strategies to promote healing.      Status  Achieved      PT SHORT TERM GOAL #2   Title  The patient will report a 30% improvement in left LE pain with usual ADLs    Baseline  >50%    Status  Achieved        PT Long Term Goals - 10/16/17 1024      PT LONG TERM GOAL #1   Title  The patient will be independent with safe self progression of HEP     Time  8    Period  Weeks    Status  On-going            Plan - 10/18/17 1409    Clinical Impression Statement  Pt reports moderate compliance with HEP for flexibility and strength.  Pt demonstrates limited hip flexibility globally and PT focused on adding HEP for flexibility.  Pt requires tactile and verbal cues frequently during session for correct technique.  Pt with chronic stiffness and weakness in the core and hips and will continue to benefit from skilled PT for progression of exercise and manual to address  deficits.       Rehab Potential  Good    PT Frequency  2x / week    PT Duration  8 weeks    PT Treatment/Interventions  ADLs/Self Care Home Management;Iontophoresis 4mg /ml Dexamethasone;Cryotherapy;Electrical Stimulation;Ultrasound;Traction;Therapeutic exercise;Therapeutic activities;Neuromuscular re-education;Patient/family education;Manual techniques;Dry needling;Taping    PT Next Visit Plan  continue to progress Lt hip strengthening / stretching.  manual/ DN as indicated. hip adductor stretch and hip flexor stretch    PT Home Exercise Plan   Access Code: [email or web address]     Consulted and Agree with Plan of Care  Patient       Patient will benefit from skilled therapeutic intervention in order to improve the following deficits and impairments:  Pain, Increased fascial restricitons, Decreased range of motion, Decreased strength  Visit Diagnosis: Sciatica, left side  Muscle weakness (generalized)     Problem List Patient Active Problem List   Diagnosis Date Noted  . Essential tremor 08/11/2015  . Male erectile dysfunction 07/16/2015  . Other abnormal glucose 07/16/2015  . Hyperlipidemia 07/16/2015  . Gastro-esophageal reflux disease without esophagitis 07/16/2015  . Essential (primary) hypertension 07/16/2015  . Malignant neoplasm of prostate ([place]) 07/16/2015  . Tremor 07/16/2015  . Vertigo 08/24/2014  . History of hypercholesterolemia 08/24/2014  . Sinus bradycardia 08/24/2014  . TIA (transient ischemic attack)   . Prostate cancer [facility])      Sigurd Sos, PT 10/18/17 2:41 PM  [place] [facility] [address], [place], [place], [address] Phone: [phone]   Fax:  [phone]  Name: Travis Morris MRN: [id number] Date of Birth: [birth year]

## 2017-10-24 ENCOUNTER — Ambulatory Visit: Payer: Medicare Other | Admitting: Physical Therapy

## 2017-10-24 ENCOUNTER — Encounter: Payer: Self-pay | Admitting: Physical Therapy

## 2017-10-24 DIAGNOSIS — M5432 Sciatica, left side: Secondary | ICD-10-CM | POA: Diagnosis not present

## 2017-10-24 DIAGNOSIS — M6281 Muscle weakness (generalized): Secondary | ICD-10-CM | POA: Diagnosis not present

## 2017-10-24 NOTE — Therapy (Signed)
[facility] [address], [place], [place], [address] Phone: [phone]   Fax:  [phone]  Physical Therapy Treatment  Patient Details  Name: Travis Morris MRN: [id number] Date of Birth: 1940/08/09 Referring Provider (PT): Dr. Orland Mustard   Encounter Date: 10/24/2017  PT End of Session - 10/24/17 1056    Visit Number  5    Date for PT Re-Evaluation  12/01/17    Authorization Type  Medicare    PT Start Time  1020    PT Stop Time  1059    PT Time Calculation (min)  39 min    Activity Tolerance  Patient tolerated treatment well;No increased pain       Past Medical History:  Diagnosis Date  . BPH with urinary obstruction   . GERD (gastroesophageal reflux disease)    ESOPHAGEAL REFLUX HX  . History of balanitis   . History of phimosis   . History of repair of rotator cuff   . Hx of biopsy    PENIS CUTANEOUS HX  . Hypercholesteremia   . Hypertension   . Macular hole    AIR FLUID EXCHANGE   . Organic impotence   . Prostate cancer [facility])     Past Surgical History:  Procedure Laterality Date  . APPENDECTOMY    . PARS PLANA VITRECTOMY W/ REPAIR OF MACULAR HOLE     x3  . ROTATOR CUFF REPAIR Right   . UVULOPLASTY     LASER ASSISTED    There were no vitals filed for this visit.  Subjective Assessment - 10/24/17 1022    Subjective  Intermittent tightness in left lower leg.  No pain.  Avoiding lifting > 20#.  Exercises just make me tired.  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  Has returning to [employer] on Wednesdays.      Pertinent History  neuropathy in both feet    Patient Stated Goals  get rid of the pain     Currently in Pain?  No/denies    Pain Score  0-No pain                       [facility] Adult PT Treatment/Exercise - 10/24/17 0001      Therapeutic Activites    Therapeutic Activities  ADL's    ADL's  getting up from low stool     Lifting   box lift from floor with head up 10x      Lumbar Exercises: Stretches   Active Hamstring Stretch  Left;Right;3 reps;20 seconds   seated   Other Lumbar Stretch Exercise  butterfly stretch 3x20 seconds    Other Lumbar Stretch Exercise  psoas doorway stretch with UE use 3x5 right/left      Lumbar Exercises: Machines for Strengthening   Cybex Knee Extension  15# bil 15x    Cybex Knee Flexion  20# and 25# bil 15x    Leg Press  75# bil 15x; 45# single leg right/left 15x each      Lumbar Exercises: Standing   Heel Raises  15 reps    Other Standing Lumbar Exercises  red band hip extension and abduction 10x each right/left       Lumbar Exercises: Seated   Sit to Stand  5 reps      Knee/Hip Exercises: Stretches   Other Knee/Hip Stretches  supine LE neural mobilization (per HEP) x 10 reps  Knee/Hip Exercises: Aerobic   Nustep  Level 2x 8 minutes   PT present to discuss progress and monitor              PT Short Term Goals - 10/16/17 1023      PT SHORT TERM GOAL #1   Title  The patient will demonstrate compliance with initial HEP and basic self care strategies to promote healing.      Status  Achieved      PT SHORT TERM GOAL #2   Title  The patient will report a 30% improvement in left LE pain with usual ADLs    Baseline  >50%    Status  Achieved        PT Long Term Goals - 10/16/17 1024      PT LONG TERM GOAL #1   Title  The patient will be independent with safe self progression of HEP     Time  8    Period  Weeks    Status  On-going            Plan - 10/24/17 1133    Clinical Impression Statement  The patient is able to progress LE strengthening to facilitate lifting.  He has been avoiding lifting since he attributes his problem to lifting the trailier on/off the hitch.  Instructed in body mechanics with lifting using hip hinge method.  Verbal cues to keep head up in order to mainitain neutral spine position.  He has some tingling in lower extremities on  leg press which he attributes to peripheral neuropathy.  Improving hip flexor and rotator muscle lengths.  Therapist monitoring response with all treatment interventions.      Rehab Potential  Good    PT Frequency  2x / week    PT Duration  8 weeks    PT Treatment/Interventions  ADLs/Self Care Home Management;Iontophoresis 4mg /ml Dexamethasone;Cryotherapy;Electrical Stimulation;Ultrasound;Traction;Therapeutic exercise;Therapeutic activities;Neuromuscular re-education;Patient/family education;Manual techniques;Dry needling;Taping    PT Next Visit Plan  lifting practice;  LE strengthening;   manual/ DN as indicated. hip adductor stretch and hip flexor stretch;  see how Wednesday curling went    PT Home Exercise Plan   Access Code: [email or web address]        Patient will benefit from skilled therapeutic intervention in order to improve the following deficits and impairments:  Pain, Increased fascial restricitons, Decreased range of motion, Decreased strength  Visit Diagnosis: Sciatica, left side  Muscle weakness (generalized)     Problem List Patient Active Problem List   Diagnosis Date Noted  . Essential tremor 08/11/2015  . Male erectile dysfunction 07/16/2015  . Other abnormal glucose 07/16/2015  . Hyperlipidemia 07/16/2015  . Gastro-esophageal reflux disease without esophagitis 07/16/2015  . Essential (primary) hypertension 07/16/2015  . Malignant neoplasm of prostate ([place]) 07/16/2015  . Tremor 07/16/2015  . Vertigo 08/24/2014  . History of hypercholesterolemia 08/24/2014  . Sinus bradycardia 08/24/2014  . TIA (transient ischemic attack)   . Prostate cancer [facility])    [name], PT 10/24/17 11:39 AM Phone: [phone] Fax: [phone]  [name] 10/24/2017, 11:39 AM  [facility] [address] [place], [place], [address] Phone: [phone]   Fax:  [phone]  Name: Travis Morris MRN: [id number] Date  of Birth: 02-Oct-1940

## 2017-10-26 ENCOUNTER — Telehealth: Payer: Self-pay | Admitting: Physical Therapy

## 2017-10-26 ENCOUNTER — Ambulatory Visit: Payer: Medicare Other | Admitting: Physical Therapy

## 2017-10-26 NOTE — Telephone Encounter (Signed)
Left message for patient to call (regarding no show for patient).

## 2017-10-31 ENCOUNTER — Encounter: Payer: Self-pay | Admitting: Physical Therapy

## 2017-10-31 ENCOUNTER — Ambulatory Visit: Payer: Medicare Other | Admitting: Physical Therapy

## 2017-10-31 DIAGNOSIS — M5432 Sciatica, left side: Secondary | ICD-10-CM

## 2017-10-31 DIAGNOSIS — M6281 Muscle weakness (generalized): Secondary | ICD-10-CM

## 2017-10-31 NOTE — Therapy (Signed)
[facility] [address] [place], [place], [address] Phone: [phone]   Fax:  [phone]  Physical Therapy Treatment  Patient Details  Name: Travis Morris MRN: [id number] Date of Birth: 06/27/1940 Referring Provider (PT): Dr. Orland Mustard   Encounter Date: 10/31/2017  PT End of Session - 10/31/17 1043    Visit Number  6    Date for PT Re-Evaluation  12/01/17    Authorization Type  Medicare    PT Start Time  9741    PT Stop Time  1055    PT Time Calculation (min)  40 min    Activity Tolerance  Patient tolerated treatment well;No increased pain       Past Medical History:  Diagnosis Date  . BPH with urinary obstruction   . GERD (gastroesophageal reflux disease)    ESOPHAGEAL REFLUX HX  . History of balanitis   . History of phimosis   . History of repair of rotator cuff   . Hx of biopsy    PENIS CUTANEOUS HX  . Hypercholesteremia   . Hypertension   . Macular hole    AIR FLUID EXCHANGE   . Organic impotence   . Prostate cancer [facility])     Past Surgical History:  Procedure Laterality Date  . APPENDECTOMY    . PARS PLANA VITRECTOMY W/ REPAIR OF MACULAR HOLE     x3  . ROTATOR CUFF REPAIR Right   . UVULOPLASTY     LASER ASSISTED    There were no vitals filed for this visit.  Subjective Assessment - 10/31/17 1016    Subjective  No pain.  Curling is going OK.  Last had pain 3 weeks ago.  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  Denies soreness following last visit.      Pertinent History  neuropathy in both feet    Limitations  House hold activities    Currently in Pain?  No/denies    Pain Score  0-No pain    Pain Location  Hip    Pain Orientation  Left    Aggravating Factors   nothing recently          [facility] PT Assessment - 10/31/17 0001      Flexibility   Soft Tissue Assessment /Muscle Length  yes    Hamstrings  70 bil                   [facility] Adult PT  Treatment/Exercise - 10/31/17 0001      Therapeutic Activites    Lifting  golfer's lift; squat lift with back in neutral       Lumbar Exercises: Stretches   Active Hamstring Stretch  Left;Right;3 reps;20 seconds   seated   Prone Mid Back Stretch Limitations  seated thread the needle 5x    Other Lumbar Stretch Exercise  2nd step rainbows 5x each     Other Lumbar Stretch Exercise  seated sliding heel up shin 5x each side      Lumbar Exercises: Aerobic   UBE (Upper Arm Bike)  2 forward/2 backward L1      Lumbar Exercises: Machines for Strengthening   Leg Press  75# bil 15x; 45# single leg right/left 15x each      Lumbar Exercises: Standing   Lifting  --   golfers lift touching cone on 2nd step    Other Standing Lumbar Exercises  red band diagonal extensions 10x2    Other Standing Lumbar  Exercises  wall slides with green ball 2x5      Lumbar Exercises: Supine   Other Supine Lumbar Exercises  green band dead bugs UE/LE     Other Supine Lumbar Exercises  green band whole leg press down. 10x each leg      Knee/Hip Exercises: Aerobic   Nustep  Level 3x 6 minutes   PT present to discuss progress and monitor              PT Short Term Goals - 10/31/17 1216      PT SHORT TERM GOAL #1   Title  The patient will demonstrate compliance with initial HEP and basic self care strategies to promote healing.      Status  Achieved      PT SHORT TERM GOAL #2   Title  The patient will report a 30% improvement in left LE pain with usual ADLs    Status  Achieved      PT SHORT TERM GOAL #3   Title  The patient will have improved left HS length to 70 degrees needed for dressing, usual ADLs and return to curling    Status  Achieved        PT Long Term Goals - 10/16/17 1024      PT LONG TERM GOAL #1   Title  The patient will be independent with safe self progression of HEP     Time  8    Period  Weeks    Status  On-going            Plan - 10/31/17 1046    Clinical  Impression Statement  The patient reports no back or hip pain produced with exercise.  Verbal and tactile cues for hip hinge with lifting and to hold his head up to avoid excessive spinal flexion with lifting.     He has met all STGS and should meet remaining goals in 2-3 visits.      Rehab Potential  Good    PT Frequency  2x / week    PT Duration  8 weeks    PT Treatment/Interventions  ADLs/Self Care Home Management;Iontophoresis 51m/ml Dexamethasone;Cryotherapy;Electrical Stimulation;Ultrasound;Traction;Therapeutic exercise;Therapeutic activities;Neuromuscular re-education;Patient/family education;Manual techniques;Dry needling;Taping    PT Next Visit Plan  recheck ROM;  lifting practice;  LE strengthening;  core strengthening;  hip adductor stretch and hip flexor stretch;  see how Wednesday curling went    PT Home Exercise Plan   Access Code: [email or web address]       Patient will benefit from skilled therapeutic intervention in order to improve the following deficits and impairments:  Pain, Increased fascial restricitons, Decreased range of motion, Decreased strength  Visit Diagnosis: Sciatica, left side  Muscle weakness (generalized)     Problem List Patient Active Problem List   Diagnosis Date Noted  . Essential tremor 08/11/2015  . Male erectile dysfunction 07/16/2015  . Other abnormal glucose 07/16/2015  . Hyperlipidemia 07/16/2015  . Gastro-esophageal reflux disease without esophagitis 07/16/2015  . Essential (primary) hypertension 07/16/2015  . [place] 07/16[place]2015  . Tremor 07/16/2015  . Vertigo 08/24/2014  . History of hypercholesterolemia 08/24/2014  . Sinus bradycardia 08/24/2014  . TIA (transient ischemic attack)   . Prostate cancer ([facility]    SRuben Im PT 10/31/17 12:18 PM Phone: [phone]: [phone] SAlvera Singh10/15/2019, 12:18 PM  [facility] [address]  [place] [address]: [phone]  Fax:  [phone]  Name: Travis Morris MRN: [id number] Date of Birth: 1940-03-23

## 2017-11-02 ENCOUNTER — Ambulatory Visit: Payer: Medicare Other | Admitting: Physical Therapy

## 2017-11-02 ENCOUNTER — Encounter: Payer: Self-pay | Admitting: Physical Therapy

## 2017-11-02 DIAGNOSIS — M6281 Muscle weakness (generalized): Secondary | ICD-10-CM

## 2017-11-02 DIAGNOSIS — M5432 Sciatica, left side: Secondary | ICD-10-CM | POA: Diagnosis not present

## 2017-11-02 NOTE — Therapy (Signed)
[facility] [address], [place], [place], [address] Phone: [phone]   Fax:  [phone]  Physical Therapy Treatment  Patient Details  Name: Travis Morris MRN: [id number] Date of Birth: 13-Apr-1940 Referring Provider (PT): Dr. Orland Mustard   Encounter Date: 11/02/2017  PT End of Session - 11/02/17 1429    Visit Number  7    Date for PT Re-Evaluation  12/01/17    Authorization Type  Medicare    PT Start Time  1410   pt late   PT Stop Time  1444    PT Time Calculation (min)  34 min    Activity Tolerance  Patient tolerated treatment well;No increased pain       Past Medical History:  Diagnosis Date  . BPH with urinary obstruction   . GERD (gastroesophageal reflux disease)    ESOPHAGEAL REFLUX HX  . History of balanitis   . History of phimosis   . History of repair of rotator cuff   . Hx of biopsy    PENIS CUTANEOUS HX  . Hypercholesteremia   . Hypertension   . Macular hole    AIR FLUID EXCHANGE   . Organic impotence   . Prostate cancer [facility])     Past Surgical History:  Procedure Laterality Date  . APPENDECTOMY    . PARS PLANA VITRECTOMY W/ REPAIR OF MACULAR HOLE     x3  . ROTATOR CUFF REPAIR Right   . UVULOPLASTY     LASER ASSISTED    There were no vitals filed for this visit.  Subjective Assessment - 11/02/17 1413    Subjective  Reports feeling a little "shaky" during curling yesterday but no pain.  I'm gradually recovering a little quicker.  Denies soreness or excessive fatigue following last visit.      Patient Stated Goals  get rid of the pain     Currently in Pain?  No/denies    Pain Score  0-No pain    Pain Location  Hip    Pain Orientation  Left    Pain Onset  More than a month ago         [facility] PT Assessment - 11/02/17 0001      AROM   Right Hip Extension  10    Right Hip External Rotation   35    Left Hip Extension  10    Left Hip External Rotation   35                    [facility] Adult PT Treatment/Exercise - 11/02/17 0001      Lumbar Exercises: Stretches   Other Lumbar Stretch Exercise  butterfly stretch 3x20 seconds   with green ball      Lumbar Exercises: Machines for Strengthening   Leg Press  90# bil 20x; 50# single leg right/left 15x each      Lumbar Exercises: Standing   Other Standing Lumbar Exercises  red band hip extension leaning forward on table 20x each side    Other Standing Lumbar Exercises  floor sliders rainbows 12x right/left       Lumbar Exercises: Seated   Sit to Stand  5 reps   with cane on back for hip hinge cue     Lumbar Exercises: Supine   Other Supine Lumbar Exercises  bridge over green ball 15x    Other Supine Lumbar Exercises  single leg bridge with ball 5x each side  Knee/Hip Exercises: Aerobic   Nustep  Level 3x  8 minutes   PT present to discuss progress and monitor     Knee/Hip Exercises: Standing   Other Standing Knee Exercises  30# resisted walking 15x backward only      Knee/Hip Exercises: Sidelying   Hip ABduction  AROM;Right;Left;15 reps               PT Short Term Goals - 11/02/17 1450      PT SHORT TERM GOAL #1   Title  The patient will demonstrate compliance with initial HEP and basic self care strategies to promote healing.      Status  Achieved      PT SHORT TERM GOAL #2   Title  The patient will report a 30% improvement in left LE pain with usual ADLs    Status  Achieved      PT SHORT TERM GOAL #3   Title  The patient will have improved left HS length to 70 degrees needed for dressing, usual ADLs and return to curling    Status  Achieved        PT Long Term Goals - 10/16/17 1024      PT LONG TERM GOAL #1   Title  The patient will be independent with safe self progression of HEP     Time  8    Period  Weeks    Status  On-going            Plan - 11/02/17 1439    Clinical Impression Statement  The patient is able participate in a progression  of core and LE strengthening exercises without the production of pain.  He has improved hip rotator ROM bilaterally.  Improving hip extension and abduction strength.  Therapist closely monitoring response with all interventions.  He should meet remaining rehab goals in the next 1-2 visits.      Rehab Potential  Good    PT Frequency  2x / week    PT Duration  8 weeks    PT Treatment/Interventions  ADLs/Self Care Home Management;Iontophoresis 4mg /ml Dexamethasone;Cryotherapy;Electrical Stimulation;Ultrasound;Traction;Therapeutic exercise;Therapeutic activities;Neuromuscular re-education;Patient/family education;Manual techniques;Dry needling;Taping    PT Next Visit Plan   resisted walking add forward;   lifting practice;  LE strengthening;  core strengthening;  hip adductor stretch and hip flexor stretch;  see how Wednesday curling went    PT Home Exercise Plan   Access Code: [email or web address]        Patient will benefit from skilled therapeutic intervention in order to improve the following deficits and impairments:  Pain, Increased fascial restricitons, Decreased range of motion, Decreased strength  Visit Diagnosis: Sciatica, left side  Muscle weakness (generalized)     Problem List Patient Active Problem List   Diagnosis Date Noted  . Essential tremor 08/11/2015  . Male erectile dysfunction 07/16/2015  . Other abnormal glucose 07/16/2015  . Hyperlipidemia 07/16/2015  . Gastro-esophageal reflux disease without esophagitis 07/16/2015  . Essential (primary) hypertension 07/16/2015  . Malignant neoplasm of prostate ([place]) 07/16/2015  . Tremor 07/16/2015  . Vertigo 08/24/2014  . History of hypercholesterolemia 08/24/2014  . Sinus bradycardia 08/24/2014  . TIA (transient ischemic attack)   . Prostate cancer [facility])    [name], PT 11/02/17 2:51 PM Phone: [phone] Fax: [phone] [name] 11/02/2017, 2:50 PM  [facility] [address], [place], [place], [address] Phone: [phone]   Fax:  [phone]  Name: Travis Morris MRN:  [id number] Date of Birth: Oct 16, 1940

## 2017-11-07 ENCOUNTER — Encounter: Payer: Self-pay | Admitting: Physical Therapy

## 2017-11-07 ENCOUNTER — Ambulatory Visit: Payer: Medicare Other | Admitting: Physical Therapy

## 2017-11-07 DIAGNOSIS — M6281 Muscle weakness (generalized): Secondary | ICD-10-CM

## 2017-11-07 DIAGNOSIS — M5432 Sciatica, left side: Secondary | ICD-10-CM | POA: Diagnosis not present

## 2017-11-07 NOTE — Therapy (Signed)
[facility] [address] [place], [place], [address] Phone: [phone]   Fax:  [phone]  Physical Therapy Treatment  Patient Details  Name: Travis Morris MRN: [id number] Date of Birth: 1940-01-21 Referring Provider (PT): Dr. Orland Mustard   Encounter Date: 11/07/2017  PT End of Session - 11/07/17 1054    Visit Number  8    Date for PT Re-Evaluation  12/01/17    Authorization Type  Medicare    PT Start Time  9518    PT Stop Time  1056    PT Time Calculation (min)  41 min    Activity Tolerance  Patient tolerated treatment well;No increased pain       Past Medical History:  Diagnosis Date  . BPH with urinary obstruction   . GERD (gastroesophageal reflux disease)    ESOPHAGEAL REFLUX HX  . History of balanitis   . History of phimosis   . History of repair of rotator cuff   . Hx of biopsy    PENIS CUTANEOUS HX  . Hypercholesteremia   . Hypertension   . Macular hole    AIR FLUID EXCHANGE   . Organic impotence   . Prostate cancer [facility])     Past Surgical History:  Procedure Laterality Date  . APPENDECTOMY    . PARS PLANA VITRECTOMY W/ REPAIR OF MACULAR HOLE     x3  . ROTATOR CUFF REPAIR Right   . UVULOPLASTY     LASER ASSISTED    There were no vitals filed for this visit.  Subjective Assessment - 11/07/17 1016    Subjective  Back "is good."  Denies muscle soreness following last visit.  Lower leg pain "not very much" lately.    I need more leg strength.  I don't have the confidence to stand to put my socks on.  Discussed that neuropathy affects balance.      Pertinent History  neuropathy in both feet    Currently in Pain?  No/denies    Pain Score  0-No pain    Pain Location  Hip    Pain Orientation  Left    Pain Type  Acute pain    Aggravating Factors   none noted                        [facility] Adult PT Treatment/Exercise - 11/07/17 0001      Therapeutic Activites    Lifting   golfer's lift; squat lift with back in neutral       Lumbar Exercises: Stretches   Active Hamstring Stretch Limitations  on 2nd step 5x each     Hip Flexor Stretch Limitations  2nd step with bil UE reach overhead 5x       Lumbar Exercises: Aerobic   UBE (Upper Arm Bike)  1 1/2 each way     Nustep  Nu-Step L3 8 min       Lumbar Exercises: Machines for Strengthening   Leg Press  90# bil 20x; 50# single leg right/left 15x each      Lumbar Exercises: Standing   Other Standing Lumbar Exercises  high stepping, side stepping, straight leg walk, toe walk 1-2 laps each     Other Standing Lumbar Exercises  bil UE yellow band extensions with hip lift 10x right/left      Lumbar Exercises: Supine   Bridge  10 reps    Bridge Limitations  blue band around knees  Other Supine Lumbar Exercises  march with blue band around knees 15x    Other Supine Lumbar Exercises  clams blue band 15x      Knee/Hip Exercises: Aerobic   Nustep  Level 3x  8 minutes   PT present to discuss progress and monitor     Knee/Hip Exercises: Standing   Other Standing Knee Exercises  25# resisted walking 4 directions 5x each                PT Short Term Goals - 11/02/17 1450      PT SHORT TERM GOAL #1   Title  The patient will demonstrate compliance with initial HEP and basic self care strategies to promote healing.      Status  Achieved      PT SHORT TERM GOAL #2   Title  The patient will report a 30% improvement in left LE pain with usual ADLs    Status  Achieved      PT SHORT TERM GOAL #3   Title  The patient will have improved left HS length to 70 degrees needed for dressing, usual ADLs and return to curling    Status  Achieved        PT Long Term Goals - 10/16/17 1024      PT LONG TERM GOAL #1   Title  The patient will be independent with safe self progression of HEP     Time  8    Period  Weeks    Status  On-going            Plan - 11/07/17 1054    Clinical Impression Statement   The patient has decreased balance with single leg exercises most likely secondary to peripheral neuropathy but also left LE weakness associated with current problem.  He is able to participate in a moderate intensity core and LE strengthening without complaint of LE symptoms.  He needs moderate verbal cues for proper form with squat lifting method.  Reassess progress toward goals to determine readiness for discharge vs. continuation of PT services.      Rehab Potential  Good    PT Frequency  2x / week    PT Duration  8 weeks    PT Treatment/Interventions  ADLs/Self Care Home Management;Iontophoresis 4mg /ml Dexamethasone;Cryotherapy;Electrical Stimulation;Ultrasound;Traction;Therapeutic exercise;Therapeutic activities;Neuromuscular re-education;Patient/family education;Manual techniques;Dry needling;Taping    PT Next Visit Plan  standing balance;  resisted walking lifting practice;  LE strengthening;  core strengthening;  hip adductor stretch and hip flexor stretch;  see how Wednesday curling went    PT Home Exercise Plan   Access Code: [email or web address]        Patient will benefit from skilled therapeutic intervention in order to improve the following deficits and impairments:  Pain, Increased fascial restricitons, Decreased range of motion, Decreased strength  Visit Diagnosis: Sciatica, left side  Muscle weakness (generalized)     Problem List Patient Active Problem List   Diagnosis Date Noted  . Essential tremor 08/11/2015  . Male erectile dysfunction 07/16/2015  . Other abnormal glucose 07/16/2015  . Hyperlipidemia 07/16/2015  . Gastro-esophageal reflux disease without esophagitis 07/16/2015  . Essential (primary) hypertension 07/16/2015  . Malignant neoplasm of prostate ([place]) 07/16/2015  . Tremor 07/16/2015  . Vertigo 08/24/2014  . History of hypercholesterolemia 08/24/2014  . Sinus bradycardia 08/24/2014  . TIA (transient ischemic attack)   . Prostate cancer [facility])    [name],  PT 11/07/17 5:05 PM Phone: [phone] Fax: [phone]  [name]  11/07/2017, 5:05 PM  [facility] [address], [place], [place], [address] Phone: [phone]   Fax:  [phone]  Name: ARAD BURSTON MRN: [id number] Date of Birth: 12/11/1940

## 2017-11-09 ENCOUNTER — Encounter: Payer: Self-pay | Admitting: Physical Therapy

## 2017-11-09 ENCOUNTER — Ambulatory Visit: Payer: Medicare Other | Admitting: Physical Therapy

## 2017-11-09 DIAGNOSIS — M5432 Sciatica, left side: Secondary | ICD-10-CM

## 2017-11-09 DIAGNOSIS — M6281 Muscle weakness (generalized): Secondary | ICD-10-CM

## 2017-11-09 NOTE — Therapy (Signed)
[facility] [address] [place], [place], [address] Phone: [phone]   Fax:  [phone]  Physical Therapy Treatment/Discharge Summary   Patient Details  Name: Travis Morris MRN: [id number] Date of Birth: 02-08-1940 Referring Provider (PT): Dr. Orland Mustard   Encounter Date: 11/09/2017  PT End of Session - 11/09/17 1450    Visit Number  9    Date for PT Re-Evaluation  12/01/17    Authorization Type  Medicare    PT Start Time  1400    PT Stop Time  1440    PT Time Calculation (min)  40 min    Activity Tolerance  Patient tolerated treatment well       Past Medical History:  Diagnosis Date  . BPH with urinary obstruction   . GERD (gastroesophageal reflux disease)    ESOPHAGEAL REFLUX HX  . History of balanitis   . History of phimosis   . History of repair of rotator cuff   . Hx of biopsy    PENIS CUTANEOUS HX  . Hypercholesteremia   . Hypertension   . Macular hole    AIR FLUID EXCHANGE   . Organic impotence   . Prostate cancer [facility])     Past Surgical History:  Procedure Laterality Date  . APPENDECTOMY    . PARS PLANA VITRECTOMY W/ REPAIR OF MACULAR HOLE     x3  . ROTATOR CUFF REPAIR Right   . UVULOPLASTY     LASER ASSISTED    There were no vitals filed for this visit.  Subjective Assessment - 11/09/17 1402    Subjective  No pain today.  Stiffer in the mornings.  Reports he is about 70% better.      Pertinent History  neuropathy in both feet    Currently in Pain?  No/denies    Pain Score  0-No pain         [facility] PT Assessment - 11/09/17 0001      Observation/Other Assessments   Focus on Therapeutic Outcomes (FOTO)   27% limitation       AROM   Right Hip Extension  10    Right Hip External Rotation   40    Left Hip Extension  10    Left Hip External Rotation   35    Left Hip Internal Rotation   10      Strength   Right Hip Flexion  5/5    Right Hip Extension  5/5    Right Hip  ABduction  5/5    Left Hip Flexion  5/5    Left Hip Extension  4+/5    Left Hip ABduction  4+/5    Lumbar Flexion  4+/5    Lumbar Extension  4+/5           Bil HS length 70 degrees        [facility] Adult PT Treatment/Exercise - 11/09/17 0001      Self-Care   Other Self-Care Comments   discussed walking program    discussed community gym options     Therapeutic Activites    Lifting  lunge for curling;  mini squat       Lumbar Exercises: Stretches   Active Hamstring Stretch  Left;Right;3 reps;20 seconds   seated and supine   Piriformis Stretch  Right;Left;3 reps    Piriformis Stretch Limitations  seated and supine      Lumbar Exercises: Aerobic   Nustep  Nu-Step L3 8 min  Lumbar Exercises: Machines for Strengthening   Leg Press  100# bil 20x; 60# single leg 20x       Lumbar Exercises: Standing   Other Standing Lumbar Exercises  mini lunge with countertop support     Other Standing Lumbar Exercises  sit to stand without UEs and demo of wall slides       Lumbar Exercises: Supine   Bridge  5 reps             PT Education - 11/09/17 1450    Education Details   Access Code: [email or web address]     Person(s) Educated  Patient    Methods  Explanation;Demonstration;Handout    Comprehension  Returned demonstration;Verbalized understanding       PT Short Term Goals - 11/09/17 1456      PT SHORT TERM GOAL #1   Title  The patient will demonstrate compliance with initial HEP and basic self care strategies to promote healing.      Status  Achieved      PT SHORT TERM GOAL #2   Title  The patient will report a 30% improvement in left LE pain with usual ADLs    Status  Achieved      PT SHORT TERM GOAL #3   Title  The patient will have improved left HS length to 70 degrees needed for dressing, usual ADLs and return to curling    Status  Achieved        PT Long Term Goals - 11/09/17 1408      PT LONG TERM GOAL #1   Title  The patient will be independent with safe  self progression of HEP     Status  Achieved      PT LONG TERM GOAL #2   Title  The patient will report a 60% reduction in left LE pain with usual ADLS    Baseline  01% better    Status  Achieved      PT LONG TERM GOAL #3   Title  The patient will have improved left LE strength to grossly 4+/5 needed to carry an object up and the steps safely     Status  Achieved      PT LONG TERM GOAL #4   Title  The patient will have improved left HS length to 75 degrees needed for return to curling     Status  Partially Met      PT LONG TERM GOAL #5   Title  FOTO functional outcome score improved from 48% to 37% indicating improved function with less pain     Status  Achieved            Plan - 11/09/17 1451    Clinical Impression Statement  The patient has progressed with hip and lumbar ROM as well and lumbo/pelvic/hip strength.  He reports no recent back or hip pain and minimal lower leg symptoms.  He rates his overall improvement at 70%.  He has been instructed in proper lifting technique but has modified his method with heavy lifting (lifting trailer on/off the hitch) and uses a pulley method.  He has been instructed in a comprehensive HEP for further strengthening on the left.  He continues to participate in ice curling 1x/week and we have discussed specific exercises to improve his ability to rise from the lunge position.  Significant improvement in FOTO functional outcome score.  He has met rehab goals and will discharge from PT at this time.  Patient will benefit from skilled therapeutic intervention in order to improve the following deficits and impairments:  Pain, Increased fascial restricitons, Decreased range of motion, Decreased strength  Visit Diagnosis: Sciatica, left side  Muscle weakness (generalized)    PHYSICAL THERAPY DISCHARGE SUMMARY  Visits from Start of Care: 9 Current functional level related to goals / functional outcomes: See clinical impressions above   Remaining deficits: As above   Education / Equipment: Comprehensive HEP Plan: Patient agrees to discharge.  Patient goals were met. Patient is being discharged due to meeting the stated rehab goals.  ?????          Problem List Patient Active Problem List   Diagnosis Date Noted  . Essential tremor 08/11/2015  . Male erectile dysfunction 07/16/2015  . Other abnormal glucose 07/16/2015  . Hyperlipidemia 07/16/2015  . Gastro-esophageal reflux disease without esophagitis 07/16/2015  . Essential (primary) hypertension 07/16/2015  . Malignant neoplasm of prostate ([place]) 07/16/2015  . Tremor 07/16/2015  . Vertigo 08/24/2014  . History of hypercholesterolemia 08/24/2014  . Sinus bradycardia 08/24/2014  . TIA (transient ischemic attack)   . Prostate cancer [facility])    [name], PT 11/09/17 3:00 PM Phone: [phone] Fax: [phone]  [name] 11/09/2017, 2:59 PM  [facility] [address] [place], [place], [address] Phone: [phone]   Fax:  [phone]  Name: SHARMARKE CICIO MRN: [id number] Date of Birth: [birth year]

## 2017-11-09 NOTE — Patient Instructions (Signed)
Access Code: [email or web address]  URL: [email or web address]  Date: 11/09/2017  Prepared by: [name]   Exercises  Seated Slump Nerve Glide - 10 reps - 1 sets - 2-3x daily - 7x weekly  Supine LE Neural Mobilization - 10 reps - 1 sets - 2x daily - 7x weekly  Hooklying Hamstring Stretch with Strap - 2-3 reps - 1 sets - 20 hold - 1x daily - 7x weekly  Supine Piriformis Stretch - 3 reps - 1 sets - 20 hold - 2x daily - 7x weekly  Supine Piriformis Stretch with Foot on Ground - 10 reps - 1 sets - 20 hold - 1x daily - 7x weekly  Supine Bridge - 10 reps - 1 sets - 1x daily - 3x weekly  Clamshell - 10 reps - 2 sets - 1x daily - 3x weekly  Supine Butterfly Groin Stretch - 3 reps - 20 hold - 2x daily - 7x weekly  Supine Hip Flexor Stretch with Weight - 3 reps - 20 hold - 2x daily - 7x weekly  Supine Active Straight Leg Raise - 5 reps - 2 sets - 2x daily - 7x weekly  Sit to Stand without Arm Support - 10 reps - 2 sets - 2x daily - 7x weekly  Seated Hamstring Stretch - 10 reps - 3 sets - 2x daily - 7x weekly  Seated Hip External Rotation Stretch - 3 reps - 1 sets - 1x daily - 7x weekly  Lunge with Counter Support - 5 reps - 1 sets - 1x daily - 7x weekly  Supine Hamstring Stretch with Strap - 3 reps - 1 sets - 30 hold - 1x daily - 7x weekly

## 2017-12-26 DIAGNOSIS — E785 Hyperlipidemia, unspecified: Secondary | ICD-10-CM | POA: Diagnosis not present

## 2017-12-26 DIAGNOSIS — E1140 Type 2 diabetes mellitus with diabetic neuropathy, unspecified: Secondary | ICD-10-CM | POA: Diagnosis not present

## 2017-12-26 DIAGNOSIS — R1310 Dysphagia, unspecified: Secondary | ICD-10-CM | POA: Diagnosis not present

## 2017-12-26 DIAGNOSIS — I10 Essential (primary) hypertension: Secondary | ICD-10-CM | POA: Diagnosis not present

## 2018-01-08 DIAGNOSIS — R1310 Dysphagia, unspecified: Secondary | ICD-10-CM | POA: Diagnosis not present

## 2018-01-15 DIAGNOSIS — K219 Gastro-esophageal reflux disease without esophagitis: Secondary | ICD-10-CM | POA: Diagnosis not present

## 2018-01-15 DIAGNOSIS — R1310 Dysphagia, unspecified: Secondary | ICD-10-CM | POA: Diagnosis not present

## 2018-01-15 DIAGNOSIS — R634 Abnormal weight loss: Secondary | ICD-10-CM | POA: Diagnosis not present

## 2018-01-24 DIAGNOSIS — R1310 Dysphagia, unspecified: Secondary | ICD-10-CM | POA: Diagnosis not present

## 2018-01-24 DIAGNOSIS — C61 Malignant neoplasm of prostate: Secondary | ICD-10-CM | POA: Diagnosis not present

## 2018-01-29 DIAGNOSIS — R972 Elevated prostate specific antigen [PSA]: Secondary | ICD-10-CM | POA: Diagnosis not present

## 2018-01-29 DIAGNOSIS — R3915 Urgency of urination: Secondary | ICD-10-CM | POA: Diagnosis not present

## 2018-01-29 DIAGNOSIS — N401 Enlarged prostate with lower urinary tract symptoms: Secondary | ICD-10-CM | POA: Diagnosis not present

## 2018-01-29 DIAGNOSIS — C61 Malignant neoplasm of prostate: Secondary | ICD-10-CM | POA: Diagnosis not present

## 2018-01-31 DIAGNOSIS — K259 Gastric ulcer, unspecified as acute or chronic, without hemorrhage or perforation: Secondary | ICD-10-CM | POA: Diagnosis not present

## 2018-01-31 DIAGNOSIS — R1310 Dysphagia, unspecified: Secondary | ICD-10-CM | POA: Diagnosis not present

## 2018-01-31 DIAGNOSIS — K2930 Chronic superficial gastritis without bleeding: Secondary | ICD-10-CM | POA: Diagnosis not present

## 2018-01-31 DIAGNOSIS — K317 Polyp of stomach and duodenum: Secondary | ICD-10-CM | POA: Diagnosis not present

## 2018-02-02 ENCOUNTER — Other Ambulatory Visit (HOSPITAL_COMMUNITY): Payer: Self-pay | Admitting: *Deleted

## 2018-02-02 DIAGNOSIS — R1310 Dysphagia, unspecified: Secondary | ICD-10-CM

## 2018-02-05 ENCOUNTER — Ambulatory Visit (HOSPITAL_COMMUNITY)
Admission: RE | Admit: 2018-02-05 | Discharge: 2018-02-05 | Disposition: A | Discharge status: Home / Self Care | Payer: Medicare Other | Source: Ambulatory Visit | Attending: Gastroenterology | Admitting: Gastroenterology

## 2018-02-05 DIAGNOSIS — R1314 Dysphagia, pharyngoesophageal phase: Secondary | ICD-10-CM | POA: Insufficient documentation

## 2018-02-05 DIAGNOSIS — K219 Gastro-esophageal reflux disease without esophagitis: Secondary | ICD-10-CM | POA: Diagnosis not present

## 2018-02-05 DIAGNOSIS — R1310 Dysphagia, unspecified: Secondary | ICD-10-CM | POA: Insufficient documentation

## 2018-02-05 NOTE — Therapy (Signed)
Modified Barium Swallow Progress Note  Patient Details  Name: ISAID SALVIA MRN: [id number] Date of Birth: [birth year]  Today's Date: 02/05/2018  Modified Barium Swallow completed.  Full report located under Chart Review in the Imaging Section.  Brief recommendations include the following:  Clinical Impression Pt presents with functional oral swallow, mild pharyngeal dysphagia, characterized as follows:   Pharyngeal stage of the swallow is characterized by reduced tongue base retraction, and cricopharyngeal dysfunction. This results in vallecular residue, lateral channel residue, and pyriform sinus residue. Dry swallows reduce residue, but not clear it completely. Liquid wash and chin tuck are ineffective in removing post-swallow residue.   Trace flash peneration was seen intermittently on thin and nectar thick liquids, however, this is within functional limits for pt age.   Pt is at significantly increased aspiration risk if safe swallow precautions are not followed - vallecular and pyriform sinus residue, and lateral channel residue may spill into the open airway if not cleared in a timely fashion. Additionally, pt has a history of moderate-severe esophageal dysmotility, which can further increase aspiration risk.    SLP reviewed results with pt/wife after this study, and provided written safe swallow strategies. Outpatient speech therapy is recommended for dysphagia treatment and to further educate pt/wife regarding safe swallow strategies.   Swallow Evaluation Recommendations  SLP Diet Recommendations: Dysphagia 3 (Mech soft) solids;Thin liquid   Liquid Administration via: Cup;Straw   Medication Administration: Whole meds with liquid   Supervision: Patient able to self feed   Compensations: Minimize environmental distractions;Slow rate;Small sips/bites   Postural Changes: Remain semi-upright after after feeds/meals (Comment);Seated upright at 90 degrees   Oral Care  Recommendations: Oral care QID     Martasia Talamante B. Quentin Ore, [facility], [place] Speech Language Pathologist [phone]  Shonna Chock 02/05/2018,2:22 PM

## 2018-02-06 DIAGNOSIS — K2930 Chronic superficial gastritis without bleeding: Secondary | ICD-10-CM | POA: Diagnosis not present

## 2018-02-06 DIAGNOSIS — K317 Polyp of stomach and duodenum: Secondary | ICD-10-CM | POA: Diagnosis not present

## 2018-02-08 ENCOUNTER — Ambulatory Visit: Payer: Medicare Other | Attending: Gastroenterology

## 2018-02-08 ENCOUNTER — Other Ambulatory Visit: Payer: Self-pay

## 2018-02-08 DIAGNOSIS — R1314 Dysphagia, pharyngoesophageal phase: Secondary | ICD-10-CM | POA: Diagnosis not present

## 2018-02-08 DIAGNOSIS — R1313 Dysphagia, pharyngeal phase: Secondary | ICD-10-CM | POA: Diagnosis not present

## 2018-02-08 NOTE — Patient Instructions (Addendum)
SWALLOWING EXERCISES Do these 6 of the 7 days per week until March 15th, then 2 times per week afterwards  1. Effortful Swallows - Press your tongue against the roof of your mouth for 3 seconds, then squeeze the muscles in your neck while you swallow your saliva or a sip of water - Repeat 15-20 times, 2-3 times a day, and use whenever you eat or drink  2. Masako Swallow - swallow with your tongue sticking out - Stick tongue out past your teeth and gently bite tongue with your teeth - Swallow, while holding your tongue with your teeth - Repeat 15-20 times, 2-3 times a day *use a wet spoon if your mouth gets dry*  3. Pitch Raise - Put your tongue out of your mouth  - Pull your tongue gently forward using the gauze, while pulling your tongue back into your mouth - 15 times  4. Shaker Exercise - head lift - Lie flat on your back in your bed or on a couch without pillows - Raise your head and look at your feet - KEEP YOUR SHOULDERS DOWN - HOLD FOR 45-60 SECONDS, then lower your head back down - Repeat 3 times, 2-3 times a day  5. Mendelsohn Maneuver - "half swallow" exercise - Start to swallow, and keep your Adam's apple up by squeezing hard with the muscles of the throat - Hold the squeeze for 5-7 seconds and then relax - Repeat 15 times, 2-3 times a day *use a wet spoon if your mouth gets dry*  6. Breath Hold - Say "HUH!" loudly, then hold your breath for 3 seconds at your voice box - Repeat 20 times, 2-3 times a day        7.  Open mouth swallow  - Open your mouth and swallow your saliva  - Repeat 10 times, 2-3 times a day   ==================================  WHEN YOU EAT: Small bites and sips Alternate between food and liquids Hard swallows Warm water prior to meals

## 2018-02-08 NOTE — Therapy (Signed)
[facility] [address] [place], [place], [address] Phone: [phone]   Fax:  [phone]  Speech Language Pathology Evaluation  Patient Details  Name: Travis Morris MRN: [id number] Date of Birth: 1940/10/05 Referring Provider (SLP): Wonda Horner, MD   Encounter Date: 02/08/2018  End of Session - 02/08/18 1609    Visit Number  1    Number of Visits  13    Date for SLP Re-Evaluation  05/08/18    SLP Start Time  1320    SLP Stop Time   1401    SLP Time Calculation (min)  41 min    Activity Tolerance  Patient tolerated treatment well       Past Medical History:  Diagnosis Date  . BPH with urinary obstruction   . GERD (gastroesophageal reflux disease)    ESOPHAGEAL REFLUX HX  . History of balanitis   . History of phimosis   . History of repair of rotator cuff   . Hx of biopsy    PENIS CUTANEOUS HX  . Hypercholesteremia   . Hypertension   . Macular hole    AIR FLUID EXCHANGE   . Organic impotence   . Prostate cancer [facility])     Past Surgical History:  Procedure Laterality Date  . APPENDECTOMY    . PARS PLANA VITRECTOMY W/ REPAIR OF MACULAR HOLE     x3  . ROTATOR CUFF REPAIR Right   . UVULOPLASTY     LASER ASSISTED    There were no vitals filed for this visit.  Subjective Assessment - 02/08/18 1337    Subjective  Pt arrives today after modified (MBS) on 02-05-18.    Patient is accompained by:  Family member   wife   Currently in Pain?  No/denies         SLP Evaluation [facility] - 02/08/18 1337      SLP Visit Information   SLP Received On  02/08/18    Referring Provider (SLP)  Wonda Horner, MD    Medical Diagnosis  phayrngoesophageal dysphagia      Subjective   Patient/Family Stated Goal  improve swallow function      General Information   HPI  Outpatient MBS due to concern for aspiration completed 02-05-18. See report for details. Pt arrives with pictures from his GI MD saying,  "Everything looked normal except for a polyp which he biopsied. He didn't see a tight part at the top (of pt's esophagus). He said there was nothing he could do."      Prior Functional Status   Cognitive/Linguistic Baseline  Within functional limits      Cognition   Overall Cognitive Status  Within Functional Limits for tasks assessed      Auditory Comprehension   Overall Auditory Comprehension  Appears within functional limits for tasks assessed      Verbal Expression   Overall Verbal Expression  Appears within functional limits for tasks assessed      Oral Motor/Sensory Function   Overall Oral Motor/Sensory Function  Appears within functional limits for tasks assessed      Motor Speech   Overall Motor Speech  WFL, except pt with difficulty with coordination of lingual movement to upper and lower lateral sulci, and with dissociating lingual movement to hard palate (pt's mandible moved simultaneously when pt attempted lingual elevation to hard palate).     Re: difficulty of coordination of lingual movement into lateral sulci, pt stated, "It feels like  my tongue is enlarged sometimes."  SLP discussed results of Monday's modified barium swallow (MBSS) with pt today. SLP also reviewed his swallow precautions, as well as provided rationale for each. Pt provided additional precautions not noted in write up for MBSS including alternating bites/sips and starting meals with warm/hot liquids.  Pt with some peculiar oral symptoms including intermittent numbness in the upper lip and rt eye watering simultaneously. Pt does not have reported neuro symptoms in his history, but has have ET and is being followed by Dr. Carles Collet for this. Pt stated he was inquiring with Dr. Carles Collet about these most recent symptoms. SLP educated pt/wife re: three of the HEP swallow exercises on "Pt instructions" (effortful, Masako, Shaker). Pt req'd min-mod A usually, faded to SBA. SLP to educate pt and wife with remaining HEP  exercises next session, as well as review precautions, using POs. (SLP unable to do so today due to time constraints).                SLP Education - 02/08/18 1608    Education Details  Procedure of HEP (Effortful, Masako, Shaker), swallow precautions    Person(s) Educated  Patient    Methods  Explanation;Handout;Demonstration;Verbal cues    Comprehension  Verbalized understanding;Verbal cues required;Returned demonstration;Need further instruction       SLP Short Term Goals - 02/08/18 1639      SLP SHORT TERM GOAL #1   Title  pt will follow swallow precautions with rare min A in 2 sessions    Time  4    Period  Weeks    Status  New      SLP SHORT TERM GOAL #2   Title  pt will perform swallowing HEP with rare min A in 2 sessions    Time  4    Period  Weeks    Status  New      SLP SHORT TERM GOAL #3   Title  pt will provide SLP 3 overt s/s aspiration PNA with modified independence in 3 sessions    Time  4    Period  Weeks    Status  New       SLP Long Term Goals - 02/08/18 1641      SLP LONG TERM GOAL #1   Title  pt will complete HEP for dysphagia with modified independence in 3 sessions    Time  8    Period  Weeks   or 13 sessions, for all LTGs   Status  New      SLP LONG TERM GOAL #2   Title  pt will follow swallow precautions with POs with modified independence over three sessions    Time  8    Period  Weeks    Status  New       Plan - 02/08/18 1610    Clinical Impression Statement  Pt presents with (reported from MBSS) functional swallow and a mild pharyngeal dysphagia with dysphagia III/thin liquids recommended. Minimizing distractions, alternating solids/liquids, and taking small bites and sips were suggested. SLP completing the swallow evaluation recommended swallowing therapy to ensure pt follows swallow precautions as well as to give pt opportunity to strengthen swallowing musculature.    Speech Therapy Frequency  2x / week    Duration  --    6 weeks or 13 sessions   Treatment/Interventions  Aspiration precaution training;Pharyngeal strengthening exercises;Diet toleration management by SLP;Group dysphagia treatment;Trials of upgraded texture/liquids;Compensatory strategies;Environmental controls;SLP instruction and feedback    Potential  to Achieve Goals  Good    SLP Home Exercise Plan  paritial program provided today    Consulted and Agree with Plan of Care  Patient       Patient will benefit from skilled therapeutic intervention in order to improve the following deficits and impairments:   Dysphagia, pharyngeal phase    Problem List Patient Active Problem List   Diagnosis Date Noted  . Essential tremor 08/11/2015  . Male erectile dysfunction 07/16/2015  . Other abnormal glucose 07/16/2015  . Hyperlipidemia 07/16/2015  . Gastro-esophageal reflux disease without esophagitis 07/16/2015  . Essential (primary) hypertension 07/16/2015  . Malignant neoplasm of prostate ([place]) 07/16/2015  . Tremor 07/16/2015  . Vertigo 08/24/2014  . History of hypercholesterolemia 08/24/2014  . Sinus bradycardia 08/24/2014  . TIA (transient ischemic attack)   . Prostate cancer [facility])     [place] ,[place], CCC-SLP  02/08/2018, 4:55 PM  [facility] [address] [place], [place], [address] Phone: [phone]   Fax:  [phone]  Name: Travis Morris MRN: [id number] Date of Birth: 1940/07/06

## 2018-02-13 ENCOUNTER — Ambulatory Visit: Payer: Medicare Other

## 2018-02-13 DIAGNOSIS — R1314 Dysphagia, pharyngoesophageal phase: Secondary | ICD-10-CM

## 2018-02-13 DIAGNOSIS — R1313 Dysphagia, pharyngeal phase: Secondary | ICD-10-CM | POA: Diagnosis not present

## 2018-02-13 NOTE — Therapy (Signed)
[facility] [address] [place], [place], [address] Phone: [phone]   Fax:  [phone]  Speech Language Pathology Treatment  Patient Details  Name: Travis Morris MRN: [id number] Date of Birth: Aug 29, 1940 Referring Provider (SLP): Wonda Horner, MD   Encounter Date: 02/13/2018  End of Session - 02/13/18 1528    Visit Number  2    Number of Visits  17    Date for SLP Re-Evaluation  05/08/18    SLP Start Time  0321    SLP Stop Time   1445    SLP Time Calculation (min)  42 min    Activity Tolerance  Patient tolerated treatment well       Past Medical History:  Diagnosis Date  . BPH with urinary obstruction   . GERD (gastroesophageal reflux disease)    ESOPHAGEAL REFLUX HX  . History of balanitis   . History of phimosis   . History of repair of rotator cuff   . Hx of biopsy    PENIS CUTANEOUS HX  . Hypercholesteremia   . Hypertension   . Macular hole    AIR FLUID EXCHANGE   . Organic impotence   . Prostate cancer [facility])     Past Surgical History:  Procedure Laterality Date  . APPENDECTOMY    . PARS PLANA VITRECTOMY W/ REPAIR OF MACULAR HOLE     x3  . ROTATOR CUFF REPAIR Right   . UVULOPLASTY     LASER ASSISTED    There were no vitals filed for this visit.         ADULT SLP TREATMENT - 02/13/18 1429      General Information   Behavior/Cognition  Alert;Cooperative;Pleasant mood;Requires cueing      Treatment Provided   Treatment provided  Dysphagia      Dysphagia Treatment   Temperature Spikes Noted  No    Respiratory Status  Room air    Oral Cavity - Dentition  Adequate natural dentition    Treatment Methods  Skilled observation;Therapeutic exercise;Compensation strategy training;Patient/caregiver education    Patient observed directly with PO's  Yes    Type of PO's observed  Thin liquids    Liquids provided via  Cup    Pharyngeal Phase Signs & Symptoms  Delayed cough   when  pt attempted Masako with sip liquid   Type of cueing  Visual;Tactile;Verbal    Amount of cueing  Moderate    Other treatment/comments  SLP worked with pt to educate him for HEP procedure. He req'd min A on effortufl and mod-max A on Masako. SLP eventually abandoned Masako due to pt frustration with inability to complete correctly. SLP taught pt Caryl Ada, gauze exercise (tongue base retraction), vocal adduction, and open mouth swallow with usual mod-max cues. Pt req'd tactile cues on SLP for [place] after max visual and verbal cues, afterwhich he completed with success 4/5. SLP suggested pt use wooden spoon handle for open mouth swallow.  SLP curious why pt could perform Masako during HEP education on eval day but could not perform today.      Pain Assessment   Pain Assessment  No/denies pain      Assessment / Recommendations / Plan   Plan  Continue with current plan of care      Progression Toward Goals   Progression toward goals  Progressing toward goals   heavier than expected cueing necessary for current exercises      SLP Education - 02/13/18 1528  Education Details  HEP procedure    Person(s) Educated  Patient    Methods  Explanation;Demonstration;Tactile cues;Verbal cues;Handout    Comprehension  Verbalized understanding;Verbal cues required;Tactile cues required;Returned demonstration;Need further instruction       SLP Short Term Goals - 02/13/18 1532      SLP SHORT TERM GOAL #1   Title  pt will follow swallow precautions with rare min A in 2 sessions    Time  4    Period  Weeks    Status  On-going      SLP SHORT TERM GOAL #2   Title  pt will perform swallowing HEP with rare min A in 2 sessions    Time  4    Period  Weeks    Status  On-going      SLP SHORT TERM GOAL #3   Title  pt will provide SLP 3 overt s/s aspiration PNA with modified independence in 3 sessions    Time  4    Period  Weeks    Status  On-going       SLP Long Term Goals - 02/13/18 1533       SLP LONG TERM GOAL #1   Title  pt will complete HEP for dysphagia with modified independence in 3 sessions    Time  8    Period  Weeks   or 17 sessions, for all LTGs   Status  On-going      SLP LONG TERM GOAL #2   Title  pt will follow swallow precautions with POs with modified independence over three sessions    Time  8    Period  Weeks    Status  On-going       Plan - 02/13/18 1528    Clinical Impression Statement  Pt presents with (reported from MBSS) functional swallow and a mild pharyngeal dysphagia with dysphagia III/thin liquids recommended. Minimizing distractions, alternating solids/liquids, and taking small bites and sips were suggested. SLP completing the swallow evaluation recommended swallowing therapy to ensure pt follows swallow precautions as well as to give pt opportunity to strengthen swallowing musculature. Pt would cont to benefit from swallowing therapy to reduce aspiration risk.    Speech Therapy Frequency  2x / week    Duration  --   6 weeks or 13 sessions   Treatment/Interventions  Aspiration precaution training;Pharyngeal strengthening exercises;Diet toleration management by SLP;Group dysphagia treatment;Trials of upgraded texture/liquids;Compensatory strategies;Environmental controls;SLP instruction and feedback    Potential to [place]  paritial program provided today    Consulted and Agree with Plan of Care  Patient       Patient will benefit from skilled therapeutic intervention in order to improve the following deficits and impairments:   Pharyngoesophageal dysphagia    Problem List Patient Active Problem List   Diagnosis Date Noted  . Essential tremor 08/11/2015  . Male erectile dysfunction 07/16/2015  . Other abnormal glucose 07/16/2015  . Hyperlipidemia 07/16/2015  . Gastro-esophageal reflux disease without esophagitis 07/16/2015  . Essential (primary) hypertension 07/16/2015  . Malignant neoplasm of  prostate ([place]) 07/16/2015  . Tremor 07/16/2015  . Vertigo 08/24/2014  . History of hypercholesterolemia 08/24/2014  . Sinus bradycardia 08/24/2014  . TIA (transient ischemic attack)   . Prostate cancer ([place])     [place] ,[place], [place]  02/13/2018, 3:33 PM  [facility] [address] [place], [place], [address] Phone: [phone]  Fax:  [phone]   Name: TAYTEN BERGDOLL MRN: [id number] Date of Birth: 1940/04/08

## 2018-02-13 NOTE — Patient Instructions (Addendum)
SWALLOWING EXERCISES Do these 6 of the 7 days per week until March 15th, then 2 times per week afterwards  1. Effortful Swallows - Press your tongue against the roof of your mouth for 3 seconds, then squeeze the muscles in your neck while you swallow your saliva or a sip of water - Repeat 15-20 times, 2-3 times a day, and use whenever you eat or drink  2. XX-OMITTED-XX Masako Swallow - swallow with your tongue sticking out   - Stick tongue out past your teeth and gently bite tongue with your teeth - Swallow, while holding your tongue with your teeth - Repeat 15-20 times, 2-3 times a day *use a wet spoon if your mouth gets dry*  3. Gauze pull - Put your tongue straight out of your mouth  - Hold your tongue stationary with the gauze while trying to pull your tongue back into your mouth - 15 times   5. Mendelsohn Maneuver - "half swallow" exercise - Start to swallow, and keep your Adam's apple up by squeezing hard with the muscles of the throat - Hold the squeeze for 5-7 seconds and then relax - Repeat 15 times, 2-3 times a day *use a wet spoon if your mouth gets dry*  6. Breath Hold - Say "HUH!" loudly, then hold your breath for 3 seconds at your voice box - Repeat 20 times, 2-3 times a day        7.  Open mouth swallow           - Open your mouth and swallow your saliva           - Repeat 10 times, 2-3 times a day   ==================================  WHEN YOU EAT: Small bites and sips Alternate between food and liquids Hard swallows Warm water prior to meals

## 2018-02-14 DIAGNOSIS — R1310 Dysphagia, unspecified: Secondary | ICD-10-CM | POA: Diagnosis not present

## 2018-02-14 DIAGNOSIS — M25512 Pain in left shoulder: Secondary | ICD-10-CM | POA: Diagnosis not present

## 2018-02-14 DIAGNOSIS — M5030 Other cervical disc degeneration, unspecified cervical region: Secondary | ICD-10-CM | POA: Diagnosis not present

## 2018-02-16 ENCOUNTER — Ambulatory Visit: Payer: Medicare Other

## 2018-02-16 DIAGNOSIS — R1314 Dysphagia, pharyngoesophageal phase: Secondary | ICD-10-CM

## 2018-02-16 DIAGNOSIS — R1313 Dysphagia, pharyngeal phase: Secondary | ICD-10-CM | POA: Diagnosis not present

## 2018-02-16 NOTE — Therapy (Signed)
[facility] [address] [place], [place], [address] Phone: [phone]   Fax:  [phone]  Speech Language Pathology Treatment  Patient Details  Name: Travis Morris MRN: [id number] Date of Birth: Aug 25, 1940 Referring Provider (SLP): Wonda Horner, MD   Encounter Date: 02/16/2018  End of Session - 02/16/18 1607    Visit Number  3    Date for SLP Re-Evaluation  05/08/18    SLP Start Time  0933    SLP Stop Time   8466    SLP Time Calculation (min)  42 min    Activity Tolerance  Patient tolerated treatment well       Past Medical History:  Diagnosis Date  . BPH with urinary obstruction   . GERD (gastroesophageal reflux disease)    ESOPHAGEAL REFLUX HX  . History of balanitis   . History of phimosis   . History of repair of rotator cuff   . Hx of biopsy    PENIS CUTANEOUS HX  . Hypercholesteremia   . Hypertension   . Macular hole    AIR FLUID EXCHANGE   . Organic impotence   . Prostate cancer [facility])     Past Surgical History:  Procedure Laterality Date  . APPENDECTOMY    . PARS PLANA VITRECTOMY W/ REPAIR OF MACULAR HOLE     x3  . ROTATOR CUFF REPAIR Right   . UVULOPLASTY     LASER ASSISTED    There were no vitals filed for this visit.  Subjective Assessment - 02/16/18 0956    Subjective  Pt arives today expressing question about whether or not he is swallowing water or air.            ADULT SLP TREATMENT - 02/16/18 1603      General Information   Behavior/Cognition  Alert;Cooperative;Pleasant mood;Requires cueing      Treatment Provided   Treatment provided  Dysphagia      Dysphagia Treatment   Temperature Spikes Noted  No    Respiratory Status  Room air    Oral Cavity - Dentition  Adequate natural dentition    Treatment Methods  Skilled observation;Therapeutic exercise;Patient/caregiver education    Patient observed directly with PO's  Yes    Type of PO's observed  Thin liquids     Liquids provided via  Cup    Pharyngeal Phase Signs & Symptoms  --   none   Type of cueing  Verbal;Visual   demonstration   Amount of cueing  Moderate    Other treatment/comments  HEP accuracy was targeted today - pt spent most of the session with inquiry about Mendelsohn. SLP used visual, verbal, and demonstration cues and pt became more successful as session progressed, however was still not 100% successful. OTher HEP exercises req'd min A occasionally. SLP will cont to need to work on [place] with pt.      Pain Assessment   Pain Assessment  No/denies pain      Assessment / Recommendations / Plan   Plan  Continue with current plan of care      Progression Toward Goals   Progression toward goals  Progressing toward goals       SLP Education - 02/16/18 1606    Education Details  HEP procedure - focused mostly on [employer]) Educated  Patient    Methods  Explanation;Demonstration;Verbal cues    Comprehension  Verbalized understanding;Need further instruction;Returned demonstration;Verbal cues required  SLP Short Term Goals - 02/16/18 1609      SLP SHORT TERM GOAL #1   Title  pt will follow swallow precautions with rare min A in 2 sessions    Time  4    Period  Weeks    Status  On-going      SLP SHORT TERM GOAL #2   Title  pt will perform swallowing HEP with rare min A in 2 sessions    Time  4    Period  Weeks    Status  On-going      SLP SHORT TERM GOAL #3   Title  pt will provide SLP 3 overt s/s aspiration PNA with modified independence in 3 sessions    Time  4    Period  Weeks    Status  On-going       SLP Long Term Goals - 02/16/18 1609      SLP LONG TERM GOAL #1   Title  pt will complete HEP for dysphagia with modified independence in 3 sessions    Time  8    Period  Weeks   or 17 sessions, for all LTGs   Status  On-going      SLP LONG TERM GOAL #2   Title  pt will follow swallow precautions with POs with modified independence over  three sessions    Time  8    Period  Weeks    Status  On-going       Plan - 02/16/18 1607    Clinical Impression Statement  Pt presents with (reported from MBSS) functional swallow and a mild pharyngeal dysphagia with dysphagia III/thin liquids recommended. Minimizing distractions, alternating solids/liquids, and taking small bites and sips were suggested. Pt also reported evaluating SLP told him to drink warm liquids prior to meals.  SLP completing the swallow evaluation also recommended swallowing therapy to ensure pt follows swallow precautions as well as to give pt opportunity to strengthen swallowing musculature. Therapy today focused mainly on Mendelsohn but SLP also observed and evaluated pt's success with all HEP. Pt cont to require cues for procedural errors. Pt would cont to benefit from swallowing therapy to reduce aspiration risk.    Speech Therapy Frequency  2x / week    Duration  --   6 weeks or 13 sessions   Treatment/Interventions  Aspiration precaution training;Pharyngeal strengthening exercises;Diet toleration management by SLP;Group dysphagia treatment;Trials of upgraded texture/liquids;Compensatory strategies;Environmental controls;SLP instruction and feedback    Potential to [place]  paritial program provided today    Consulted and Agree with Plan of Care  Patient       Patient will benefit from skilled therapeutic intervention in order to improve the following deficits and impairments:   Pharyngoesophageal dysphagia    Problem List Patient Active Problem List   Diagnosis Date Noted  . Essential tremor 08/11/2015  . Male erectile dysfunction 07/16/2015  . Other abnormal glucose 07/16/2015  . Hyperlipidemia 07/16/2015  . Gastro-esophageal reflux disease without esophagitis 07/16/2015  . Essential (primary) hypertension 07/16/2015  . Malignant neoplasm of prostate ([place]) 07/16/2015  . Tremor 07/16/2015  . Vertigo 08/24/2014   . History of hypercholesterolemia 08/24/2014  . Sinus bradycardia 08/24/2014  . TIA (transient ischemic attack)   . Prostate cancer ([place])     [place] ,[place], [place]  02/16/2018, 4:09 PM  [facility] [address] [place], [place], [address] Phone: [phone]  Fax:  [phone]   Name: Travis Morris MRN: [id number] Date of Birth: 02/11/1940

## 2018-02-17 DIAGNOSIS — G7000 Myasthenia gravis without (acute) exacerbation: Secondary | ICD-10-CM

## 2018-02-17 HISTORY — DX: Myasthenia gravis without (acute) exacerbation: G70.00

## 2018-02-20 ENCOUNTER — Ambulatory Visit: Payer: Medicare Other | Attending: Gastroenterology | Admitting: Speech Pathology

## 2018-02-20 ENCOUNTER — Encounter: Payer: Self-pay | Admitting: Speech Pathology

## 2018-02-20 DIAGNOSIS — R1313 Dysphagia, pharyngeal phase: Secondary | ICD-10-CM | POA: Diagnosis not present

## 2018-02-20 DIAGNOSIS — R1314 Dysphagia, pharyngoesophageal phase: Secondary | ICD-10-CM | POA: Diagnosis not present

## 2018-02-20 NOTE — Patient Instructions (Signed)
   Fork down in between bites  You can do some of these in the car, during commercials, in bed  Shoot for more than 1 time a day   Practice open mouth swallow  Warm water with meals

## 2018-02-20 NOTE — Therapy (Signed)
[facility] [address] [place], [place], [address] Phone: [phone]   Fax:  [phone]  Speech Language Pathology Treatment  Patient Details  Name: Travis Morris MRN: [id number] Date of Birth: Nov 24, 1940 Referring Provider (SLP): Wonda Horner, MD   Encounter Date: 02/20/2018  End of Session - 02/20/18 1126    Visit Number  4    Number of Visits  17    Date for SLP Re-Evaluation  05/08/18    SLP Start Time  0933    SLP Stop Time   1010    SLP Time Calculation (min)  37 min    Activity Tolerance  Patient tolerated treatment well       Past Medical History:  Diagnosis Date  . BPH with urinary obstruction   . GERD (gastroesophageal reflux disease)    ESOPHAGEAL REFLUX HX  . History of balanitis   . History of phimosis   . History of repair of rotator cuff   . Hx of biopsy    PENIS CUTANEOUS HX  . Hypercholesteremia   . Hypertension   . Macular hole    AIR FLUID EXCHANGE   . Organic impotence   . Prostate cancer [facility])     Past Surgical History:  Procedure Laterality Date  . APPENDECTOMY    . PARS PLANA VITRECTOMY W/ REPAIR OF MACULAR HOLE     x3  . ROTATOR CUFF REPAIR Right   . UVULOPLASTY     LASER ASSISTED    There were no vitals filed for this visit.         ADULT SLP TREATMENT - 02/20/18 0955      General Information   Behavior/Cognition  Alert;Cooperative;Pleasant mood;Requires cueing      Treatment Provided   Treatment provided  Dysphagia      Dysphagia Treatment   Temperature Spikes Noted  No    Respiratory Status  Room air    Oral Cavity - Dentition  Adequate natural dentition    Treatment Methods  Skilled observation;Therapeutic exercise;Patient/caregiver education    Patient observed directly with PO's  Yes    Type of PO's observed  Thin liquids    Feeding  Able to feed self    Liquids provided via  Cup    Amount of cueing  Minimal    Other treatment/comments  Pt  initially required usual mod verbal cues, demonstrations and tactile feed back for Mendelsoh, imroved to occasional min A. Laryngeal breath hold initially required modeling, verbal explanation and rationale, however pt improved to rare min A. Remianing exercises accurate with rare min A after initial demonstration/explanation. Pt and spouse report difficulty recalling dry (2nd) swallow. They do watch TV during meals at the kitchen table. Pt instructed to put his utensil down in between bites to focus on dry swallow and subsequent liquid sip. Provided tent sign for table as visual reminder. Spouse does report reduced coughing episodes with meals, however they do persist.       Pain Assessment   Pain Assessment  No/denies pain      Assessment / Recommendations / Plan   Plan  Continue with current plan of care      Dysphagia Recommendations   Diet recommendations  Dysphagia 3 (mechanical soft)    Liquids provided via  Cup;Straw    Medication Administration  Whole meds with liquid    Supervision  Patient able to self feed    Compensations  Effortful swallow;Multiple dry swallows after each  bite/sip;Small sips/bites;Slow rate;Follow solids with liquid    Postural Changes and/or Swallow Maneuvers  Seated upright 90 degrees      Progression Toward Goals   Progression toward goals  Progressing toward goals       SLP Education - 02/20/18 1122    Education Details  visual reminder for compensations; reduce distractions with meals, especially if spouse is not home, start with warm liquid    Person(s) Educated  Patient    Methods  Explanation;Demonstration;Verbal cues;Handout    Comprehension  Verbalized understanding;Returned demonstration;Need further instruction;Verbal cues required;Tactile cues required       SLP Short Term Goals - 02/20/18 1125      SLP SHORT TERM GOAL #1   Title  pt will follow swallow precautions with rare min A in 2 sessions    Time  3    Period  Weeks    Status   On-going      SLP SHORT TERM GOAL #2   Title  pt will perform swallowing HEP with rare min A in 2 sessions    Time  3    Period  Weeks    Status  On-going      SLP SHORT TERM GOAL #3   Title  pt will provide SLP 3 overt s/s aspiration PNA with modified independence in 3 sessions    Time  3    Period  Weeks    Status  On-going       SLP Long Term Goals - 02/20/18 1126      SLP LONG TERM GOAL #1   Title  pt will complete HEP for dysphagia with modified independence in 3 sessions    Time  7    Period  Weeks   or 17 sessions, for all LTGs   Status  On-going      SLP LONG TERM GOAL #2   Title  pt will follow swallow precautions with POs with modified independence over three sessions    Time  7    Period  Weeks    Status  On-going       Plan - 02/20/18 1123    Clinical Impression Statement  Pt continues to require initial demonstration and verbal instructions initially for HEP. Inconsistent f/u with warm liquids and dry swallows. Visual tent reminder for his table at home provided. Continue skilled ST to maximize carryover of HEP accurately and carryover of compensatory straegies to reduce aspiration risk.     Speech Therapy Frequency  2x / week    Duration  --   6 weeks or 13 sessions   Treatment/Interventions  Aspiration precaution training;Pharyngeal strengthening exercises;Diet toleration management by SLP;Group dysphagia treatment;Trials of upgraded texture/liquids;Compensatory strategies;Environmental controls;SLP instruction and feedback    Potential to Achieve Goals  Good       Patient will benefit from skilled therapeutic intervention in order to improve the following deficits and impairments:   Dysphagia, pharyngeal phase  Pharyngoesophageal dysphagia    Problem List Patient Active Problem List   Diagnosis Date Noted  . Essential tremor 08/11/2015  . Male erectile dysfunction 07/16/2015  . Other abnormal glucose 07/16/2015  . Hyperlipidemia 07/16/2015  .  Gastro-esophageal reflux disease without esophagitis 07/16/2015  . Essential (primary) hypertension 07/16/2015  . Malignant neoplasm of prostate ([place]) 07/16/2015  . Tremor 07/16/2015  . Vertigo 08/24/2014  . History of hypercholesterolemia 08/24/2014  . Sinus bradycardia 08/24/2014  . TIA (transient ischemic attack)   . Prostate cancer ([place])  Kunaal Walkins, Annye Rusk MS, [place] 02/20/2018, 11:27 AM  [facility] [address] [place], [place], [address] Phone: [phone]   Fax:  [phone]   Name: Travis Morris MRN: [id number] Date of Birth: 1940-05-11

## 2018-02-23 ENCOUNTER — Ambulatory Visit: Payer: Medicare Other

## 2018-02-23 DIAGNOSIS — R1313 Dysphagia, pharyngeal phase: Secondary | ICD-10-CM | POA: Diagnosis not present

## 2018-02-23 DIAGNOSIS — R1314 Dysphagia, pharyngoesophageal phase: Secondary | ICD-10-CM

## 2018-02-23 NOTE — Therapy (Signed)
[facility] [address] [place], [place], [address] Phone: [phone]   Fax:  [phone]  Speech Language Pathology Treatment  Patient Details  Name: Travis Morris MRN: [id number] Date of Birth: [birth year] Referring Provider (SLP): Wonda Horner, MD   Encounter Date: 02/23/2018  End of Session - 02/23/18 1540    Visit Number  5    Number of Visits  17    Date for SLP Re-Evaluation  05/08/18    SLP Start Time  1447    SLP Stop Time   1527    SLP Time Calculation (min)  40 min    Activity Tolerance  Patient tolerated treatment well       Past Medical History:  Diagnosis Date  . BPH with urinary obstruction   . GERD (gastroesophageal reflux disease)    ESOPHAGEAL REFLUX HX  . History of balanitis   . History of phimosis   . History of repair of rotator cuff   . Hx of biopsy    PENIS CUTANEOUS HX  . Hypercholesteremia   . Hypertension   . Macular hole    AIR FLUID EXCHANGE   . Organic impotence   . Prostate cancer [facility])     Past Surgical History:  Procedure Laterality Date  . APPENDECTOMY    . PARS PLANA VITRECTOMY W/ REPAIR OF MACULAR HOLE     x3  . ROTATOR CUFF REPAIR Right   . UVULOPLASTY     LASER ASSISTED    There were no vitals filed for this visit.  Subjective Assessment - 02/23/18 1502    Subjective  Pt reports that he is remembering 2nd swallow more frequently now, with table tent cues.    Currently in Pain?  No/denies            ADULT SLP TREATMENT - 02/23/18 1504      General Information   Behavior/Cognition  Alert;Cooperative;Pleasant mood;Requires cueing      Treatment Provided   Treatment provided  Dysphagia      Dysphagia Treatment   Temperature Spikes Noted  No    Respiratory Status  Room air    Oral Cavity - Dentition  Adequate natural dentition    Treatment Methods  Skilled observation;Therapeutic exercise;Patient/caregiver education    Patient observed  directly with PO's  Yes    Type of PO's observed  Thin liquids    Feeding  Able to feed self    Liquids provided via  Cup    Amount of cueing  Minimal    Other treatment/comments  Initially, pt req'd occasional min-mod cues for [facility] but pt was independent at end. He req'd min-mod A usually with the remainder of exercises, faded to min A occasionally. Pt req'd usual cues to keep mouth open wiht open mouth swallow. With cereal bar and water, pt req'd initial SLP cues for swallowing using double swallow, and taking smaller sips. Pt alternated appropriately. Pt educated today with what to do when he hears a "wet" voice, due to exhibiting x2 during session.       Dysphagia Recommendations   Diet recommendations  Dysphagia 3 (mechanical soft);Thin liquid    Liquids provided via  Straw;Cup    Medication Administration  Whole meds with liquid    Supervision  Patient able to self feed    Compensations  Effortful swallow;Multiple dry swallows after each bite/sip;Small sips/bites;Slow rate;Follow solids with liquid    Postural Changes and/or Swallow Maneuvers  Seated upright 90  degrees      Progression Toward Goals   Progression toward goals  Progressing toward goals       SLP Education - 02/23/18 1540    Education Details  "wet" voice means pt needs to clear throat and reswallow    Person(s) Educated  Patient    Methods  Explanation;Demonstration    Comprehension  Verbalized understanding;Returned demonstration;Need further instruction       SLP Short Term Goals - 02/23/18 1548      SLP SHORT TERM GOAL #1   Title  pt will follow swallow precautions with rare min A in 2 sessions    Time  3    Period  Weeks    Status  On-going      SLP SHORT TERM GOAL #2   Title  pt will perform swallowing HEP with rare min A in 2 sessions    Time  3    Period  Weeks    Status  On-going      SLP SHORT TERM GOAL #3   Title  pt will provide SLP 3 overt s/s aspiration PNA with modified independence  in 3 sessions    Time  3    Period  Weeks    Status  On-going       SLP Long Term Goals - 02/23/18 1548      SLP LONG TERM GOAL #1   Title  pt will complete HEP for dysphagia with modified independence in 3 sessions    Time  7    Period  Weeks   or 17 sessions, for all LTGs   Status  On-going      SLP LONG TERM GOAL #2   Title  pt will follow swallow precautions with POs with modified independence over three sessions    Time  7    Period  Weeks    Status  On-going       Plan - 02/23/18 1541    Clinical Impression Statement  Pt continues to require initial demonstration and verbal instructions initially for all HEP exercises, pt was independent with some by session end. He will need cont'd assistance with procedure. SLP ?s neurological basis for dysphagia, given pt's difficulty with HEP procedure. Pt scheduled to see Dr. Carles Collet in April. Pt reports visual tent reminder for his table at home has incr'd his use of double swallow resulting in less coughing during meals. Continue skilled ST to maximize carryover of HEP accurately and carryover of compensatory straegies to reduce aspiration risk.     Speech Therapy Frequency  2x / week    Duration  --   6 weeks or 13 sessions   Treatment/Interventions  Aspiration precaution training;Pharyngeal strengthening exercises;Diet toleration management by SLP;Group dysphagia treatment;Trials of upgraded texture/liquids;Compensatory strategies;Environmental controls;SLP instruction and feedback    Potential to Achieve Goals  Good       Patient will benefit from skilled therapeutic intervention in order to improve the following deficits and impairments:   Pharyngoesophageal dysphagia    Problem List Patient Active Problem List   Diagnosis Date Noted  . Essential tremor 08/11/2015  . Male erectile dysfunction 07/16/2015  . Other abnormal glucose 07/16/2015  . Hyperlipidemia 07/16/2015  . Gastro-esophageal reflux disease without esophagitis  07/16/2015  . Essential (primary) hypertension 07/16/2015  . Malignant neoplasm of prostate ([place]) 07/16/2015  . Tremor 07/16/2015  . Vertigo 08/24/2014  . History of hypercholesterolemia 08/24/2014  . Sinus bradycardia 08/24/2014  . TIA (transient ischemic attack)   .  Prostate cancer [facility])     [place] ,[place], [place]  02/23/2018, 3:49 PM  [facility] [address] [place], [place], [address] Phone: [phone]   Fax:  [phone]   Name: Travis Morris MRN: [id number] Date of Birth: [birth year]

## 2018-02-27 ENCOUNTER — Ambulatory Visit: Payer: Medicare Other | Admitting: Speech Pathology

## 2018-02-27 ENCOUNTER — Other Ambulatory Visit: Payer: Self-pay

## 2018-02-27 ENCOUNTER — Encounter: Payer: Self-pay | Admitting: Speech Pathology

## 2018-02-27 ENCOUNTER — Encounter (HOSPITAL_COMMUNITY): Payer: Self-pay | Admitting: *Deleted

## 2018-02-27 DIAGNOSIS — G250 Essential tremor: Secondary | ICD-10-CM | POA: Diagnosis present

## 2018-02-27 DIAGNOSIS — Z8546 Personal history of malignant neoplasm of prostate: Secondary | ICD-10-CM

## 2018-02-27 DIAGNOSIS — E785 Hyperlipidemia, unspecified: Secondary | ICD-10-CM | POA: Diagnosis present

## 2018-02-27 DIAGNOSIS — Z8249 Family history of ischemic heart disease and other diseases of the circulatory system: Secondary | ICD-10-CM

## 2018-02-27 DIAGNOSIS — R471 Dysarthria and anarthria: Secondary | ICD-10-CM | POA: Diagnosis present

## 2018-02-27 DIAGNOSIS — N401 Enlarged prostate with lower urinary tract symptoms: Secondary | ICD-10-CM | POA: Diagnosis present

## 2018-02-27 DIAGNOSIS — E7800 Pure hypercholesterolemia, unspecified: Secondary | ICD-10-CM | POA: Diagnosis present

## 2018-02-27 DIAGNOSIS — K219 Gastro-esophageal reflux disease without esophagitis: Secondary | ICD-10-CM | POA: Diagnosis not present

## 2018-02-27 DIAGNOSIS — I10 Essential (primary) hypertension: Secondary | ICD-10-CM | POA: Diagnosis present

## 2018-02-27 DIAGNOSIS — N3281 Overactive bladder: Secondary | ICD-10-CM | POA: Diagnosis present

## 2018-02-27 DIAGNOSIS — H5330 Unspecified disorder of binocular vision: Secondary | ICD-10-CM | POA: Diagnosis not present

## 2018-02-27 DIAGNOSIS — H532 Diplopia: Secondary | ICD-10-CM | POA: Diagnosis not present

## 2018-02-27 DIAGNOSIS — R1314 Dysphagia, pharyngoesophageal phase: Secondary | ICD-10-CM

## 2018-02-27 DIAGNOSIS — E876 Hypokalemia: Secondary | ICD-10-CM | POA: Diagnosis present

## 2018-02-27 DIAGNOSIS — R490 Dysphonia: Secondary | ICD-10-CM | POA: Diagnosis present

## 2018-02-27 DIAGNOSIS — G7000 Myasthenia gravis without (acute) exacerbation: Principal | ICD-10-CM | POA: Diagnosis present

## 2018-02-27 DIAGNOSIS — G629 Polyneuropathy, unspecified: Secondary | ICD-10-CM | POA: Diagnosis present

## 2018-02-27 DIAGNOSIS — Z66 Do not resuscitate: Secondary | ICD-10-CM | POA: Diagnosis present

## 2018-02-27 DIAGNOSIS — H3530 Unspecified macular degeneration: Secondary | ICD-10-CM | POA: Diagnosis present

## 2018-02-27 DIAGNOSIS — R1310 Dysphagia, unspecified: Secondary | ICD-10-CM | POA: Diagnosis present

## 2018-02-27 DIAGNOSIS — Z800 Family history of malignant neoplasm of digestive organs: Secondary | ICD-10-CM

## 2018-02-27 DIAGNOSIS — Z7982 Long term (current) use of aspirin: Secondary | ICD-10-CM

## 2018-02-27 DIAGNOSIS — H518 Other specified disorders of binocular movement: Secondary | ICD-10-CM | POA: Diagnosis present

## 2018-02-27 NOTE — ED Triage Notes (Signed)
Pt reports diplopia today.  Denies any injury, h/a, or eye pain.  He is A&Ox 4, in NAD.

## 2018-02-27 NOTE — Patient Instructions (Signed)
  Signs of Aspiration Pneumonia   . Chest pain/tightness . Fever (can be low grade) . Cough  o With foul-smelling phlegm (sputum) o With sputum containing pus or blood o With greenish sputum . Fatigue  . Shortness of breath  . Wheezing   **IF YOU HAVE THESE SIGNS, CONTACT YOUR DOCTOR OR GO TO THE EMERGENCY DEPARTMENT OR URGENT CARE AS SOON AS POSSIBLE**   Continue to practice good, thorough oral care to keep bacteria minimal in your mouth

## 2018-02-27 NOTE — Therapy (Signed)
[facility] [address] [place], [place], [address] Phone: [phone]   Fax:  [phone]  Speech Language Pathology Treatment  Patient Details  Name: Travis Morris MRN: [id number] Date of Birth: May 29, 1940 Referring Provider (SLP): Wonda Horner, MD   Encounter Date: 02/27/2018  End of Session - 02/27/18 1003    Visit Number  6    Number of Visits  17    Date for SLP Re-Evaluation  05/08/18    SLP Start Time  0929    SLP Stop Time   1004    SLP Time Calculation (min)  35 min    Activity Tolerance  Patient tolerated treatment well       Past Medical History:  Diagnosis Date  . BPH with urinary obstruction   . GERD (gastroesophageal reflux disease)    ESOPHAGEAL REFLUX HX  . History of balanitis   . History of phimosis   . History of repair of rotator cuff   . Hx of biopsy    PENIS CUTANEOUS HX  . Hypercholesteremia   . Hypertension   . Macular hole    AIR FLUID EXCHANGE   . Organic impotence   . Prostate cancer [facility])     Past Surgical History:  Procedure Laterality Date  . APPENDECTOMY    . PARS PLANA VITRECTOMY W/ REPAIR OF MACULAR HOLE     x3  . ROTATOR CUFF REPAIR Right   . UVULOPLASTY     LASER ASSISTED    There were no vitals filed for this visit.  Subjective Assessment - 02/27/18 0934    Subjective  "I have a louder voice than I did with Glendell Docker last Friday"    Currently in Pain?  No/denies            ADULT SLP TREATMENT - 02/27/18 0934      General Information   Behavior/Cognition  Alert;Cooperative;Pleasant mood;Requires cueing      Treatment Provided   Treatment provided  Dysphagia      Dysphagia Treatment   Temperature Spikes Noted  No    Respiratory Status  Room air    Oral Cavity - Dentition  Adequate natural dentition    Treatment Methods  Skilled observation;Therapeutic exercise;Patient/caregiver education    Patient observed directly with PO's  Yes    Type of  PO's observed  Thin liquids    Feeding  Able to feed self    Liquids provided via  Cup    Amount of cueing  Minimal   to supervision cues   Other treatment/comments  Pt required 1 verbal cue and modeling for laryngeal breath hold, otherwise supervision cues to mod I for other exercises. Pt reports that he using double swallows 80% of the time at home when given choices of percentage of how often he is doing this. He states his coughing with meals has decreased to 1x every few days. He reports continuing Dysphagia 3 diet. He conitnues to loose weight. Educated pt re: to choose higher protien foods over breads. He is taking Boost also.      Dysphagia Recommendations   Diet recommendations  Dysphagia 3 (mechanical soft)    Liquids provided via  Straw;Cup    Medication Administration  Whole meds with liquid    Supervision  Patient able to self feed    Compensations  Effortful swallow;Multiple dry swallows after each bite/sip;Small sips/bites;Slow rate;Follow solids with liquid    Postural Changes and/or Swallow Maneuvers  Seated upright  90 degrees      Progression Toward Goals   Progression toward goals  Progressing toward goals       SLP Education - 02/27/18 0957    Education Details  s/s of aspiration pna; wet voice - clear your throat and swallow    Person(s) Educated  Patient    Methods  Explanation;Demonstration    Comprehension  Verbalized understanding       SLP Short Term Goals - 02/27/18 1002      SLP SHORT TERM GOAL #1   Title  pt will follow swallow precautions with rare min A in 2 sessions    Time  2    Period  Weeks    Status  Achieved      SLP SHORT TERM GOAL #2   Title  pt will perform swallowing HEP with rare min A in 2 sessions    Time  2    Period  Weeks    Status  On-going      SLP SHORT TERM GOAL #3   Title  pt will provide SLP 3 overt s/s aspiration PNA with modified independence in 3 sessions    Time  2    Period  Weeks    Status  On-going       SLP  Long Term Goals - 02/27/18 1003      SLP LONG TERM GOAL #1   Title  pt will complete HEP for dysphagia with modified independence in 3 sessions    Time  6    Period  Weeks   or 17 sessions, for all LTGs   Status  On-going      SLP LONG TERM GOAL #2   Title  pt will follow swallow precautions with POs with modified independence over three sessions    Time  6    Period  Weeks    Status  On-going       Plan - 02/27/18 1000    Clinical Impression Statement  Pt continues to require initial demonstration and verbal instructions initially for all HEP exercises, pt was independent with some by session end. He will need cont'd assistance with procedure. SLP ?s neurological basis for dysphagia, given pt's difficulty with HEP procedure. Pt scheduled to see Dr. Carles Collet in April. Pt reports visual tent reminder for his table at home has incr'd his use of double swallow resulting in less coughing during meals. Continue skilled ST to maximize carryover of HEP accurately and carryover of compensatory straegies to reduce aspiration risk.     Speech Therapy Frequency  2x / week    Duration  --   6 weeks or 13 sessions   Treatment/Interventions  Aspiration precaution training;Pharyngeal strengthening exercises;Diet toleration management by SLP;Group dysphagia treatment;Trials of upgraded texture/liquids;Compensatory strategies;Environmental controls;SLP instruction and feedback    Potential to Achieve Goals  Good       Patient will benefit from skilled therapeutic intervention in order to improve the following deficits and impairments:   Pharyngoesophageal dysphagia    Problem List Patient Active Problem List   Diagnosis Date Noted  . Essential tremor 08/11/2015  . Male erectile dysfunction 07/16/2015  . Other abnormal glucose 07/16/2015  . Hyperlipidemia 07/16/2015  . Gastro-esophageal reflux disease without esophagitis 07/16/2015  . Essential (primary) hypertension 07/16/2015  . Malignant  neoplasm of prostate ([place]) 07/16/2015  . Tremor 07/16/2015  . Vertigo 08/24/2014  . History of hypercholesterolemia 08/24/2014  . Sinus bradycardia 08/24/2014  . TIA (transient ischemic attack)   .  Prostate cancer [facility])     Adam Demary, Annye Rusk MS, CCC-SLP 02/27/2018, 10:05 AM  [facility] [address] [place], [place], [address] Phone: [phone]   Fax:  [phone]   Name: Travis Morris MRN: [id number] Date of Birth: 06-12-1940

## 2018-02-28 ENCOUNTER — Inpatient Hospital Stay (HOSPITAL_COMMUNITY): Payer: Medicare Other

## 2018-02-28 ENCOUNTER — Inpatient Hospital Stay (HOSPITAL_COMMUNITY)
Admission: EM | Admit: 2018-02-28 | Discharge: 2018-03-01 | DRG: 057 | Disposition: A | Discharge status: Home / Self Care | Payer: Medicare Other | Attending: Internal Medicine | Admitting: Internal Medicine

## 2018-02-28 ENCOUNTER — Other Ambulatory Visit: Payer: Self-pay

## 2018-02-28 ENCOUNTER — Emergency Department (HOSPITAL_COMMUNITY): Payer: Medicare Other

## 2018-02-28 DIAGNOSIS — E785 Hyperlipidemia, unspecified: Secondary | ICD-10-CM | POA: Diagnosis present

## 2018-02-28 DIAGNOSIS — Z66 Do not resuscitate: Secondary | ICD-10-CM | POA: Diagnosis present

## 2018-02-28 DIAGNOSIS — H532 Diplopia: Secondary | ICD-10-CM | POA: Diagnosis present

## 2018-02-28 DIAGNOSIS — E876 Hypokalemia: Secondary | ICD-10-CM | POA: Diagnosis present

## 2018-02-28 DIAGNOSIS — K219 Gastro-esophageal reflux disease without esophagitis: Secondary | ICD-10-CM | POA: Diagnosis present

## 2018-02-28 DIAGNOSIS — G7000 Myasthenia gravis without (acute) exacerbation: Secondary | ICD-10-CM | POA: Diagnosis present

## 2018-02-28 DIAGNOSIS — R471 Dysarthria and anarthria: Secondary | ICD-10-CM | POA: Diagnosis present

## 2018-02-28 DIAGNOSIS — R490 Dysphonia: Secondary | ICD-10-CM | POA: Diagnosis present

## 2018-02-28 DIAGNOSIS — H518 Other specified disorders of binocular movement: Secondary | ICD-10-CM | POA: Diagnosis present

## 2018-02-28 DIAGNOSIS — Z8546 Personal history of malignant neoplasm of prostate: Secondary | ICD-10-CM | POA: Diagnosis not present

## 2018-02-28 DIAGNOSIS — N401 Enlarged prostate with lower urinary tract symptoms: Secondary | ICD-10-CM | POA: Diagnosis present

## 2018-02-28 DIAGNOSIS — H3530 Unspecified macular degeneration: Secondary | ICD-10-CM | POA: Diagnosis present

## 2018-02-28 DIAGNOSIS — Z8249 Family history of ischemic heart disease and other diseases of the circulatory system: Secondary | ICD-10-CM | POA: Diagnosis not present

## 2018-02-28 DIAGNOSIS — G629 Polyneuropathy, unspecified: Secondary | ICD-10-CM | POA: Diagnosis present

## 2018-02-28 DIAGNOSIS — I10 Essential (primary) hypertension: Secondary | ICD-10-CM | POA: Diagnosis present

## 2018-02-28 DIAGNOSIS — E7800 Pure hypercholesterolemia, unspecified: Secondary | ICD-10-CM | POA: Diagnosis present

## 2018-02-28 DIAGNOSIS — N3281 Overactive bladder: Secondary | ICD-10-CM | POA: Diagnosis present

## 2018-02-28 DIAGNOSIS — Z800 Family history of malignant neoplasm of digestive organs: Secondary | ICD-10-CM | POA: Diagnosis not present

## 2018-02-28 DIAGNOSIS — Z7982 Long term (current) use of aspirin: Secondary | ICD-10-CM | POA: Diagnosis not present

## 2018-02-28 DIAGNOSIS — G250 Essential tremor: Secondary | ICD-10-CM | POA: Diagnosis present

## 2018-02-28 DIAGNOSIS — R1310 Dysphagia, unspecified: Secondary | ICD-10-CM | POA: Diagnosis present

## 2018-02-28 LAB — CBC
HCT: 44.3 % (ref 39.0–52.0)
HCT: 43.8 % (ref 39.0–52.0)
Hemoglobin: 14.6 g/dL (ref 13.0–17.0)
Hemoglobin: 14.5 g/dL (ref 13.0–17.0)
MCH: 32 pg (ref 26.0–34.0)
MCH: 31.9 pg (ref 26.0–34.0)
MCHC: 33 g/dL (ref 30.0–36.0)
MCHC: 33.1 g/dL (ref 30.0–36.0)
MCV: 97.1 fL (ref 80.0–100.0)
MCV: 96.3 fL (ref 80.0–100.0)
NRBC: 0 % (ref 0.0–0.2)
Platelets: 211 10*3/uL (ref 150–400)
Platelets: 173 10*3/uL (ref 150–400)
RBC: 4.56 MIL/uL (ref 4.22–5.81)
RBC: 4.55 MIL/uL (ref 4.22–5.81)
RDW: 11.5 % (ref 11.5–15.5)
RDW: 11.5 % (ref 11.5–15.5)
WBC: 7.6 10*3/uL (ref 4.0–10.5)
WBC: 9.7 10*3/uL (ref 4.0–10.5)
nRBC: 0 % (ref 0.0–0.2)

## 2018-02-28 LAB — URINALYSIS, ROUTINE W REFLEX MICROSCOPIC
Bilirubin Urine: NEGATIVE
Glucose, UA: NEGATIVE mg/dL
Hgb urine dipstick: NEGATIVE
Ketones, ur: NEGATIVE mg/dL
Leukocytes,Ua: NEGATIVE
Nitrite: NEGATIVE
Protein, ur: NEGATIVE mg/dL
Specific Gravity, Urine: 1.024 (ref 1.005–1.030)
pH: 5 (ref 5.0–8.0)

## 2018-02-28 LAB — COMPREHENSIVE METABOLIC PANEL
ALT: 29 U/L (ref 0–44)
AST: 24 U/L (ref 15–41)
Albumin: 4.5 g/dL (ref 3.5–5.0)
Alkaline Phosphatase: 62 U/L (ref 38–126)
Anion gap: 8 (ref 5–15)
BILIRUBIN TOTAL: 0.6 mg/dL (ref 0.3–1.2)
BUN: 22 mg/dL (ref 8–23)
CO2: 30 mmol/L (ref 22–32)
Calcium: 9.3 mg/dL (ref 8.9–10.3)
Chloride: 102 mmol/L (ref 98–111)
Creatinine, Ser: 0.92 mg/dL (ref 0.61–1.24)
GFR calc Af Amer: >60 mL/min (ref >60)
Glucose, Bld: 113 mg/dL — ABNORMAL HIGH (ref 70–99)
Potassium: 3.3 mmol/L — ABNORMAL LOW (ref 3.5–5.1)
Sodium: 140 mmol/L (ref 135–145)
Total Protein: 7.4 g/dL (ref 6.5–8.1)

## 2018-02-28 LAB — RAPID URINE DRUG SCREEN, HOSP PERFORMED
Amphetamines: NOT DETECTED
Barbiturates: NOT DETECTED
Benzodiazepines: NOT DETECTED
COCAINE: NOT DETECTED
OPIATES: NOT DETECTED
Tetrahydrocannabinol: NOT DETECTED

## 2018-02-28 LAB — MRSA PCR SCREENING: MRSA by PCR: NEGATIVE

## 2018-02-28 LAB — DIFFERENTIAL
Abs Immature Granulocytes: 0.02 10*3/uL (ref 0.00–0.07)
Basophils Absolute: 0 10*3/uL (ref 0.0–0.1)
Basophils Relative: 1 %
Eosinophils Absolute: 0.5 10*3/uL (ref 0.0–0.5)
Eosinophils Relative: 6 %
Immature Granulocytes: 0 %
LYMPHS ABS: 1.9 10*3/uL (ref 0.7–4.0)
Lymphocytes Relative: 25 %
Monocytes Absolute: 0.7 10*3/uL (ref 0.1–1.0)
Monocytes Relative: 9 %
Neutro Abs: 4.5 10*3/uL (ref 1.7–7.7)
Neutrophils Relative %: 59 %

## 2018-02-28 LAB — ETHANOL: Alcohol, Ethyl (B): <10 mg/dL (ref <10)

## 2018-02-28 LAB — PROTIME-INR
INR: 1.01
PROTHROMBIN TIME: 13.2 s (ref 11.4–15.2)

## 2018-02-28 LAB — APTT: aPTT: 31 seconds (ref 24–36)

## 2018-02-28 LAB — CREATININE, SERUM
Creatinine, Ser: 0.94 mg/dL (ref 0.61–1.24)
GFR calc Af Amer: >60 mL/min (ref >60)
GFR calc non Af Amer: >60 mL/min (ref >60)

## 2018-02-28 MED ORDER — ATORVASTATIN CALCIUM 10 MG PO TABS
20.0000 mg | ORAL_TABLET | Freq: Every day | ORAL | Status: DC
  Administered 2018-02-28 – 2018-03-01 (×2): 20 mg via ORAL
  Filled 2018-02-28: qty 2
  Filled 2018-02-28: qty 1

## 2018-02-28 MED ORDER — ACETAMINOPHEN 325 MG PO TABS: 650.0000 mg | ORAL_TABLET | Freq: Four times a day (QID) | ORAL | Status: DC | PRN

## 2018-02-28 MED ORDER — PREDNISONE 20 MG PO TABS
20.0000 mg | ORAL_TABLET | Freq: Every day | ORAL | Status: DC
  Administered 2018-02-28 – 2018-03-01 (×2): 20 mg via ORAL
  Filled 2018-02-28 (×2): qty 1

## 2018-02-28 MED ORDER — ASPIRIN EC 81 MG PO TBEC
81.0000 mg | DELAYED_RELEASE_TABLET | Freq: Every day | ORAL | Status: DC
  Administered 2018-02-28 – 2018-03-01 (×2): 81 mg via ORAL
  Filled 2018-02-28 (×2): qty 1

## 2018-02-28 MED ORDER — PYRIDOSTIGMINE BROMIDE 60 MG PO TABS
30.0000 mg | ORAL_TABLET | Freq: Once | ORAL | Status: AC
  Administered 2018-02-28: 30 mg via ORAL
  Filled 2018-02-28: qty 0.5

## 2018-02-28 MED ORDER — VITAMIN C 250 MG PO TABS
125.0000 mg | ORAL_TABLET | Freq: Every day | ORAL | Status: DC
  Administered 2018-02-28 – 2018-03-01 (×2): 125 mg via ORAL
  Filled 2018-02-28 (×2): qty 1

## 2018-02-28 MED ORDER — PANTOPRAZOLE SODIUM 40 MG PO TBEC
40.0000 mg | DELAYED_RELEASE_TABLET | Freq: Every day | ORAL | Status: DC
  Administered 2018-02-28 – 2018-03-01 (×2): 40 mg via ORAL
  Filled 2018-02-28 (×2): qty 1

## 2018-02-28 MED ORDER — ADULT MULTIVITAMIN W/MINERALS CH
1.0000 | ORAL_TABLET | Freq: Every day | ORAL | Status: DC
  Administered 2018-02-28 – 2018-03-01 (×2): 1 via ORAL
  Filled 2018-02-28 (×3): qty 1

## 2018-02-28 MED ORDER — POTASSIUM CHLORIDE CRYS ER 20 MEQ PO TBCR
40.0000 meq | EXTENDED_RELEASE_TABLET | Freq: Once | ORAL | Status: AC
  Administered 2018-02-28: 40 meq via ORAL
  Filled 2018-02-28: qty 2

## 2018-02-28 MED ORDER — POLYETHYLENE GLYCOL 3350 17 G PO PACK: 17.0000 g | PACK | Freq: Every day | ORAL | Status: DC | PRN

## 2018-02-28 MED ORDER — ACETAMINOPHEN 650 MG RE SUPP: 650.0000 mg | Freq: Four times a day (QID) | RECTAL | Status: DC | PRN

## 2018-02-28 MED ORDER — ENOXAPARIN SODIUM 40 MG/0.4ML ~~LOC~~ SOLN
40.0000 mg | SUBCUTANEOUS | Status: DC
  Administered 2018-02-28: 40 mg via SUBCUTANEOUS
  Filled 2018-02-28 (×2): qty 0.4

## 2018-02-28 MED ORDER — METOPROLOL SUCCINATE ER 25 MG PO TB24
50.0000 mg | ORAL_TABLET | Freq: Every day | ORAL | Status: DC
  Administered 2018-02-28 – 2018-03-01 (×2): 50 mg via ORAL
  Filled 2018-02-28: qty 2
  Filled 2018-02-28: qty 1

## 2018-02-28 MED ORDER — PYRIDOSTIGMINE BROMIDE 60 MG PO TABS
60.0000 mg | ORAL_TABLET | Freq: Three times a day (TID) | ORAL | Status: DC
  Administered 2018-02-28 – 2018-03-01 (×4): 60 mg via ORAL
  Filled 2018-02-28 (×6): qty 1

## 2018-02-28 MED ORDER — OXYBUTYNIN CHLORIDE ER 5 MG PO TB24
5.0000 mg | ORAL_TABLET | Freq: Three times a day (TID) | ORAL | Status: DC
  Administered 2018-02-28 – 2018-03-01 (×4): 5 mg via ORAL
  Filled 2018-02-28 (×5): qty 1

## 2018-02-28 NOTE — ED Provider Notes (Signed)
[facility] DEPT Provider Note  CSN: [id number] Arrival date & time: 02/27/18 2237  Chief Complaint(s) Diplopia  HPI Travis Morris is a 78 y.o. male with a past medical history listed below who presents to the emergency department with approximately 12 hours of double vision.  Patient reports that he woke yesterday and noted double vision.  He went about his day without improvement.  He denied any associated weakness or loss of sensation.  Patient did report the point the past several weeks he has been dealing and being worked up for trouble swallowing and change in speech.  He denied any prior history of strokes.  No recent fevers or infections.  No chest pain or shortness of breath.  No nausea or vomiting.  No abdominal pain.  No other physical complaints.  Patient did report that he had vitreous repair and uvuloplasty in the past.   The history is provided by the patient.    Past Medical History Past Medical History:  Diagnosis Date  . BPH with urinary obstruction   . GERD (gastroesophageal reflux disease)    ESOPHAGEAL REFLUX HX  . History of balanitis   . History of phimosis   . History of repair of rotator cuff   . Hx of biopsy    PENIS CUTANEOUS HX  . Hypercholesteremia   . Hypertension   . Macular hole    AIR FLUID EXCHANGE   . Organic impotence   . Prostate cancer [facility])    Patient Active Problem List   Diagnosis Date Noted  . Essential tremor 08/11/2015  . Male erectile dysfunction 07/16/2015  . Other abnormal glucose 07/16/2015  . Hyperlipidemia 07/16/2015  . Gastro-esophageal reflux disease without esophagitis 07/16/2015  . Essential (primary) hypertension 07/16/2015  . Malignant neoplasm of prostate ([place]) 07/16/2015  . Tremor 07/16/2015  . Vertigo 08/24/2014  . History of hypercholesterolemia 08/24/2014  . Sinus bradycardia 08/24/2014  . TIA (transient ischemic attack)   . Prostate cancer ([place])    Home  Medication(s) Prior to Admission medications   Medication Sig Start Date End Date Taking? Authorizing Provider  Ascorbic Acid (VITAMIN C) 100 MG tablet Take 100 mg by mouth daily.   Yes [provider]  aspirin 81 MG tablet Take 81 mg by mouth daily.     Yes [provider]  atorvastatin (LIPITOR) 20 MG tablet Take 20 mg by mouth daily.   Yes [provider]  losartan-hydrochlorothiazide (HYZAAR) 100-25 MG tablet Take 1 tablet by mouth daily. 08/26/15  Yes Lelon Perla, MD  metoprolol succinate (TOPROL-XL) 50 MG 24 hr tablet Take 1 tablet (50 mg total) by mouth daily. NEED OV. 03/09/17  Yes Lelon Perla, MD  Multiple Vitamin (MULTIVITAMINS PO) Take 1 tablet by mouth daily. CENTRUM SILVER    Yes [provider]  omeprazole (PRILOSEC) 20 MG capsule Take 20 mg by mouth daily.     Yes [provider]  oxybutynin (DITROPAN-XL) 5 MG 24 hr tablet Take 5 mg by mouth 3 (three) times daily.    Yes [provider]  vitamin E 400 UNIT capsule Take 400 Units by mouth daily.     Yes [provider]  Past Surgical History Past Surgical History:  Procedure Laterality Date  . APPENDECTOMY    . PARS PLANA VITRECTOMY W/ REPAIR OF MACULAR HOLE     x3  . ROTATOR CUFF REPAIR Right   . UVULOPLASTY     LASER ASSISTED   Family History Family History  Problem Relation Age of Onset  . Dementia Father   . Colon cancer Brother        mets to liver   . Liver cancer Brother   . Heart attack Brother        MI at age 104  . Healthy Son   . Healthy Daughter     Social History Social History   Tobacco Use  . Smoking status: Never Smoker  . Smokeless tobacco: Never Used  Substance Use Topics  . Alcohol use: Yes    Alcohol/week: 0.0 standard drinks    Comment: 2 oz gin weekly  . Drug use: No    Allergies Patient has no known allergies.  Review of Systems Review of Systems All other systems are reviewed and are negative for acute change except as noted in the HPI  Physical Exam Vital Signs  I have reviewed the triage vital signs BP 123/61 (BP Location: Right Arm)   Pulse (!) 55   Temp 97.8 F (36.6 C)   Resp (!) 22   Ht 5\' 8"  (1.727 m)   Wt 82.6 kg   SpO2 95%   BMI 27.67 kg/m   Physical Exam Vitals signs reviewed.  Constitutional:      General: He is not in acute distress.    Appearance: He is well-developed. He is not diaphoretic.  HENT:     Head: Normocephalic and atraumatic.     Nose: Nose normal.  Eyes:     General: No scleral icterus.       Right eye: No discharge.        Left eye: No discharge.     Extraocular Movements: Extraocular movements intact.     Conjunctiva/sclera: Conjunctivae normal.     Pupils: Pupils are equal, round, and reactive to light.     Comments: Diplopia resolves by covering each eye. Acuity: 20/200 on left 20/100 on right Visual fields intact  Neck:     Musculoskeletal: Normal range of motion and neck supple.  Cardiovascular:     Rate and Rhythm: Normal rate and regular rhythm.     Heart sounds: No murmur. No friction rub. No gallop.   Pulmonary:     Effort: Pulmonary effort is normal. No respiratory distress.     Breath sounds: Normal breath sounds. No stridor. No rales.  Abdominal:     General: There is no distension.     Palpations: Abdomen is soft.     Tenderness: There is no abdominal tenderness.  Musculoskeletal:        General: No tenderness.  Skin:    General: Skin is warm and dry.     Findings: No erythema or rash.  Neurological:     Mental Status: He is alert and oriented to person, place, and time.     Comments: Mental Status:  Alert and oriented to person, place, and time.  Attention and concentration normal.  Speech dysarthric.  Recent memory is intact  Cranial Nerves:  II Visual Fields: Intact  to confrontation. Visual fields intact. III, IV, VI: Pupils equal and reactive to light and near. Full eye movement without nystagmus  V Facial Sensation: Normal. No weakness of masticatory muscles  VII: No facial weakness or asymmetry  VIII Auditory Acuity: Grossly normal  IX/X: The uvula is midline; the palate elevates symmetrically  XI: Normal sternocleidomastoid and trapezius strength  XII: The tongue is midline. No atrophy or fasciculations.   Motor System: Muscle Strength: 5/5 and symmetric in the upper and lower extremities. No pronation or drift.  Muscle Tone: Tone and muscle bulk are normal in the upper and lower extremities.   Reflexes: DTRs: 1+ and symmetrical in all four extremities. No Clonus Coordination: Intact finger-to-nose, heel-to-shin. Essential tremor.  Sensation: Intact to light touch.  Gait: Routine  gait normal.      ED Results and Treatments Labs (all labs ordered are listed, but only abnormal results are displayed) Labs Reviewed  COMPREHENSIVE METABOLIC PANEL - Abnormal; Notable for the following components:      Result Value   Potassium 3.3 (*)    Glucose, Bld 113 (*)    All other components within normal limits  ETHANOL  PROTIME-INR  APTT  CBC  DIFFERENTIAL  RAPID URINE DRUG SCREEN, HOSP PERFORMED  URINALYSIS, ROUTINE W REFLEX MICROSCOPIC  STRIATED MUSCLE ANTIBODY  ACETYLCHOLINE RECEPTOR AB, ALL  MUSK ANTIBODIES                                                                                                                         EKG  EKG Interpretation  Date/Time:    Ventricular Rate:    PR Interval:    QRS Duration:   QT Interval:    QTC Calculation:   R Axis:     Text Interpretation:        Radiology Ct Head Wo Contrast  Result Date: 02/28/2018 CLINICAL DATA:  Diplopia EXAM: CT HEAD WITHOUT CONTRAST TECHNIQUE: Contiguous axial images were obtained from the base of the skull through the vertex without intravenous contrast.  COMPARISON:  Head CT 08/23/2014 FINDINGS: Brain: There is no mass, hemorrhage or extra-axial collection. The size and configuration of the ventricles and extra-axial CSF spaces are normal. There is hypoattenuation of the white matter, most commonly indicating chronic small vessel disease. Vascular: No abnormal hyperdensity of the major intracranial arteries or dural venous sinuses. No intracranial atherosclerosis. Skull: The visualized skull base, calvarium and extracranial soft tissues are normal. Sinuses/Orbits: No fluid levels or advanced mucosal thickening of the visualized paranasal sinuses. No mastoid or middle ear effusion. The orbits are normal. IMPRESSION: Mild chronic small vessel disease without acute abnormality. Electronically Signed   By: Ulyses Jarred M.D.   On: 02/28/2018 05:30   Pertinent labs & imaging results that were available during my care of the patient were reviewed by me and considered in my medical decision making (see chart for details).  Medications Ordered in ED Medications  pyridostigmine (MESTINON) tablet 30 mg (30 mg Oral Given 02/28/18 0718)  Procedures Procedures  (including critical care time)  Medical Decision Making / ED Course I have reviewed the nursing notes for this encounter and the patient's prior records (if available in EHR or on provided paperwork).    No large vessel occlusion findings on exam.  Patient has been ocular diplopia.  No obvious cranial nerve palsy.  Possible CVA versus autoimmune disorder.  CT head negative.  Labs reassuring without significant electrolyte derangements.  Tele-neurology consulted who is concern for myasthenia gravis versus PSP.  Recommended admission for stroke rule out and additional myasthenia gravis work-up.  Recommended Mestinon and steroids.  I discussed the case with hospitalist  neurologist who requested we hold steroids at this time.    Final Clinical Impression(s) / ED Diagnoses Final diagnoses:  Binocular vision disorder with diplopia  Dysarthria      This chart was dictated using voice recognition software.  Despite best efforts to proofread,  errors can occur which can change the documentation meaning.   Fatima Blank, MD 02/28/18 [phone]

## 2018-02-28 NOTE — ED Notes (Signed)
Patient ambulated to restroom with 1 assist.

## 2018-02-28 NOTE — Consult Note (Signed)
   [facility] Consult Services  Impression: Patient is a 78 yo male with a PMH of BPH, HTN, HLD, essential tremor, GERD who presents with a several week history of dysphagia, dysphonia and now several day h/o of intermittent diplopia (now present over 24 hours). CTH NAF. On exam he has poor oral praxis, with dysphonia, mild reproducible ptosis and binocular diplopia.  Differential includes Myasthenia gravis, without crisis clinically, possible exacerbation. Parkinsonism- PSP vs MSA, subacute stroke vs motor neuron disease.    Recommendations:  Swallow eval, with dysphagia precautions Rec checking Ach Receptor Ab (binding, modulating, blocking), striated mm ab, MuSK ab. Followed by initiation of prednisone, following bedside swallow. Trial of mestinon 30mg  (intermittent bradycardia in the ED), can consider up to TID as tolerated Check NiF Rec admission on telemetry for further w/u including MRI brain, ST, consider vascular imaging.  Rec reviewed with ED MD ---------------------------------------------------------------------  CC: diplopia   History of Present Illness:   Patient is a 78 yo male with a PMH of BPH, HTN, HLD, GERD who presents with a several week history of dysphagia, dysphonia and now several day h/o of intermittent diplopia (now present over 24 hours). He first developed dysphagia and dysphonia progressive over some weeks, in December 2019. He is undergoing ST, with pending outpt neurology consult. On Sunday evening he noticed mild diplopia, this again recurren Monday evening. He awoke Tuesday AM with vertical diplopia which has not resolved. Binocular. He denies headache, facial droop, limb weakness or numbness. No preceding trauma. No SOB.  Symptom intensity fluctuates, seemingly worse with usage/ vs diurnal variation.   Diagnostic Testing: As above  Vital Signs:   Reviewed  Exam:  Mental Status:  Awake, alert, oriented  Dysphonic speech Poor oral  praxis Hypomimia   Cranial Nerves:  Extraocular movements: Intact in all cardinal gaze Ptosis: Present mild, in creased with sustained upward gaze Visual fields: Intact to finger counting Facial sensation: Intact to pin and light touch Facial movements: Intact and symmetric    Motor Exam:  No drift   Tremor/Abnormal Movements:  Resting tremor: Absent Intention tremor: Present  Sensory Exam:   Light touch: Intact Pinprick: Intact    Coordination:   Finger to nose: Intact   Medical Decision Making:  - Extensive number of diagnosis or management options are considered above.   - Extensive amount of complex data reviewed.   - High risk of complication and/or morbidity or mortality are associated with differential diagnostic considerations above.  - There may be uncertain outcome and increased probability of prolonged functional impairment or high probability of severe prolonged functional impairment associated with some of these differential diagnosis.   Medical Data Reviewed:  1.Data reviewed include clinical labs, radiology,  Medical Tests;   2.Tests results discussed w/performing or interpreting physician;   3.Obtaining/reviewing old medical records;  4.Obtaining case history from another source;  5.Independent review of image, tracing or specimen.    Patient was informed the Neurology Consult would happen via telehealth (remote video) and consented to receiving care in this manner.

## 2018-02-28 NOTE — H&P (Signed)
History and Physical    Travis Morris DOB: [birth year] DOA: 02/28/2018  PCP: London Pepper, MD  Patient coming from: home  I have personally briefly reviewed patient's old medical records in [place]  Chief Complaint: double vision  HPI: Travis Morris is Travis Morris 78 y.o. male with medical history significant of essential tremor, reflux, prostate cancer, hypertension who presented with diplopia after weeks of dysphasia and dysphonia, concerning for myasthenia gravis.  Patient notes that he has had double vision since yesterday.  While he was driving he saw 2 cars and while he was walking down the stairs he thought 2 sets of steps.  He presented to the emergency department because of his double vision.  He is noticed swallowing problems since December.  He notes these primarily with solids.  He also has noted some difficulty speaking as well.  He notes that his symptoms seem to be better in the morning.  He notes that his after eating when his speech gets bad.  He denies any weakness.  He notes [place] history of peripheral neuropathy.  He denies fevers or chills.  He notes [place] cough.  He denies chest pain or shortness of breath.  He denies abdominal pain, nausea, vomiting.  He denies smoking or alcohol use.  ED Course: Labs, head CT, MRI. Tele neuro recommending work up for myasthenia.  Review of Systems: As per HPI otherwise 10 point review of systems negative.   Past Medical History:  Diagnosis Date  . BPH with urinary obstruction   . GERD (gastroesophageal reflux disease)    ESOPHAGEAL REFLUX HX  . History of balanitis   . History of phimosis   . History of repair of rotator cuff   . Hx of biopsy    PENIS CUTANEOUS HX  . Hypercholesteremia   . Hypertension   . Macular hole    AIR FLUID EXCHANGE   . Organic impotence   . Prostate cancer [facility])     Past Surgical History:  Procedure Laterality Date  . APPENDECTOMY    . PARS PLANA VITRECTOMY W/ REPAIR OF MACULAR  HOLE     x3  . ROTATOR CUFF REPAIR Right   . UVULOPLASTY     LASER ASSISTED     reports that he has never smoked. He has never used smokeless tobacco. He reports current alcohol use. He reports that he does not use drugs.  No Known Allergies  Family History  Problem Relation Age of Onset  . Dementia Father   . Colon cancer Brother        mets to liver   . Liver cancer Brother   . Heart attack Brother        MI at age 12  . Healthy Son   . Healthy Daughter    Prior to Admission medications   Medication Sig Start Date End Date Taking? Authorizing Provider  Ascorbic Acid (VITAMIN C) 100 MG tablet Take 100 mg by mouth daily.   Yes [provider]  aspirin 81 MG tablet Take 81 mg by mouth daily.     Yes [provider]  atorvastatin (LIPITOR) 20 MG tablet Take 20 mg by mouth daily.   Yes [provider]  losartan-hydrochlorothiazide (HYZAAR) 100-25 MG tablet Take 1 tablet by mouth daily. 08/26/15  Yes Lelon Perla, MD  metoprolol succinate (TOPROL-XL) 50 MG 24 hr tablet Take 1 tablet (50 mg total) by mouth daily. NEED OV. 03/09/17  Yes Crenshaw, Denice Bors, MD  Multiple Vitamin (MULTIVITAMINS PO) Take 1 tablet by mouth daily. CENTRUM SILVER    Yes [provider]  omeprazole (PRILOSEC) 20 MG capsule Take 20 mg by mouth daily.     Yes [provider]  oxybutynin (DITROPAN-XL) 5 MG 24 hr tablet Take 5 mg by mouth 3 (three) times daily.    Yes [provider]  vitamin E 400 UNIT capsule Take 400 Units by mouth daily.     Yes [provider]    Physical Exam: Vitals:   02/28/18 1200 02/28/18 1215 02/28/18 1253 02/28/18 1330  BP: 136/72   137/67  Pulse: 61 (!) 58 60 (!) 58  Resp: 17 18 20 18   Temp:      TempSrc:      SpO2: 96% 94% 96% 96%  Weight:      Height:        Constitutional: NAD, calm, comfortable Vitals:   02/28/18 1200 02/28/18 1215 02/28/18 1253 02/28/18 1330  BP: 136/72   137/67  Pulse: 61 (!) 58 60  (!) 58  Resp: 17 18 20 18   Temp:      TempSrc:      SpO2: 96% 94% 96% 96%  Weight:      Height:       Eyes: PERRL, lids and conjunctivae normal ENMT: Mucous membranes are moist. Posterior pharynx clear of any exudate or lesions.Normal dentition.  Neck: normal, supple, no masses, no thyromegaly Respiratory: clear to auscultation bilaterally, no wheezing, no crackles. Normal respiratory effort. No accessory muscle use.  Cardiovascular: Regular rate and rhythm, no murmurs / rubs / gallops. No extremity edema. 2+ pedal pulses. Abdomen: no tenderness, no masses palpated. No hepatosplenomegaly. Bowel sounds positive.  Musculoskeletal: no clubbing / cyanosis. No joint deformity upper and lower extremities. Good ROM, no contractures. Normal muscle tone.  Skin: no rashes, lesions, ulcers. No induration Neurologic: CN 2-12 intact. Sensation intact. Strength 5/5 in all 4.  Visual fields intact.  FNF intact.  Psychiatric: Normal judgment and insight. Alert and oriented x 3. Normal mood.   Labs on Admission: I have personally reviewed following labs and imaging studies  CBC: Recent Labs  Lab 02/28/18 0318  WBC 7.6  NEUTROABS 4.5  HGB 14.6  HCT 44.3  MCV 97.1  PLT 102   Basic Metabolic Panel: Recent Labs  Lab 02/28/18 0318  NA 140  K 3.3*  CL 102  CO2 30  GLUCOSE 113*  BUN 22  CREATININE 0.92  CALCIUM 9.3   GFR: Estimated Creatinine Clearance: 68.2 mL/min (by C-G formula based on SCr of 0.92 mg/dL). Liver Function Tests: Recent Labs  Lab 02/28/18 0318  AST 24  ALT 29  ALKPHOS 62  BILITOT 0.6  PROT 7.4  ALBUMIN 4.5   No results for input(s): LIPASE, AMYLASE in the last 168 hours. No results for input(s): AMMONIA in the last 168 hours. Coagulation Profile: Recent Labs  Lab 02/28/18 0318  INR 1.01   Cardiac Enzymes: No results for input(s): CKTOTAL, CKMB, CKMBINDEX, TROPONINI in the last 168 hours. BNP (last 3 results) No results for input(s): PROBNP in the last  8760 hours. HbA1C: No results for input(s): HGBA1C in the last 72 hours. CBG: No results for input(s): GLUCAP in the last 168 hours. Lipid Profile: No results for input(s): CHOL, HDL, LDLCALC, TRIG, CHOLHDL, LDLDIRECT in the last 72 hours. Thyroid Function Tests: No results for input(s): TSH, T4TOTAL, FREET4, T3FREE, THYROIDAB in the last 72 hours. Anemia Panel: No results for input(s): VITAMINB12,  FOLATE, FERRITIN, TIBC, IRON, RETICCTPCT in the last 72 hours. Urine analysis:    Component Value Date/Time   COLORURINE YELLOW 02/28/2018 [place] 02/28/2018 0318   LABSPEC 1.024 02/28/2018 0318   PHURINE 5.0 02/28/2018 0318   GLUCOSEU NEGATIVE 02/28/2018 0318   HGBUR NEGATIVE 02/28/2018 0318   BILIRUBINUR NEGATIVE 02/28/2018 0318   KETONESUR NEGATIVE 02/28/2018 0318   PROTEINUR NEGATIVE 02/28/2018 0318   UROBILINOGEN 0.2 08/23/2014 2359   NITRITE NEGATIVE 02/28/2018 0318   LEUKOCYTESUR NEGATIVE 02/28/2018 0318    Radiological Exams on Admission: Ct Head Wo Contrast  Result Date: 02/28/2018 CLINICAL DATA:  Diplopia EXAM: CT HEAD WITHOUT CONTRAST TECHNIQUE: Contiguous axial images were obtained from the base of the skull through the vertex without intravenous contrast. COMPARISON:  Head CT 08/23/2014 FINDINGS: Brain: There is no mass, hemorrhage or extra-axial collection. The size and configuration of the ventricles and extra-axial CSF spaces are normal. There is hypoattenuation of the white matter, most commonly indicating chronic small vessel disease. Vascular: No abnormal hyperdensity of the major intracranial arteries or dural venous sinuses. No intracranial atherosclerosis. Skull: The visualized skull base, calvarium and extracranial soft tissues are normal. Sinuses/Orbits: No fluid levels or advanced mucosal thickening of the visualized paranasal sinuses. No mastoid or middle ear effusion. The orbits are normal. IMPRESSION: Mild chronic small vessel disease without  acute abnormality. Electronically Signed   By: Ulyses Jarred M.D.   On: 02/28/2018 05:30   Mr Brain Wo Contrast  Result Date: 02/28/2018 CLINICAL DATA:  Diplopia for 1 day. EXAM: MRI HEAD WITHOUT CONTRAST TECHNIQUE: Multiplanar, multiecho pulse sequences of the brain and surrounding structures were obtained without intravenous contrast. COMPARISON:  Head CT 02/28/2018 and MRI 08/24/2014 FINDINGS: Brain: There is no evidence of acute infarct, intracranial hemorrhage, mass, midline shift, or extra-axial fluid collection. The ventricles and sulci are within normal limits for age. Scattered small foci of T2 hyperintensity in the cerebral white matter are unchanged from the prior MRI and nonspecific but compatible with chronic small vessel ischemic disease, very mild for age. Vascular: Major intracranial vascular flow voids are preserved. Skull and upper cervical spine: Unremarkable bone marrow signal. Sinuses/Orbits: Bilateral cataract extraction. Mild scattered mucosal thickening in the paranasal sinuses. Clear mastoid air cells. Other: None. IMPRESSION: 1. No acute intracranial abnormality. 2. Mild chronic small vessel ischemic disease. Electronically Signed   By: Logan Bores M.D.   On: 02/28/2018 10:07    EKG: Independently reviewed. Sinus brady  Assessment/Plan Active Problems:   Diplopia  Diplopia  Dysphonia  Dysphagia:  Several weeks of dysphagia and dysphonia.  Pt with diplopia that started yesterday.  He's been seen by teleneurology here who recommended trial of mestinon and prednisone.  Follow Ach Recebtor Ab, striated mm ab, MuSK ab. Per discussion with neurology by EDP, holding off on steroids at this time Q6 NIF Head CT without acute abnormality MRI without acute abnormality Neuro c/s, appreciate recs   Hypokalemia: replace, follow  Hypertension: continue metop.  Holding losartan/HCTZ for now (sounds like he no longer takes combination pill).    HLD: continue lipitor  Overactive  Bladder: continue oxybutynin  DVT prophylaxis: lovenox  Code Status: DNR, per discussion with pt Family Communication: none at bedside Disposition Plan: pending further eval by neuro  Consults called: neurology  Admission status: inpatient given new onset diplopia with concern for myasthenia, need for neurology evaluation   Fayrene Helper MD [facility] Pager [email or web address]  If 7PM-7AM, please contact night-coverage [email or web address] Password [email or web address]  02/28/2018, 1:58 PM

## 2018-02-28 NOTE — ED Notes (Signed)
Report given to [place], RN on 2W.

## 2018-02-28 NOTE — Progress Notes (Signed)
NIF -50

## 2018-02-28 NOTE — ED Notes (Signed)
Spoke to [place], MD. Patient can be placed on a heart healthy. MD  Will put in the orders.

## 2018-02-28 NOTE — ED Notes (Signed)
Patient complains of diplopia still, said "I see two of everything."

## 2018-02-28 NOTE — ED Notes (Signed)
Transported to MRI. Belongings in belonging bag in MRI.

## 2018-02-28 NOTE — ED Notes (Signed)
ED TO INPATIENT HANDOFF REPORT  Name/Age/Gender Travis Morris 78 y.o. male  Code Status    Code Status Orders  (From admission, onward)         Start     Ordered   02/28/18 1135  Do not attempt resuscitation (DNR)  Continuous    Question Answer Comment  In the event of cardiac or respiratory ARREST Do not call a "code blue"   In the event of cardiac or respiratory ARREST Do not perform Intubation, CPR, defibrillation or ACLS   In the event of cardiac or respiratory ARREST Use medication by any route, position, wound care, and other measures to relive pain and suffering. May use oxygen, suction and manual treatment of airway obstruction as needed for comfort.      02/28/18 1135        Code Status History    Date Active Date Inactive Code Status Order ID Comments User Context   08/24/2014 0341 08/25/2014 2057 Full Code [id number]  Theressa Millard, MD Inpatient   08/24/2014 0242 08/24/2014 0341 Full Code [id number]  Theressa Millard, MD ED    Advance Directive Documentation     Most Recent Value  Type of Advance Directive  Healthcare Power of Attorney  Pre-existing out of facility DNR order (yellow form or pink MOST form)  -  "MOST" Form in Place?  -      Home/SNF/Other Home  Chief Complaint Diplopia  Level of Care/Admitting Diagnosis ED Disposition    ED Disposition Condition [place]: [facility] [[id number]]  Level of Care: Stepdown [14]  Admit to SDU based on following criteria: Hemodynamic compromise or significant risk of instability:  Patient requiring short term acute titration and management of vasoactive drips, and invasive monitoring (i.e., CVP and Arterial line).  Admit to SDU based on following criteria: Severe physiological/psychological symptoms:  Any diagnosis requiring assessment & intervention at least every 4 hours on an ongoing basis to obtain desired patient outcomes including stability and rehabilitation  Diagnosis: Diplopia [368.2.ICD-9-CM]  Admitting Physician: Elodia Florence [[id number]]  Attending Physician: Cephus Slater, A CALDWELL [phone]  Estimated length of stay: past midnight tomorrow  Certification:: I certify this patient will need inpatient services for at least 2 midnights  PT Class (Do Not Modify): Inpatient [101]  PT Acc Code (Do Not Modify): Private [1]       Medical History Past Medical History:  Diagnosis Date  . BPH with urinary obstruction   . GERD (gastroesophageal reflux disease)    ESOPHAGEAL REFLUX HX  . History of balanitis   . History of phimosis   . History of repair of rotator cuff   . Hx of biopsy    PENIS CUTANEOUS HX  . Hypercholesteremia   . Hypertension   . Macular hole    AIR FLUID EXCHANGE   . Organic impotence   . Prostate cancer ([place])     Allergies No Known Allergies  IV Location/Drains/Wounds Patient Lines/Drains/Airways Status   Active Line/Drains/Airways    Name:   Placement date:   Placement time:   Site:   Days:   Peripheral IV 02/28/18 Left Antecubital   02/28/18    0338    Antecubital   less than 1          Labs/Imaging Results for orders placed or performed during the hospital encounter of 02/28/18 (from the past 48 hour(s))  Ethanol     Status: None  Collection Time: 02/28/18  3:18 AM  Result Value Ref Range   Alcohol, Ethyl (B) <10 <10 mg/dL    Comment: (NOTE) Lowest detectable limit for serum alcohol is 10 mg/dL. For medical purposes only. Performed at [facility], [place] [address] [place], [place] [address]   Protime-INR     Status: None   Collection Time: 02/28/18  3:18 AM  Result Value Ref Range   Prothrombin Time 13.2 11.4 - 15.2 seconds   INR 1.01     Comment: Performed at [facility], [place] [address], [place], [place] [address]  APTT     Status: None   Collection Time: 02/28/18  3:18 AM  Result Value Ref Range   aPTT 31 24 - 36 seconds    Comment: Performed at  [facility], [place] [address] [place], [place] [address]  CBC     Status: None   Collection Time: 02/28/18  3:18 AM  Result Value Ref Range   WBC 7.6 4.0 - 10.5 K/uL   RBC 4.56 4.22 - 5.81 MIL/uL   Hemoglobin 14.6 13.0 - 17.0 g/dL   HCT 44.3 39.0 - 52.0 %   MCV 97.1 80.0 - 100.0 fL   MCH 32.0 26.0 - 34.0 pg   MCHC 33.0 30.0 - 36.0 g/dL   RDW 11.5 11.5 - 15.5 %   Platelets 211 150 - 400 K/uL   nRBC 0.0 0.0 - 0.2 %    Comment: Performed at [facility], [place] [address] [place], [place] [address]  Differential     Status: None   Collection Time: 02/28/18  3:18 AM  Result Value Ref Range   Neutrophils Relative % 59 %   Neutro Abs 4.5 1.7 - 7.7 K/uL   Lymphocytes Relative 25 %   Lymphs Abs 1.9 0.7 - 4.0 K/uL   Monocytes Relative 9 %   Monocytes Absolute 0.7 0.1 - 1.0 K/uL   Eosinophils Relative 6 %   Eosinophils Absolute 0.5 0.0 - 0.5 K/uL   Basophils Relative 1 %   Basophils Absolute 0.0 0.0 - 0.1 K/uL   Immature Granulocytes 0 %   Abs Immature Granulocytes 0.02 0.00 - 0.07 K/uL    Comment: Performed at [facility], [place] [address] [place], [place] [address]  Comprehensive metabolic panel     Status: Abnormal   Collection Time: 02/28/18  3:18 AM  Result Value Ref Range   Sodium 140 135 - 145 mmol/L   Potassium 3.3 (L) 3.5 - 5.1 mmol/L   Chloride 102 98 - 111 mmol/L   CO2 30 22 - 32 mmol/L   Glucose, Bld 113 (H) 70 - 99 mg/dL   BUN 22 8 - 23 mg/dL   Creatinine, Ser 0.92 0.61 - 1.24 mg/dL   Calcium 9.3 8.9 - 10.3 mg/dL   Total Protein 7.4 6.5 - 8.1 g/dL   Albumin 4.5 3.5 - 5.0 g/dL   AST 24 15 - 41 U/L   ALT 29 0 - 44 U/L   Alkaline Phosphatase 62 38 - 126 U/L   Total Bilirubin 0.6 0.3 - 1.2 mg/dL   GFR calc non Af Amer >60 >60 mL/min   GFR calc Af Amer >60 >60 mL/min   Anion gap 8 5 - 15    Comment: Performed at [facility], [place] [address] [place], [place] [address]  Urine rapid drug screen  (hosp performed)     Status: None  Collection Time: 02/28/18  3:18 AM  Result Value Ref Range   Opiates NONE DETECTED NONE DETECTED   Cocaine NONE DETECTED NONE DETECTED   Benzodiazepines NONE DETECTED NONE DETECTED   Amphetamines NONE DETECTED NONE DETECTED   Tetrahydrocannabinol NONE DETECTED NONE DETECTED   Barbiturates NONE DETECTED NONE DETECTED    Comment: (NOTE) DRUG SCREEN FOR MEDICAL PURPOSES ONLY.  IF CONFIRMATION IS NEEDED FOR ANY PURPOSE, NOTIFY LAB WITHIN 5 DAYS. LOWEST DETECTABLE LIMITS FOR URINE DRUG SCREEN Drug Class                     Cutoff (ng/mL) Amphetamine and metabolites    1000 Barbiturate and metabolites    200 Benzodiazepine                 678 Tricyclics and metabolites     300 Opiates and metabolites        300 Cocaine and metabolites        300 THC                            50 Performed at [facility], [place] [address], [place], [place] [address]   Urinalysis, Routine w reflex microscopic     Status: None   Collection Time: 02/28/18  3:18 AM  Result Value Ref Range   Color, Urine YELLOW YELLOW   APPearance CLEAR CLEAR   Specific Gravity, Urine 1.024 1.005 - 1.030   pH 5.0 5.0 - 8.0   Glucose, UA NEGATIVE NEGATIVE mg/dL   Hgb urine dipstick NEGATIVE NEGATIVE   Bilirubin Urine NEGATIVE NEGATIVE   Ketones, ur NEGATIVE NEGATIVE mg/dL   Protein, ur NEGATIVE NEGATIVE mg/dL   Nitrite NEGATIVE NEGATIVE   Leukocytes,Ua NEGATIVE NEGATIVE    Comment: Performed at [facility], [place] [address] [place], [place] [address]   Ct Head Wo Contrast  Result Date: 02/28/2018 CLINICAL DATA:  Diplopia EXAM: CT HEAD WITHOUT CONTRAST TECHNIQUE: Contiguous axial images were obtained from the base of the skull through the vertex without intravenous contrast. COMPARISON:  Head CT 08/23/2014 FINDINGS: Brain: There is no mass, hemorrhage or extra-axial collection. The size and configuration of the ventricles and extra-axial CSF  spaces are normal. There is hypoattenuation of the white matter, most commonly indicating chronic small vessel disease. Vascular: No abnormal hyperdensity of the major intracranial arteries or dural venous sinuses. No intracranial atherosclerosis. Skull: The visualized skull base, calvarium and extracranial soft tissues are normal. Sinuses/Orbits: No fluid levels or advanced mucosal thickening of the visualized paranasal sinuses. No mastoid or middle ear effusion. The orbits are normal. IMPRESSION: Mild chronic small vessel disease without acute abnormality. Electronically Signed   By: Ulyses Jarred M.D.   On: 02/28/2018 05:30   Mr Brain Wo Contrast  Result Date: 02/28/2018 CLINICAL DATA:  Diplopia for 1 day. EXAM: MRI HEAD WITHOUT CONTRAST TECHNIQUE: Multiplanar, multiecho pulse sequences of the brain and surrounding structures were obtained without intravenous contrast. COMPARISON:  Head CT 02/28/2018 and MRI 08/24/2014 FINDINGS: Brain: There is no evidence of acute infarct, intracranial hemorrhage, mass, midline shift, or extra-axial fluid collection. The ventricles and sulci are within normal limits for age. Scattered small foci of T2 hyperintensity in the cerebral white matter are unchanged from the prior MRI and nonspecific but compatible with chronic small vessel ischemic disease, very mild for age. Vascular: Major intracranial vascular flow voids are preserved. Skull and upper cervical spine: Unremarkable bone  marrow signal. Sinuses/Orbits: Bilateral cataract extraction. Mild scattered mucosal thickening in the paranasal sinuses. Clear mastoid air cells. Other: None. IMPRESSION: 1. No acute intracranial abnormality. 2. Mild chronic small vessel ischemic disease. Electronically Signed   By: Logan Bores M.D.   On: 02/28/2018 10:07    Pending Labs Unresulted Labs (From admission, onward)    Start     Ordered   03/07/18 0500  Creatinine, serum  (enoxaparin (LOVENOX)    CrCl >/= 30 ml/min)  Weekly,   R     Comments:  while on enoxaparin therapy    02/28/18 1135   03/01/18 0500  Comprehensive metabolic panel  Tomorrow morning,   R     02/28/18 1135   03/01/18 0500  CBC  Tomorrow morning,   R     02/28/18 1135   02/28/18 1406  Lipid panel  Add-on,   R     02/28/18 1405   02/28/18 1406  Hemoglobin A1c  Add-on,   R     02/28/18 1405   02/28/18 1138  CBC  (enoxaparin (LOVENOX)    CrCl >/= 30 ml/min)  Add-on,   R    Comments:  Baseline for enoxaparin therapy IF NOT ALREADY DRAWN.  Notify MD if PLT < 100 K.    02/28/18 1137   02/28/18 1138  Creatinine, serum  (enoxaparin (LOVENOX)    CrCl >/= 30 ml/min)  Add-on,   R    Comments:  Baseline for enoxaparin therapy IF NOT ALREADY DRAWN.    02/28/18 1137   02/28/18 0631  Acetylcholine Receptor Ab, All  Once,   R     02/28/18 0639   02/28/18 0631  MuSK Antibodies  Once,   R     02/28/18 0639   02/28/18 0630  Striated muscle antibody  Once,   R     02/28/18 0639          Vitals/Pain Today's Vitals   02/28/18 1841 02/28/18 1900 02/28/18 1920 02/28/18 1941  BP: (!) 135/50 (!) 138/57 (!) 138/57   Pulse: 60 (!) 56 (!) 55   Resp: 16 15 16    Temp:      TempSrc:      SpO2: 92% 92% 93%   Weight:      Height:      PainSc:    0-No pain    Isolation Precautions No active isolations  Medications Medications  aspirin EC tablet 81 mg (81 mg Oral Given 02/28/18 1156)  atorvastatin (LIPITOR) tablet 20 mg (20 mg Oral Given 02/28/18 1155)  metoprolol succinate (TOPROL-XL) 24 hr tablet 50 mg (50 mg Oral Given 02/28/18 1155)  pantoprazole (PROTONIX) EC tablet 40 mg (40 mg Oral Given 02/28/18 1156)  oxybutynin (DITROPAN-XL) 24 hr tablet 5 mg (5 mg Oral Given 02/28/18 1615)  vitamin C (ASCORBIC ACID) tablet 125 mg (125 mg Oral Given 02/28/18 1155)  multivitamin with minerals tablet 1 tablet (1 tablet Oral Given 02/28/18 1154)  enoxaparin (LOVENOX) injection 40 mg (has no administration in time range)  acetaminophen (TYLENOL) tablet 650 mg (has no  administration in time range)    Or  acetaminophen (TYLENOL) suppository 650 mg (has no administration in time range)  polyethylene glycol (MIRALAX / GLYCOLAX) packet 17 g (has no administration in time range)  pyridostigmine (MESTINON) tablet 60 mg (60 mg Oral Given 02/28/18 1538)  predniSONE (DELTASONE) tablet 20 mg (20 mg Oral Given 02/28/18 1615)  pyridostigmine (MESTINON) tablet 30 mg (30 mg Oral Given 02/28/18 0718)  potassium chloride SA (K-DUR,KLOR-CON) CR tablet 40 mEq (40 mEq Oral Given 02/28/18 1839)    Mobility walks

## 2018-02-28 NOTE — ED Notes (Signed)
Patient given meal tray. Patient repositioned in bed. Call bell at bedside.

## 2018-02-28 NOTE — Consult Note (Addendum)
Neurology Consultation  Reason for Consult: Diplopia Referring Physician: Florene Glen  CC: Weakness and diplopia  History is obtained from: Patient  HPI: Travis Morris is a 78 y.o. male with past medical history of macular degeneration requiring surgery, hypertension, hypercholesterolemia, rotator cuff repair.  Patient states most of his symptoms occurred early this December.  He noted that during a party he was eating food and felt as though his lower lip was swelling and he had difficulty chewing and swallowing.  For this reason he started chopping up his food and fine pieces but noted that he would get tired after a while eating those fine pieces.  Later, during Christmas they had a steak dinner, and he noted that as he was chewing the steak his jaw would get very tired and as he was swallowing it become difficult for him to swallow.  He does acknowledge that he has become weaker when going up stairs especially at the end of the stairs and with repetitive motion he does get weaker.  The reason he came to the hospital at this time was secondary to the fact that while he was watching golf this past Sunday he noted after a while he was seeing skew deviation.  Currently he still has skew deviation.  MRI has been obtained and does not show any acute stroke.   ED course: CT head/MRI head/labs   LKW: 2200 hrs. on 02/27/2018 for his double vision  ROS: A 14 point ROS was performed and is negative except as noted in the HPI.   Past Medical History:  Diagnosis Date  . BPH with urinary obstruction   . GERD (gastroesophageal reflux disease)    ESOPHAGEAL REFLUX HX  . History of balanitis   . History of phimosis   . History of repair of rotator cuff   . Hx of biopsy    PENIS CUTANEOUS HX  . Hypercholesteremia   . Hypertension   . Macular hole    AIR FLUID EXCHANGE   . Organic impotence   . Prostate cancer [facility])     Family History  Problem Relation Age of Onset  . Dementia Father   . Colon  cancer Brother        mets to liver   . Liver cancer Brother   . Heart attack Brother        MI at age 1  . Healthy Son   . Healthy Daughter      Social History:   reports that he has never smoked. He has never used smokeless tobacco. He reports current alcohol use. He reports that he does not use drugs.  Medications  Current Facility-Administered Medications:  .  acetaminophen (TYLENOL) tablet 650 mg, 650 mg, Oral, Q6H PRN **OR** acetaminophen (TYLENOL) suppository 650 mg, 650 mg, Rectal, Q6H PRN, Elodia Florence., MD .  aspirin EC tablet 81 mg, 81 mg, Oral, Daily, Elodia Florence., MD, 81 mg at 02/28/18 1156 .  atorvastatin (LIPITOR) tablet 20 mg, 20 mg, Oral, Daily, Elodia Florence., MD, 20 mg at 02/28/18 1155 .  enoxaparin (LOVENOX) injection 40 mg, 40 mg, Subcutaneous, Q24H, Powell, A Clint Lipps., MD .  metoprolol succinate (TOPROL-XL) 24 hr tablet 50 mg, 50 mg, Oral, Daily, Elodia Florence., MD, 50 mg at 02/28/18 1155 .  multivitamin with minerals tablet 1 tablet, 1 tablet, Oral, Daily, Elodia Florence., MD, 1 tablet at 02/28/18 1154 .  oxybutynin (DITROPAN-XL) 24 hr tablet 5 mg, 5  mg, Oral, TID, Elodia Florence., MD, 5 mg at 02/28/18 1156 .  pantoprazole (PROTONIX) EC tablet 40 mg, 40 mg, Oral, Daily, Elodia Florence., MD, 40 mg at 02/28/18 1156 .  polyethylene glycol (MIRALAX / GLYCOLAX) packet 17 g, 17 g, Oral, Daily PRN, Elodia Florence., MD .  vitamin C (ASCORBIC ACID) tablet 125 mg, 125 mg, Oral, Daily, Elodia Florence., MD, 125 mg at 02/28/18 1155  Current Outpatient Medications:  .  Ascorbic Acid (VITAMIN C) 100 MG tablet, Take 100 mg by mouth daily., Disp: , Rfl:  .  aspirin 81 MG tablet, Take 81 mg by mouth daily.  , Disp: , Rfl:  .  atorvastatin (LIPITOR) 20 MG tablet, Take 20 mg by mouth daily., Disp: , Rfl:  .  losartan-hydrochlorothiazide (HYZAAR) 100-25 MG tablet, Take 1 tablet by mouth daily., Disp: 90  tablet, Rfl: 1 .  metoprolol succinate (TOPROL-XL) 50 MG 24 hr tablet, Take 1 tablet (50 mg total) by mouth daily. NEED OV., Disp: 15 tablet, Rfl: 0 .  Multiple Vitamin (MULTIVITAMINS PO), Take 1 tablet by mouth daily. CENTRUM SILVER , Disp: , Rfl:  .  omeprazole (PRILOSEC) 20 MG capsule, Take 20 mg by mouth daily.  , Disp: , Rfl:  .  oxybutynin (DITROPAN-XL) 5 MG 24 hr tablet, Take 5 mg by mouth 3 (three) times daily. , Disp: , Rfl:  .  vitamin E 400 UNIT capsule, Take 400 Units by mouth daily.  , Disp: , Rfl:    Exam: Current vital signs: BP 137/67   Pulse (!) 58   Temp 97.8 F (36.6 C)   Resp 18   Ht 5\' 8"  (1.727 m)   Wt 82.6 kg   SpO2 96%   BMI 27.67 kg/m  Vital signs in last 24 hours: Temp:  [97.5 F (36.4 C)-97.8 F (36.6 C)] 97.8 F (36.6 C) (02/12 0733) Pulse Rate:  [52-72] 58 (02/12 1330) Resp:  [16-24] 18 (02/12 1330) BP: (123-153)/(53-92) 137/67 (02/12 1330) SpO2:  [94 %-97 %] 96 % (02/12 1330) Weight:  [82.6 kg] 82.6 kg (02/11 2252)  Physical Exam  Constitutional: Appears well-developed and well-nourished.  Psych: Affect appropriate to situation Eyes: No scleral injection HENT: No OP obstrucion Head: Normocephalic.  Cardiovascular: Normal rate and regular rhythm.  Respiratory: Effort normal, non-labored breathing GI: Soft.  No distension. There is no tenderness.  Skin: WDI  Neuro: Mental Status: Patient is awake, alert, oriented to person, place, month, year, and situation. Patient is able to give a clear and coherent history. No signs of aphasia or neglect Cranial Nerves: II: Visual Fields are full.  III,IV, VI: EOMI without ptosis-with upward gaze for 20 seconds he did note that he started to have diplopia and started to see 2 of my pens.  Pupils are equal, round, and reactive to light.   V: Facial sensation is symmetric to temperature VII: Facial movement is symmetric.  VIII: hearing is intact to voice X: Uvula elevates symmetrically XI: Shoulder  shrug is symmetric. XII: tongue is midline without atrophy or fasciculations.  Motor: Tone is normal. Bulk is normal. 5/5 strength was present in all four extremities.-With fist pump it was noted that after approximately 20 seconds of continuously pumping his arm he was weaker on his right arm than prior. Sensory: Sensation is symmetric to light touch and temperature in the arms and legs. Deep Tendon Reflexes: Depressed throughout however he had a difficult time relaxing Plantars: Toes are downgoing bilaterally.  Cerebellar: FNF and HKS are intact bilaterally  Labs I have reviewed labs in epic and the results pertinent to this consultation are:   CBC    Component Value Date/Time   WBC 7.6 02/28/2018 0318   RBC 4.56 02/28/2018 0318   HGB 14.6 02/28/2018 0318   HCT 44.3 02/28/2018 0318   PLT 211 02/28/2018 0318   MCV 97.1 02/28/2018 0318   MCH 32.0 02/28/2018 0318   MCHC 33.0 02/28/2018 0318   RDW 11.5 02/28/2018 0318   LYMPHSABS 1.9 02/28/2018 0318   MONOABS 0.7 02/28/2018 0318   EOSABS 0.5 02/28/2018 0318   BASOSABS 0.0 02/28/2018 0318    CMP     Component Value Date/Time   NA 140 02/28/2018 0318   K 3.3 (L) 02/28/2018 0318   CL 102 02/28/2018 0318   CO2 30 02/28/2018 0318   GLUCOSE 113 (H) 02/28/2018 0318   BUN 22 02/28/2018 0318   CREATININE 0.92 02/28/2018 0318   CREATININE 1.06 08/20/2015 1334   CALCIUM 9.3 02/28/2018 0318   PROT 7.4 02/28/2018 0318   ALBUMIN 4.5 02/28/2018 0318   AST 24 02/28/2018 0318   ALT 29 02/28/2018 0318   ALKPHOS 62 02/28/2018 0318   BILITOT 0.6 02/28/2018 0318   GFRNONAA >60 02/28/2018 0318   GFRAA >60 02/28/2018 0318    Lipid Panel     Component Value Date/Time   CHOL 99 08/24/2014 0541   TRIG 54 08/24/2014 0541   HDL 24 (L) 08/24/2014 0541   CHOLHDL 4.1 08/24/2014 0541   VLDL 11 08/24/2014 0541   LDLCALC 64 08/24/2014 0541     Imaging I have reviewed the images obtained:  CT-scan of the brain-mild chronic small  vessel disease without acute abnormality  MRI examination of the brain- no acute intracranial abnormality with mild chronic small vessel ischemic disease  Etta Quill PA-C [facility] [phone]  M-F  (9:00 am- 5:00 PM)  02/28/2018, 2:19 PM     Assessment:  Given history and physical exam I do believe that this most likely is myasthenia gravis.  Apparently he was given a trial of 30 mg of Mestinon and did not feel any difference.  I do believe that we should try 60 mg with PT to follow and examined.  Unfortunately patient is at [facility] and apparently there is 13 beds still waiting to be filled.  If need be will give order to give medication and come over to see patient approximately 2 hours later  Impression:   Suspect Myasthenia Gravis D/D motor neuron disease, myopathies   Recommendations: - Started prednisone 20mg  daily, increase to 30mg  on week 2 and 40mg  daily on week3 and continue at 40mg  daily  - continue PPI  - Start patient on Calcium supplement  - Continue Mestinon 60mg  TID, reduce dose to 30mg  TID if patient develops diarrhea - F/U ACH antibodies, Anti MUSK antibodies - Avoid Beta blockers, flouroquinolones, macrolides - Needs EMG - single fiber, can also help r/o motor neuron disease     NEUROHOSPITALIST ADDENDUM Performed a face to face diagnostic evaluation.   I have reviewed the contents of history and physical exam as documented by PA/ARNP/Resident and agree with above documentation.  I have discussed and formulated the above plan as documented. Edits to the note have been made as needed.  Patient with several months history of progressive dysarthria and dysphasia, worse in the evening presents with double vision that began on Tuesday.  MRI brain was performed which was negative for  any acute infarct.  Extraocular movements intact on examination.  Patient has some fatigability to prolonged vertical gaze.  Speech is dysarthric.  No facial droop,  tongue is midline.  Motor strength 5 out of 5 in all 4 extremities, no significant fatigability.  Single breath count greater than 20.  Neck flexor strength is good.  Reflexes 2+ bilateral patella 1+ bilateral ankle.  1+ bilateral biceps.  No tongue fasciculations, fasciculations noted in any of the muscles throughout his body.  Jaw jerk absent.   Given his history and I think myasthenia gravis is most likely, however motor neuron disease cannot be ruled out.  Patient definitely needs an EMG nerve conduction study however it appears to be clinically stable I think this can be done as outpatient.  He has passed his swallow evaluation and has a diet.  There is been no significant improvement after giving Mestinon and steroids however not significantly worse either.  Recommendations As noted above in the note Patient can be discharged home to follow-up with outpatient neurology.  He has an appointment scheduled Dr. Alda Lea however this is in April and should be ideally seen earlier. Counseled upon symptoms of myasthenic exacerbation crisis and instructed patient to go to the emergency room if he were to experience any of those symptoms. (Shortness of breath, severe fatigability, worsening double vision, worsening dysphagia and dysarthria, inability to hold neck upright)        Karena Addison  MD [facility] [phone]   If 7pm to 7am, please call on call as listed on AMION.

## 2018-02-28 NOTE — ED Notes (Signed)
EKG given to Dr. Shellee Milo for review

## 2018-02-28 NOTE — Progress Notes (Signed)
Pt in MRI-RT will attempt NIF/VC at a later time.

## 2018-02-28 NOTE — ED Notes (Signed)
Paged Powell,MD about patient still having double vision that has not gotten better despite medication. Patient is comfortable at this time. Will continue to monitor and wait for orders.

## 2018-02-28 NOTE — Progress Notes (Signed)
NIF -40 VC 2.76  Patient gave good effort.

## 2018-03-01 DIAGNOSIS — H532 Diplopia: Secondary | ICD-10-CM

## 2018-03-01 LAB — CBC
HCT: 42 % (ref 39.0–52.0)
Hemoglobin: 13.9 g/dL (ref 13.0–17.0)
MCH: 32.1 pg (ref 26.0–34.0)
MCHC: 33.1 g/dL (ref 30.0–36.0)
MCV: 97 fL (ref 80.0–100.0)
NRBC: 0 % (ref 0.0–0.2)
Platelets: 200 10*3/uL (ref 150–400)
RBC: 4.33 MIL/uL (ref 4.22–5.81)
RDW: 11.3 % — ABNORMAL LOW (ref 11.5–15.5)
WBC: 9 10*3/uL (ref 4.0–10.5)

## 2018-03-01 LAB — LIPID PANEL
Cholesterol: 115 mg/dL (ref 0–200)
HDL: 30 mg/dL — ABNORMAL LOW (ref >40)
LDL Cholesterol: 73 mg/dL (ref 0–99)
Total CHOL/HDL Ratio: 3.8 RATIO
Triglycerides: 62 mg/dL (ref <150)
VLDL: 12 mg/dL (ref 0–40)

## 2018-03-01 LAB — COMPREHENSIVE METABOLIC PANEL
ALT: 26 U/L (ref 0–44)
ANION GAP: 8 (ref 5–15)
AST: 26 U/L (ref 15–41)
Albumin: 3.7 g/dL (ref 3.5–5.0)
Alkaline Phosphatase: 55 U/L (ref 38–126)
BUN: 17 mg/dL (ref 8–23)
CO2: 25 mmol/L (ref 22–32)
Calcium: 8.9 mg/dL (ref 8.9–10.3)
Chloride: 106 mmol/L (ref 98–111)
Creatinine, Ser: 0.86 mg/dL (ref 0.61–1.24)
GFR calc Af Amer: >60 mL/min (ref >60)
GFR calc non Af Amer: >60 mL/min (ref >60)
Glucose, Bld: 131 mg/dL — ABNORMAL HIGH (ref 70–99)
Potassium: 4.3 mmol/L (ref 3.5–5.1)
Sodium: 139 mmol/L (ref 135–145)
Total Bilirubin: 1.1 mg/dL (ref 0.3–1.2)
Total Protein: 6.2 g/dL — ABNORMAL LOW (ref 6.5–8.1)

## 2018-03-01 LAB — CK: Total CK: 103 U/L (ref 49–397)

## 2018-03-01 MED ORDER — PREDNISONE 10 MG PO TABS: ORAL_TABLET | ORAL | 0 refills | Status: DC

## 2018-03-01 MED ORDER — PYRIDOSTIGMINE BROMIDE 60 MG PO TABS: 60.0000 mg | ORAL_TABLET | Freq: Three times a day (TID) | ORAL | 0 refills | Status: DC

## 2018-03-01 NOTE — Progress Notes (Signed)
Please see addendum on consult note from yesterday.  Patient is clinically stable and not in myasthenic crisis.  Appears to have tolerated steroids well.  I think he can be discharged with close outpatient neurology follow-up and he definitely needs a EMG.

## 2018-03-01 NOTE — Evaluation (Signed)
Occupational Therapy Evaluation Patient Details Name: Travis Morris MRN: [id number] DOB: 03/22/1940 Today's Date: 03/01/2018    History of Present Illness 78 YO admitted 02/28/18 with diplpia, difficulty swallowing over time. Working diagmnosis of myasthenia gravis.    Clinical Impression   Pt was admitted for the above.  At baseline, he was independent but developed diplopia just prior to admission.  Pt also c/o shoulder pain when self feeding. Will follow in acute setting with independent level goals.  He needs mostly min guard for safety when walking and set up./supervision for adls    Follow Up Recommendations  Supervision/Assistance - 24 hour(? OP OT)    Equipment Recommendations  (likely none)    Recommendations for Other Services       Precautions / Restrictions Precautions Precaution Comments: diplopia Restrictions Weight Bearing Restrictions: No      Mobility Bed Mobility Overal bed mobility: Independent             General bed mobility comments: by PT  Transfers Overall transfer level: Needs assistance Equipment used: None Transfers: Sit to/from Stand Sit to Stand: Min guard         General transfer comment: for safety    Balance Overall balance assessment: No apparent balance deficits (not formally assessed)                                         ADL either performed or assessed with clinical judgement   ADL Overall ADL's : Needs assistance/impaired Eating/Feeding: Independent   Grooming: Wash/dry hands;Modified independent;Standing                   Toilet Transfer: Min guard;Ambulation;Comfort height toilet Toilet Transfer Details (indicate cue type and reason): for safety           General ADL Comments: Pt can perform ADLs with setup/supervison sit to stand. Crosses legs to reach feet     Vision Patient Visual Report: Diplopia(new; vertical misalignment)       Perception     Praxis       Pertinent Vitals/Pain Pain Assessment: No/denies pain     Hand Dominance     Extremity/Trunk Assessment Upper Extremity Assessment Upper Extremity Assessment: RUE deficits/detail RUE Deficits / Details: rom Alexander but pt c/o radiating shoulder pain after feeding himself; this doesn't happen when at computer.  ? if he is supporting weight of arm vs resting forearm when using computer.  Educated to try to rest forearms during meals   Lower Extremity Assessment per PT Lower Extremity Assessment: RLE deficits/detail;LLE deficits/detail RLE Sensation: history of peripheral neuropathy LLE Sensation: history of peripheral neuropathy   Cervical / Trunk Assessment Cervical / Trunk Assessment: Normal   Communication Communication Communication: No difficulties   Cognition Arousal/Alertness: Awake/alert Behavior During Therapy: WFL for tasks assessed/performed;Impulsive Overall Cognitive Status: Within Functional Limits for tasks assessed                                     General Comments       Exercises     Shoulder Instructions      Home Living Family/patient expects to be discharged to:: Private residence Living Arrangements: Spouse/significant other Available Help at Discharge: Family Type of Home: House Home Access: Stairs to enter [employer] of Steps: 2 Entrance Stairs-Rails: None  Home Layout: Two level Alternate Level Stairs-Number of Steps: 13 Alternate Level Stairs-Rails: Right;Left           Home Equipment: None          Prior Functioning/Environment Level of Independence: Independent        Comments: driving. Pt is a Engineer, maintenance (IT); consults        OT Problem List: Decreased strength;Impaired vision/perception      OT Treatment/Interventions: Self-care/ADL training;DME and/or AE instruction;Patient/family education;Therapeutic activities    OT Goals(Current goals can be found in the care plan section) Acute Rehab OT  Goals Patient Stated Goal: to get back to doing  things that I do OT Goal Formulation: With patient Time For Goal Achievement: 03/15/18 Potential to Achieve Goals: Good ADL Goals Additional ADL Goal #1: pt will be independent with adls, bathroom transfers Additional ADL Goal #2: pt will be independent with use of patch or occluded glasses intermittently and use closed chain posture during adls as needed  OT Frequency: Min 2X/week   Barriers to D/C:            Co-evaluation              AM-PAC OT "6 Clicks" Daily Activity     Outcome Measure Help from another person eating meals?: None Help from another person taking care of personal grooming?: A Little Help from another person toileting, which includes using toliet, bedpan, or urinal?: A Little Help from another person bathing (including washing, rinsing, drying)?: A Little Help from another person to put on and taking off regular upper body clothing?: A Little Help from another person to put on and taking off regular lower body clothing?: A Little 6 Click Score: 19   End of Session    Activity Tolerance: Patient tolerated treatment well Patient left: in chair;with call bell/phone within reach;with chair alarm set  OT Visit Diagnosis: Muscle weakness (generalized) (M62.81)                Time: 9794-8016 OT Time Calculation (min): 21 min Charges:  OT General Charges $OT Visit: 1 Visit OT Evaluation $OT Eval Low Complexity: 1 Low  Lesle Chris, OTR/L Acute Rehabilitation Services [phone] [facility] pager [phone] office 03/01/2018  [place] 03/01/2018, 12:54 PM

## 2018-03-01 NOTE — Discharge Summary (Signed)
Physician Discharge Summary  Travis Morris IRC:[id number] DOB: 14-Dec-1940 DOA: 02/28/2018  PCP: London Pepper, MD  Admit date: 02/28/2018 Discharge date: 03/01/2018  Admitted From: Home  Discharge disposition: Home   Recommendations for Outpatient Follow-Up:   Days follow-up up with Dr. Alda Lea neurology preferably within 1 to 2 weeks.  Your medication need to be adjusted by neurology.  If you have worsening symptoms, please seek medical attention  Discharge Diagnosis:   Active Problems:   Diplopia suspected myasthenia gravis   Discharge Condition: Stable  Diet recommendation: Low sodium, heart healthy.   Wound care: None.  Code status: Full.   History of Present Illness:   Travis A Wallaceis a 78 y.o.malewith medical history significant ofessential tremor, reflux, prostate cancer, hypertension who presented with diplopia after weeks of dysphasia and dysphonia,concerning for myasthenia gravis. Patient notes that he has had double vision and while he was driving he saw 2 cars and while he was walking down the stairs he thought 2 sets of steps. He presented to the emergency department because of his double vision. He is noticed swallowing problems since December. He notes these primarily with solids. He also has noted some difficulty speaking as well. He notes that his symptoms seem to be better in the morning. He notes that his after eating when his speech gets bad.   His symptoms were concerning of myasthenia gravis and the patient was admitted to the hospital for further evaluation and treatment.  Hospital Course:   Diplopia, trouble swallowing and speech.  Likely myasthenia gravis Patient denied focal body weakness.  Patient was started on pyridostigmine and prednisone.   Physical therapy recommended no skilled therapy needs.  CT head scan/MRI of the brain without any acute findings.  Neurology has seen the patient in consultation and follow-up and recommend  continuation of prednisone and Mestinon as outpatient.  Patient will need to follow-up with Dr. Alda Lea, Neurology as outpatient to adjust his medications.  Patient was advised to seek medical attention for worsening symptoms.  History of essential hypertension.    Any outpatient medication.  Hyperlipidemia continue statins.  Overactive bladder.  Continue oxybutynin   Hypokalemia improved with replacement.  Disposition.  At this time, patient has been considered stable for disposition home.  Patient will have to follow-up with neurology as outpatient.    Medical Consultants:    Neurology   Discharge Exam:   Vitals:   03/01/18 1113 03/01/18 1117  BP:  (!) 150/71  Pulse:  69  Resp:  (!) 23  Temp: 98.2 F (36.8 C)   SpO2:  (!) 89%   Vitals:   03/01/18 1006 03/01/18 1100 03/01/18 1113 03/01/18 1117  BP: (!) 157/46 (!) 167/50  (!) 150/71  Pulse: 68 63  69  Resp: 16 (!) 30  (!) 23  Temp:   98.2 F (36.8 C)   TempSrc:   Oral   SpO2: 94% 91%  (!) 89%  Weight:      Height:       GENERAL: Patient is alert awake and oriented. Not in obvious distress. HENT: No scleral pallor or icterus. Pupils equally reactive to light. Oral mucosa is moist NECK: is supple, no palpable thyroid enlargement. CHEST: Clear to auscultation. No crackles or wheezes. Non tender on palpation. Diminished breath sounds bilaterally. CVS: S1 and S2 heard, no murmur. Regular rate and rhythm. No pericardial rub. ABDOMEN: Soft, non-tender, bowel sounds are present. No palpable hepato-splenomegaly. EXTREMITIES: No edema. CNS: Cranial nerves are intact. No focal  motor or sensory deficits. SKIN: warm and dry without rashes.  The results of significant diagnostics from this hospitalization (including imaging, microbiology, ancillary and laboratory) are listed below for reference.     Procedures and Diagnostic Studies:   Ct Head Wo Contrast  Result Date: 02/28/2018 CLINICAL DATA:  Diplopia EXAM: CT HEAD  WITHOUT CONTRAST TECHNIQUE: Contiguous axial images were obtained from the base of the skull through the vertex without intravenous contrast. COMPARISON:  Head CT 08/23/2014 FINDINGS: Brain: There is no mass, hemorrhage or extra-axial collection. The size and configuration of the ventricles and extra-axial CSF spaces are normal. There is hypoattenuation of the white matter, most commonly indicating chronic small vessel disease. Vascular: No abnormal hyperdensity of the major intracranial arteries or dural venous sinuses. No intracranial atherosclerosis. Skull: The visualized skull base, calvarium and extracranial soft tissues are normal. Sinuses/Orbits: No fluid levels or advanced mucosal thickening of the visualized paranasal sinuses. No mastoid or middle ear effusion. The orbits are normal. IMPRESSION: Mild chronic small vessel disease without acute abnormality. Electronically Signed   By: Ulyses Jarred M.D.   On: 02/28/2018 05:30   Mr Brain Wo Contrast  Result Date: 02/28/2018 CLINICAL DATA:  Diplopia for 1 day. EXAM: MRI HEAD WITHOUT CONTRAST TECHNIQUE: Multiplanar, multiecho pulse sequences of the brain and surrounding structures were obtained without intravenous contrast. COMPARISON:  Head CT 02/28/2018 and MRI 08/24/2014 FINDINGS: Brain: There is no evidence of acute infarct, intracranial hemorrhage, mass, midline shift, or extra-axial fluid collection. The ventricles and sulci are within normal limits for age. Scattered small foci of T2 hyperintensity in the cerebral white matter are unchanged from the prior MRI and nonspecific but compatible with chronic small vessel ischemic disease, very mild for age. Vascular: Major intracranial vascular flow voids are preserved. Skull and upper cervical spine: Unremarkable bone marrow signal. Sinuses/Orbits: Bilateral cataract extraction. Mild scattered mucosal thickening in the paranasal sinuses. Clear mastoid air cells. Other: None. IMPRESSION: 1. No acute  intracranial abnormality. 2. Mild chronic small vessel ischemic disease. Electronically Signed   By: Logan Bores M.D.   On: 02/28/2018 10:07     Labs:   Basic Metabolic Panel: Recent Labs  Lab 02/28/18 0318 02/28/18 2054 03/01/18 0257  NA 140  --  139  K 3.3*  --  4.3  CL 102  --  106  CO2 30  --  25  GLUCOSE 113*  --  131*  BUN 22  --  17  CREATININE 0.92 0.94 0.86  CALCIUM 9.3  --  8.9   GFR Estimated Creatinine Clearance: 74.4 mL/min (by C-G formula based on SCr of 0.86 mg/dL). Liver Function Tests: Recent Labs  Lab 02/28/18 0318 03/01/18 0257  AST 24 26  ALT 29 26  ALKPHOS 62 55  BILITOT 0.6 1.1  PROT 7.4 6.2*  ALBUMIN 4.5 3.7   No results for input(s): LIPASE, AMYLASE in the last 168 hours. No results for input(s): AMMONIA in the last 168 hours. Coagulation profile Recent Labs  Lab 02/28/18 0318  INR 1.01    CBC: Recent Labs  Lab 02/28/18 0318 02/28/18 2054 03/01/18 0257  WBC 7.6 9.7 9.0  NEUTROABS 4.5  --   --   HGB 14.6 14.5 13.9  HCT 44.3 43.8 42.0  MCV 97.1 96.3 97.0  PLT 211 173 200   Cardiac Enzymes: No results for input(s): CKTOTAL, CKMB, CKMBINDEX, TROPONINI in the last 168 hours. BNP: Invalid input(s): POCBNP CBG: No results for input(s): GLUCAP in the last 168 hours.  D-Dimer No results for input(s): DDIMER in the last 72 hours. Hgb A1c No results for input(s): HGBA1C in the last 72 hours. Lipid Profile Recent Labs    02/28/18 2054  CHOL 115  HDL 30*  LDLCALC 73  TRIG 62  CHOLHDL 3.8   Thyroid function studies No results for input(s): TSH, T4TOTAL, T3FREE, THYROIDAB in the last 72 hours.  Invalid input(s): FREET3 Anemia work up No results for input(s): VITAMINB12, FOLATE, FERRITIN, TIBC, IRON, RETICCTPCT in the last 72 hours. Microbiology Recent Results (from the past 240 hour(s))  MRSA PCR Screening     Status: None   Collection Time: 02/28/18  8:53 PM  Result Value Ref Range Status   MRSA by PCR NEGATIVE NEGATIVE  Final    Comment:        The GeneXpert MRSA Assay (FDA approved for NASAL specimens only), is one component of a comprehensive MRSA colonization surveillance program. It is not intended to diagnose MRSA infection nor to guide or monitor treatment for MRSA infections. Performed at [facility], [place] [address], [place], [place] [address]      Discharge Instructions:   Discharge Instructions    Diet - low sodium heart healthy   Complete by:  As directed    Discharge instructions   Complete by:  As directed    Please follow-up with neurology Dr. Alda Lea if possible within 1 to 2 weeks.  Your medications will need to be adjusted by the neurologist.  Please seek medical attention for worsening symptoms like shortness of breath, severe fatigability, worsening double vision, worsening speech or swallowing and, inability to hold neck upright.   Increase activity slowly   Complete by:  As directed      Allergies as of 03/01/2018   No Known Allergies     Medication List    TAKE these medications   aspirin 81 MG tablet Take 81 mg by mouth daily.   atorvastatin 20 MG tablet Commonly known as:  LIPITOR Take 20 mg by mouth daily.   losartan-hydrochlorothiazide 100-25 MG tablet Commonly known as:  HYZAAR Take 1 tablet by mouth daily.   metoprolol succinate 50 MG 24 hr tablet Commonly known as:  TOPROL-XL Take 1 tablet (50 mg total) by mouth daily. NEED OV.   MULTIVITAMINS PO Take 1 tablet by mouth daily. CENTRUM SILVER   omeprazole 20 MG capsule Commonly known as:  PRILOSEC Take 20 mg by mouth daily.   oxybutynin 5 MG 24 hr tablet Commonly known as:  DITROPAN-XL Take 5 mg by mouth 3 (three) times daily.   predniSONE 10 MG tablet Commonly known as:  DELTASONE 20 mg po Q daily x 6 days then 30 mg PO Q daily for 7 days then 40 mg Po Q daily for one month   pyridostigmine 60 MG tablet Commonly known as:  MESTINON Take 1 tablet (60 mg total) by mouth 3  (three) times daily.   vitamin C 100 MG tablet Take 100 mg by mouth daily.   vitamin E 400 UNIT capsule Take 400 Units by mouth daily.         Time coordinating discharge: 39 minutes  Signed:  Decorian Schuenemann  [facility] 03/01/2018, 2:21 PM

## 2018-03-01 NOTE — Progress Notes (Signed)
NIF -40 FVC 2.15 good pt effort

## 2018-03-01 NOTE — Progress Notes (Signed)
PROGRESS NOTE  Travis Morris ZOX:[id number] DOB: 1941-01-08 DOA: 02/28/2018 PCP: London Pepper, MD   LOS: 1 day   Brief narrative:   Travis Morris is a 78 y.o. male with medical history significant of essential tremor, reflux, prostate cancer, hypertension who presented with diplopia after weeks of dysphasia and dysphonia, concerning for myasthenia gravis. Patient notes that he has had double vision and while he was driving he saw 2 cars and while he was walking down the stairs he thought 2 sets of steps.  He presented to the emergency department because of his double vision.  He is noticed swallowing problems since December.  He notes these primarily with solids.  He also has noted some difficulty speaking as well.  He notes that his symptoms seem to be better in the morning.  He notes that his after eating when his speech gets bad.    His symptoms were concerning of myasthenia gravis and the patient was admitted to the hospital for further evaluation and treatment.  Assessment/Plan:  Active Problems:   Diplopia  Diplopia, trouble swallowing and speech.  Likely myasthenia gravis.  Neurology has seen the patient.  Continue negative inspiratory flow and vital capacity monitoring in the stepdown unit.  Will follow neurology recommendation.  Patient denies focal body weakness.  Patient has been started on pyridostigmine and prednisone.  Follow response.  Physical therapy recommended no skilled therapy needs.  CT head scan/MRI of the brain without any acute findings.  History of essential hypertension.  Continue metoprolol.  Holding losartan HCTZ.  Hyperlipidemia continue statins.  Overactive bladder.  Continue oxybutynin  Hypokalemia improved.  VTE Prophylaxis: Lovenox  Code Status: DNR  Family Communication: No one available at bedside  Disposition Plan:   Home in 1 to 2 daysFollow-up neurology recommendation.   Consultants:  Neurology  Procedures:  CT head scan, MRI  brain  Antibiotics: Anti-infectives (From admission, onward)   None       Subjective: Patient complains of double vision and mild dysarthria.  Denies any focal body weakness.  Objective: Vitals:   03/01/18 0500 03/01/18 0600  BP: (!) 118/50 (!) 136/47  Pulse: (!) 45 (!) 47  Resp: 20 18  Temp:    SpO2: 91% 93%    Intake/Output Summary (Last 24 hours) at 03/01/2018 8119 Last data filed at 03/01/2018 0500 Gross per 24 hour  Intake -  Output 600 ml  Net -600 ml   Filed Weights   02/27/18 2252 02/28/18 2045  Weight: 82.6 kg 86.1 kg   Body mass index is 28.86 kg/m.   Physical Exam: GENERAL: Patient is alert awake and oriented. Not in obvious distress. HENT: No scleral pallor or icterus. Pupils equally reactive to light. Oral mucosa is moist NECK: is supple, no palpable thyroid enlargement. CHEST: Clear to auscultation. No crackles or wheezes. Non tender on palpation. Diminished breath sounds bilaterally. CVS: S1 and S2 heard, no murmur. Regular rate and rhythm. No pericardial rub. ABDOMEN: Soft, non-tender, bowel sounds are present. No palpable hepato-splenomegaly. EXTREMITIES: No edema. CNS: Cranial nerves are intact. No focal motor or sensory deficits. SKIN: warm and dry without rashes.  Data Review: I have personally reviewed the following laboratory data and studies,  CBC: Recent Labs  Lab 02/28/18 0318 02/28/18 2054 03/01/18 0257  WBC 7.6 9.7 9.0  NEUTROABS 4.5  --   --   HGB 14.6 14.5 13.9  HCT 44.3 43.8 42.0  MCV 97.1 96.3 97.0  PLT 211 173 200   Basic  Metabolic Panel: Recent Labs  Lab 02/28/18 0318 02/28/18 2054 03/01/18 0257  NA 140  --  139  K 3.3*  --  4.3  CL 102  --  106  CO2 30  --  25  GLUCOSE 113*  --  131*  BUN 22  --  17  CREATININE 0.92 0.94 0.86  CALCIUM 9.3  --  8.9   Liver Function Tests: Recent Labs  Lab 02/28/18 0318 03/01/18 0257  AST 24 26  ALT 29 26  ALKPHOS 62 55  BILITOT 0.6 1.1  PROT 7.4 6.2*  ALBUMIN 4.5  3.7   No results for input(s): LIPASE, AMYLASE in the last 168 hours. No results for input(s): AMMONIA in the last 168 hours. Cardiac Enzymes: No results for input(s): CKTOTAL, CKMB, CKMBINDEX, TROPONINI in the last 168 hours. BNP (last 3 results) No results for input(s): BNP in the last 8760 hours.  ProBNP (last 3 results) No results for input(s): PROBNP in the last 8760 hours.  CBG: No results for input(s): GLUCAP in the last 168 hours. Recent Results (from the past 240 hour(s))  MRSA PCR Screening     Status: None   Collection Time: 02/28/18  8:53 PM  Result Value Ref Range Status   MRSA by PCR NEGATIVE NEGATIVE Final    Comment:        The GeneXpert MRSA Assay (FDA approved for NASAL specimens only), is one component of a comprehensive MRSA colonization surveillance program. It is not intended to diagnose MRSA infection nor to guide or monitor treatment for MRSA infections. Performed at [facility], [place] [address] [place], [place] [address]      Studies: Ct Head Wo Contrast  Result Date: 02/28/2018 CLINICAL DATA:  Diplopia EXAM: CT HEAD WITHOUT CONTRAST TECHNIQUE: Contiguous axial images were obtained from the base of the skull through the vertex without intravenous contrast. COMPARISON:  Head CT 08/23/2014 FINDINGS: Brain: There is no mass, hemorrhage or extra-axial collection. The size and configuration of the ventricles and extra-axial CSF spaces are normal. There is hypoattenuation of the white matter, most commonly indicating chronic small vessel disease. Vascular: No abnormal hyperdensity of the major intracranial arteries or dural venous sinuses. No intracranial atherosclerosis. Skull: The visualized skull base, calvarium and extracranial soft tissues are normal. Sinuses/Orbits: No fluid levels or advanced mucosal thickening of the visualized paranasal sinuses. No mastoid or middle ear effusion. The orbits are normal. IMPRESSION: Mild chronic small  vessel disease without acute abnormality. Electronically Signed   By: Ulyses Jarred M.D.   On: 02/28/2018 05:30   Mr Brain Wo Contrast  Result Date: 02/28/2018 CLINICAL DATA:  Diplopia for 1 day. EXAM: MRI HEAD WITHOUT CONTRAST TECHNIQUE: Multiplanar, multiecho pulse sequences of the brain and surrounding structures were obtained without intravenous contrast. COMPARISON:  Head CT 02/28/2018 and MRI 08/24/2014 FINDINGS: Brain: There is no evidence of acute infarct, intracranial hemorrhage, mass, midline shift, or extra-axial fluid collection. The ventricles and sulci are within normal limits for age. Scattered small foci of T2 hyperintensity in the cerebral white matter are unchanged from the prior MRI and nonspecific but compatible with chronic small vessel ischemic disease, very mild for age. Vascular: Major intracranial vascular flow voids are preserved. Skull and upper cervical spine: Unremarkable bone marrow signal. Sinuses/Orbits: Bilateral cataract extraction. Mild scattered mucosal thickening in the paranasal sinuses. Clear mastoid air cells. Other: None. IMPRESSION: 1. No acute intracranial abnormality. 2. Mild chronic small vessel ischemic disease. Electronically Signed   By: Zenia Resides  Jeralyn Ruths M.D.   On: 02/28/2018 10:07    Scheduled Meds: . aspirin EC  81 mg Oral Daily  . atorvastatin  20 mg Oral Daily  . enoxaparin (LOVENOX) injection  40 mg Subcutaneous Q24H  . metoprolol succinate  50 mg Oral Daily  . multivitamin with minerals  1 tablet Oral Daily  . oxybutynin  5 mg Oral TID  . pantoprazole  40 mg Oral Daily  . predniSONE  20 mg Oral Q breakfast  . pyridostigmine  60 mg Oral Q8H  . vitamin C  125 mg Oral Daily    Continuous Infusions:   Flora Lipps, MD  [facility] 03/01/2018

## 2018-03-01 NOTE — Evaluation (Signed)
Physical Therapy Evaluation Patient Details Name: Travis Morris MRN: [id number] DOB: 08-21-1940 Today's Date: 03/01/2018   History of Present Illness  78 YO admitted 02/28/18 with diplpia, difficulty swallowing ove r time. Working diagmnosis of myasthenia gravis.   Clinical Impression  The patient is requiring no assistance  For mobility on levels. Patient has 15 steps to second floor so will practice prior to DC to determine safety with double vision.Patient rep[orts double vision currently. Continue PT in acute care. Do not anticipate any PT will be required at DC    Follow Up Recommendations No PT follow up    Equipment Recommendations  None recommended by PT    Recommendations for Other Services       Precautions / Restrictions Precautions Precaution Comments: diplopia      Mobility  Bed Mobility Overal bed mobility: Independent                Transfers Overall transfer level: Needs assistance Equipment used: None Transfers: Sit to/from Stand Sit to Stand: Min guard         General transfer comment: cues for safety as patient is impulsive  Ambulation/Gait Ambulation/Gait assistance: Min guard Gait Distance (Feet): 440 Feet Assistive device: None Gait Pattern/deviations: WFL(Within Functional Limits);Drifts right/left     General Gait Details: no balance  loss  Stairs            Wheelchair Mobility    Modified Rankin (Stroke Patients Only)       Balance Overall balance assessment: No apparent balance deficits (not formally assessed)                                           Pertinent Vitals/Pain Pain Assessment: No/denies pain    Home Living Family/patient expects to be discharged to:: Private residence Living Arrangements: Spouse/significant other Available Help at Discharge: Family Type of Home: House Home Access: Stairs to enter Entrance Stairs-Rails: None Technical brewer of Steps: 2 Home Layout: Two  level Home Equipment: None      Prior Function Level of Independence: Independent         Comments: driving     Hand Dominance        Extremity/Trunk Assessment        Lower Extremity Assessment Lower Extremity Assessment: RLE deficits/detail;LLE deficits/detail RLE Sensation: history of peripheral neuropathy LLE Sensation: history of peripheral neuropathy    Cervical / Trunk Assessment Cervical / Trunk Assessment: Normal  Communication   Communication: No difficulties  Cognition Arousal/Alertness: Awake/alert Behavior During Therapy: WFL for tasks assessed/performed;Impulsive Overall Cognitive Status: Within Functional Limits for tasks assessed                                        General Comments      Exercises     Assessment/Plan    PT Assessment Patient needs continued PT services  PT Problem List Decreased mobility       PT Treatment Interventions Gait training;Stair training;Functional mobility training    PT Goals (Current goals can be found in the Care Plan section)  Acute Rehab PT Goals Patient Stated Goal: to get back to doing  things that I do PT Goal Formulation: With patient Time For Goal Achievement: 03/08/18 Potential to Achieve Goals: Good    Frequency  Min 2X/week   Barriers to discharge        Co-evaluation               AM-PAC PT "6 Clicks" Mobility  Outcome Measure Help needed turning from your back to your side while in a flat bed without using bedrails?: None Help needed moving from lying on your back to sitting on the side of a flat bed without using bedrails?: None Help needed moving to and from a bed to a chair (including a wheelchair)?: A Little Help needed standing up from a chair using your arms (e.g., wheelchair or bedside chair)?: A Little Help needed to walk in hospital room?: A Little Help needed climbing 3-5 steps with a railing? : A Lot 6 Click Score: 19    End of Session Equipment  Utilized During Treatment: Gait belt Activity Tolerance: Patient tolerated treatment well Patient left: (in BR w/ OT) Nurse Communication: Mobility status PT Visit Diagnosis: Other symptoms and signs involving the nervous system (Z60.109)    Time: 3235-5732 PT Time Calculation (min) (ACUTE ONLY): 16 min   Charges:   PT Evaluation $PT Eval Low Complexity: [facility] [place] PT Acute Rehabilitation Services Pager [phone] Office [phone]   Claretha Cooper 03/01/2018, 11:49 AM

## 2018-03-02 ENCOUNTER — Telehealth: Payer: Self-pay | Admitting: Neurology

## 2018-03-02 ENCOUNTER — Ambulatory Visit: Payer: Medicare Other

## 2018-03-02 LAB — HEMOGLOBIN A1C
Hgb A1c MFr Bld: 6.3 % — ABNORMAL HIGH (ref 4.8–5.6)
Mean Plasma Glucose: 134 mg/dL

## 2018-03-02 LAB — STRIATED MUSCLE ANTIBODY: Anti-striation Abs: 1:40 {titer} — ABNORMAL HIGH

## 2018-03-02 NOTE — Telephone Encounter (Signed)
Leon.

## 2018-03-02 NOTE — Telephone Encounter (Signed)
Dr. Posey Pronto had sent a message to front desk to schedule with her on 2/24 at 11:10am with Dr. Posey Pronto for NP slot. Thanks

## 2018-03-02 NOTE — Telephone Encounter (Signed)
Patient last seen in May 2019 for tremor. It looks like he was seen in the hospital for possible Myasthenia Gravis. He was told to follow up in our clinic. Dr. Carles Collet has never seen him for this diagnosis. Please advise.

## 2018-03-02 NOTE — Telephone Encounter (Signed)
Patient called about being discharged from hospital today and needing to follow up within a week. Is there anywhere that I can be approved to work him into the schedule? I have him on the waitlist high priority for his April appt. I didn't know if he needed to be seen ASAP and worked in. Thanks!

## 2018-03-02 NOTE — Telephone Encounter (Signed)
We were unaware that there was a scheduling msg from [facility]. The patient is now scheduled for 2/21 at 8:50AM. He was unable to come on 2/24. Thanks yall!

## 2018-03-06 ENCOUNTER — Inpatient Hospital Stay (HOSPITAL_COMMUNITY): Payer: Medicare Other

## 2018-03-06 ENCOUNTER — Emergency Department (HOSPITAL_COMMUNITY): Payer: Medicare Other

## 2018-03-06 ENCOUNTER — Ambulatory Visit: Payer: Medicare Other | Admitting: Speech Pathology

## 2018-03-06 ENCOUNTER — Encounter (HOSPITAL_COMMUNITY): Payer: Self-pay | Admitting: Emergency Medicine

## 2018-03-06 ENCOUNTER — Inpatient Hospital Stay (HOSPITAL_COMMUNITY)
Admission: EM | Admit: 2018-03-06 | Discharge: 2018-03-13 | DRG: 057 | Disposition: A | Discharge status: Home / Self Care | Payer: Medicare Other | Attending: Internal Medicine | Admitting: Internal Medicine

## 2018-03-06 DIAGNOSIS — J9811 Atelectasis: Secondary | ICD-10-CM | POA: Diagnosis not present

## 2018-03-06 DIAGNOSIS — R1313 Dysphagia, pharyngeal phase: Secondary | ICD-10-CM | POA: Diagnosis present

## 2018-03-06 DIAGNOSIS — Z79899 Other long term (current) drug therapy: Secondary | ICD-10-CM | POA: Diagnosis not present

## 2018-03-06 DIAGNOSIS — E876 Hypokalemia: Secondary | ICD-10-CM | POA: Diagnosis present

## 2018-03-06 DIAGNOSIS — G250 Essential tremor: Secondary | ICD-10-CM | POA: Diagnosis present

## 2018-03-06 DIAGNOSIS — Z4682 Encounter for fitting and adjustment of non-vascular catheter: Secondary | ICD-10-CM | POA: Diagnosis not present

## 2018-03-06 DIAGNOSIS — N401 Enlarged prostate with lower urinary tract symptoms: Secondary | ICD-10-CM | POA: Diagnosis present

## 2018-03-06 DIAGNOSIS — R471 Dysarthria and anarthria: Secondary | ICD-10-CM | POA: Diagnosis present

## 2018-03-06 DIAGNOSIS — Z7982 Long term (current) use of aspirin: Secondary | ICD-10-CM | POA: Diagnosis not present

## 2018-03-06 DIAGNOSIS — R9431 Abnormal electrocardiogram [ECG] [EKG]: Secondary | ICD-10-CM | POA: Diagnosis not present

## 2018-03-06 DIAGNOSIS — Z8249 Family history of ischemic heart disease and other diseases of the circulatory system: Secondary | ICD-10-CM | POA: Diagnosis not present

## 2018-03-06 DIAGNOSIS — R001 Bradycardia, unspecified: Secondary | ICD-10-CM | POA: Diagnosis not present

## 2018-03-06 DIAGNOSIS — Z8673 Personal history of transient ischemic attack (TIA), and cerebral infarction without residual deficits: Secondary | ICD-10-CM | POA: Diagnosis not present

## 2018-03-06 DIAGNOSIS — K219 Gastro-esophageal reflux disease without esophagitis: Secondary | ICD-10-CM | POA: Diagnosis present

## 2018-03-06 DIAGNOSIS — Z66 Do not resuscitate: Secondary | ICD-10-CM | POA: Diagnosis present

## 2018-03-06 DIAGNOSIS — G459 Transient cerebral ischemic attack, unspecified: Secondary | ICD-10-CM | POA: Diagnosis not present

## 2018-03-06 DIAGNOSIS — R739 Hyperglycemia, unspecified: Secondary | ICD-10-CM

## 2018-03-06 DIAGNOSIS — R1310 Dysphagia, unspecified: Secondary | ICD-10-CM

## 2018-03-06 DIAGNOSIS — R1312 Dysphagia, oropharyngeal phase: Secondary | ICD-10-CM

## 2018-03-06 DIAGNOSIS — N138 Other obstructive and reflux uropathy: Secondary | ICD-10-CM | POA: Diagnosis present

## 2018-03-06 DIAGNOSIS — G7001 Myasthenia gravis with (acute) exacerbation: Principal | ICD-10-CM | POA: Diagnosis present

## 2018-03-06 DIAGNOSIS — Z8546 Personal history of malignant neoplasm of prostate: Secondary | ICD-10-CM

## 2018-03-06 DIAGNOSIS — G7000 Myasthenia gravis without (acute) exacerbation: Secondary | ICD-10-CM | POA: Diagnosis present

## 2018-03-06 DIAGNOSIS — J984 Other disorders of lung: Secondary | ICD-10-CM | POA: Diagnosis not present

## 2018-03-06 DIAGNOSIS — I10 Essential (primary) hypertension: Secondary | ICD-10-CM | POA: Diagnosis present

## 2018-03-06 DIAGNOSIS — E7800 Pure hypercholesterolemia, unspecified: Secondary | ICD-10-CM | POA: Diagnosis present

## 2018-03-06 DIAGNOSIS — H518 Other specified disorders of binocular movement: Secondary | ICD-10-CM | POA: Diagnosis present

## 2018-03-06 HISTORY — DX: Myasthenia gravis without (acute) exacerbation: G70.00

## 2018-03-06 LAB — COMPREHENSIVE METABOLIC PANEL
ALT: 27 U/L (ref 0–44)
AST: 27 U/L (ref 15–41)
Albumin: 4.3 g/dL (ref 3.5–5.0)
Alkaline Phosphatase: 65 U/L (ref 38–126)
Anion gap: 12 (ref 5–15)
BUN: 23 mg/dL (ref 8–23)
CO2: 26 mmol/L (ref 22–32)
Calcium: 9.5 mg/dL (ref 8.9–10.3)
Chloride: 103 mmol/L (ref 98–111)
Creatinine, Ser: 1.12 mg/dL (ref 0.61–1.24)
GFR calc non Af Amer: >60 mL/min (ref >60)
Glucose, Bld: 90 mg/dL (ref 70–99)
Potassium: 3.9 mmol/L (ref 3.5–5.1)
Sodium: 141 mmol/L (ref 135–145)
Total Bilirubin: 1.3 mg/dL — ABNORMAL HIGH (ref 0.3–1.2)
Total Protein: 7 g/dL (ref 6.5–8.1)

## 2018-03-06 LAB — CBC
HCT: 49.3 % (ref 39.0–52.0)
HCT: 44.5 % (ref 39.0–52.0)
HEMOGLOBIN: 14.9 g/dL (ref 13.0–17.0)
Hemoglobin: 16.3 g/dL (ref 13.0–17.0)
MCH: 31.6 pg (ref 26.0–34.0)
MCH: 31.4 pg (ref 26.0–34.0)
MCHC: 33.1 g/dL (ref 30.0–36.0)
MCHC: 33.5 g/dL (ref 30.0–36.0)
MCV: 95.5 fL (ref 80.0–100.0)
MCV: 93.9 fL (ref 80.0–100.0)
Platelets: 245 10*3/uL (ref 150–400)
Platelets: 217 10*3/uL (ref 150–400)
RBC: 5.16 MIL/uL (ref 4.22–5.81)
RBC: 4.74 MIL/uL (ref 4.22–5.81)
RDW: 11.7 % (ref 11.5–15.5)
RDW: 11.6 % (ref 11.5–15.5)
WBC: 11.7 10*3/uL — ABNORMAL HIGH (ref 4.0–10.5)
WBC: 10.8 10*3/uL — ABNORMAL HIGH (ref 4.0–10.5)
nRBC: 0 % (ref 0.0–0.2)
nRBC: 0 % (ref 0.0–0.2)

## 2018-03-06 LAB — CREATININE, SERUM
Creatinine, Ser: 1.13 mg/dL (ref 0.61–1.24)
GFR calc Af Amer: >60 mL/min (ref >60)
GFR calc non Af Amer: >60 mL/min (ref >60)

## 2018-03-06 LAB — DIFFERENTIAL
Abs Immature Granulocytes: 0.04 10*3/uL (ref 0.00–0.07)
Basophils Absolute: 0.1 10*3/uL (ref 0.0–0.1)
Basophils Relative: 1 %
Eosinophils Absolute: 0.2 10*3/uL (ref 0.0–0.5)
Eosinophils Relative: 1 %
Immature Granulocytes: 0 %
Lymphocytes Relative: 25 %
Lymphs Abs: 2.9 10*3/uL (ref 0.7–4.0)
MONO ABS: 0.8 10*3/uL (ref 0.1–1.0)
Monocytes Relative: 7 %
Neutro Abs: 7.7 10*3/uL (ref 1.7–7.7)
Neutrophils Relative %: 66 %

## 2018-03-06 LAB — APTT: aPTT: 27 seconds (ref 24–36)

## 2018-03-06 LAB — PROTIME-INR
INR: 1.02
Prothrombin Time: 13.4 seconds (ref 11.4–15.2)

## 2018-03-06 LAB — CBG MONITORING, ED: Glucose-Capillary: 90 mg/dL (ref 70–99)

## 2018-03-06 MED ORDER — OXYBUTYNIN CHLORIDE ER 5 MG PO TB24
5.0000 mg | ORAL_TABLET | Freq: Three times a day (TID) | ORAL | Status: DC
  Administered 2018-03-08: 5 mg via ORAL
  Filled 2018-03-06 (×8): qty 1

## 2018-03-06 MED ORDER — ATORVASTATIN CALCIUM 10 MG PO TABS
20.0000 mg | ORAL_TABLET | Freq: Every day | ORAL | Status: DC
  Administered 2018-03-07 – 2018-03-13 (×7): 20 mg via NASOGASTRIC
  Filled 2018-03-06 (×8): qty 2

## 2018-03-06 MED ORDER — MIDAZOLAM HCL 2 MG/2ML IJ SOLN
2.0000 mg | Freq: Once | INTRAMUSCULAR | Status: AC
  Administered 2018-03-06: 2 mg via INTRAVENOUS

## 2018-03-06 MED ORDER — MIDAZOLAM HCL 2 MG/2ML IJ SOLN
INTRAMUSCULAR | Status: AC
  Filled 2018-03-06: qty 2

## 2018-03-06 MED ORDER — IOHEXOL 300 MG/ML  SOLN
75.0000 mL | Freq: Once | INTRAMUSCULAR | Status: AC | PRN
  Administered 2018-03-06: 75 mL via INTRAVENOUS

## 2018-03-06 MED ORDER — MIDAZOLAM HCL 2 MG/2ML IJ SOLN
1.0000 mg | Freq: Once | INTRAMUSCULAR | Status: AC
  Administered 2018-03-06: 1 mg via INTRAVENOUS
  Filled 2018-03-06: qty 2

## 2018-03-06 MED ORDER — PYRIDOSTIGMINE BROMIDE 60 MG/5ML PO SOLN
90.0000 mg | Freq: Once | ORAL | Status: DC
  Filled 2018-03-06 (×2): qty 7.5

## 2018-03-06 MED ORDER — PANTOPRAZOLE SODIUM 40 MG PO TBEC
40.0000 mg | DELAYED_RELEASE_TABLET | Freq: Every day | ORAL | Status: DC
  Administered 2018-03-07 – 2018-03-13 (×6): 40 mg via ORAL
  Filled 2018-03-06 (×8): qty 1

## 2018-03-06 MED ORDER — PYRIDOSTIGMINE BROMIDE 60 MG PO TABS: 60.0000 mg | ORAL_TABLET | Freq: Three times a day (TID) | ORAL | Status: DC

## 2018-03-06 MED ORDER — SODIUM CHLORIDE 0.9 % IV SOLN
INTRAVENOUS | Status: AC
  Administered 2018-03-06 – 2018-03-07 (×3): via INTRAVENOUS

## 2018-03-06 MED ORDER — PYRIDOSTIGMINE BROMIDE 10 MG/2ML IV SOLN
2.0000 mg | Freq: Three times a day (TID) | INTRAVENOUS | Status: DC
  Filled 2018-03-06 (×2): qty 0.4

## 2018-03-06 MED ORDER — PYRIDOSTIGMINE BROMIDE 60 MG/5ML PO SOLN
60.0000 mg | Freq: Three times a day (TID) | ORAL | Status: DC
  Administered 2018-03-07 – 2018-03-09 (×7): 60 mg via ORAL
  Filled 2018-03-06 (×11): qty 5

## 2018-03-06 MED ORDER — HYDROCHLOROTHIAZIDE 25 MG PO TABS
25.0000 mg | ORAL_TABLET | Freq: Every day | ORAL | Status: DC
  Administered 2018-03-07 – 2018-03-13 (×8): 25 mg via ORAL
  Filled 2018-03-06 (×8): qty 1

## 2018-03-06 MED ORDER — ASPIRIN 81 MG PO CHEW
81.0000 mg | CHEWABLE_TABLET | Freq: Every day | ORAL | Status: DC
  Administered 2018-03-07 – 2018-03-13 (×7): 81 mg via NASOGASTRIC
  Filled 2018-03-06 (×9): qty 1

## 2018-03-06 MED ORDER — SODIUM CHLORIDE 0.9% FLUSH
3.0000 mL | Freq: Once | INTRAVENOUS | Status: DC
Start: 2018-03-06 — End: 2018-03-13

## 2018-03-06 MED ORDER — LOSARTAN POTASSIUM-HCTZ 100-25 MG PO TABS: 1.0000 | ORAL_TABLET | Freq: Every day | ORAL | Status: DC

## 2018-03-06 MED ORDER — IMMUNE GLOBULIN (HUMAN) 5 GM/50ML IV SOLN
400.0000 mg/kg | INTRAVENOUS | Status: AC
  Administered 2018-03-06 – 2018-03-09 (×4): 35 g via INTRAVENOUS
  Filled 2018-03-06 (×5): qty 50

## 2018-03-06 MED ORDER — ENOXAPARIN SODIUM 40 MG/0.4ML ~~LOC~~ SOLN
40.0000 mg | SUBCUTANEOUS | Status: DC
  Administered 2018-03-06 – 2018-03-12 (×7): 40 mg via SUBCUTANEOUS
  Filled 2018-03-06 (×7): qty 0.4

## 2018-03-06 MED ORDER — PREDNISONE 20 MG PO TABS
20.0000 mg | ORAL_TABLET | Freq: Every day | ORAL | Status: DC
  Administered 2018-03-07 – 2018-03-09 (×2): 20 mg via ORAL
  Filled 2018-03-06 (×3): qty 1

## 2018-03-06 MED ORDER — LOSARTAN POTASSIUM 50 MG PO TABS
100.0000 mg | ORAL_TABLET | Freq: Every day | ORAL | Status: DC
  Administered 2018-03-07 – 2018-03-13 (×7): 100 mg via ORAL
  Filled 2018-03-06 (×8): qty 2

## 2018-03-06 MED ORDER — METOPROLOL SUCCINATE ER 25 MG PO TB24
50.0000 mg | ORAL_TABLET | Freq: Every day | ORAL | Status: DC
  Administered 2018-03-07 – 2018-03-09 (×3): 50 mg via ORAL
  Filled 2018-03-06 (×3): qty 2

## 2018-03-06 NOTE — Progress Notes (Signed)
IR is unable to place NG tube today and will place tomorrow, Chatter, Kyra Searles MD notified, RN advised to call  Neuro for clarification because pt's medication is not safe giving IV. RN notified DR Leonel Ramsay and It is okay to wait until tomorrow.

## 2018-03-06 NOTE — ED Provider Notes (Signed)
[facility] EMERGENCY DEPARTMENT Provider Note   CSN: [id number] Arrival date & time: 03/06/18  5573    History   Chief Complaint Chief Complaint  Patient presents with  . Neurologic Problem    HPI Travis Morris is a 78 y.o. male.     78 yo M with a chief complaint of feeling like his tongue is swollen and having difficulty swallowing.  Patient was able to swallow a small amount of coffee and a small amount of cereal.  He is unable to take his physostigmine because he felt that the pill was too big.  He was able to take his morning dose of prednisone.  He was recently in the hospital and diagnosed with presumed myasthenia gravis.  He has been taking physostigmine without improvement of his symptoms.  Has a follow-up appointment on Friday with outpatient neurologist.  He feels a little bit short of breath but otherwise states that he has been able to ambulate and moves his arms and legs without difficulty.  He had an episode where he tried to swallow his coffee and felt he could not and then he felt like he had tingling around his mouth and diffusely about his body.  This eventually resolved.  The history is provided by the patient and the spouse.  Neurologic Problem  Pertinent negatives include no chest pain, no abdominal pain, no headaches and no shortness of breath.  Illness  Severity:  Moderate Onset quality:  Sudden Duration:  2 hours Timing:  Constant Progression:  Unchanged Chronicity:  New Associated symptoms: no abdominal pain, no chest pain, no congestion, no diarrhea, no fever, no headaches, no myalgias, no rash, no shortness of breath and no vomiting     Past Medical History:  Diagnosis Date  . BPH with urinary obstruction   . GERD (gastroesophageal reflux disease)    ESOPHAGEAL REFLUX HX  . History of balanitis   . History of phimosis   . History of repair of rotator cuff   . Hx of biopsy    PENIS CUTANEOUS HX  . Hypercholesteremia   .  Hypertension   . Macular hole    AIR FLUID EXCHANGE   . Organic impotence   . Prostate cancer [facility])     Patient Active Problem List   Diagnosis Date Noted  . Myasthenia gravis ([place]) 03/06/2018  . Dysphagia 03/06/2018  . Diplopia 02/28/2018  . Essential tremor 08/11/2015  . Male erectile dysfunction 07/16/2015  . Other abnormal glucose 07/16/2015  . Hyperlipidemia 07/16/2015  . Gastro-esophageal reflux disease without esophagitis 07/16/2015  . Essential (primary) hypertension 07/16/2015  . Malignant neoplasm of prostate ([place]) 07/16/2015  . Tremor 07/16/2015  . Vertigo 08/24/2014  . History of hypercholesterolemia 08/24/2014  . Sinus bradycardia 08/24/2014  . TIA (transient ischemic attack)   . Prostate cancer [facility])     Past Surgical History:  Procedure Laterality Date  . APPENDECTOMY    . PARS PLANA VITRECTOMY W/ REPAIR OF MACULAR HOLE     x3  . ROTATOR CUFF REPAIR Right   . UVULOPLASTY     LASER ASSISTED        Home Medications    Prior to Admission medications   Medication Sig Start Date End Date Taking? Authorizing Provider  Ascorbic Acid (VITAMIN C) 100 MG tablet Take 100 mg by mouth daily.   Yes [provider]  aspirin 81 MG tablet Take 81 mg by mouth daily.     Yes [provider]  atorvastatin (LIPITOR) 20 MG tablet Take 20 mg by mouth daily.   Yes [provider]  losartan-hydrochlorothiazide (HYZAAR) 100-25 MG tablet Take 1 tablet by mouth daily. 08/26/15  Yes Lelon Perla, MD  metoprolol succinate (TOPROL-XL) 50 MG 24 hr tablet Take 1 tablet (50 mg total) by mouth daily. NEED OV. Patient taking differently: Take 50 mg by mouth daily.  03/09/17  Yes Lelon Perla, MD  Multiple Vitamin (MULTIVITAMINS PO) Take 1 tablet by mouth daily. CENTRUM SILVER    Yes [provider]  omeprazole (PRILOSEC) 20 MG capsule Take 20 mg by mouth daily.     Yes [provider]  oxybutynin (DITROPAN-XL) 5 MG 24 hr tablet Take 5  mg by mouth 3 (three) times daily.    Yes [provider]  predniSONE (DELTASONE) 10 MG tablet 20 mg po Q daily x 6 days then 30 mg PO Q daily for 7 days then 40 mg Po Q daily for one month Patient taking differently: Take 10 mg by mouth 2 (two) times daily.  03/01/18  Yes Pokhrel, Laxman, MD  pyridostigmine (MESTINON) 60 MG tablet Take 1 tablet (60 mg total) by mouth 3 (three) times daily. 03/01/18  Yes Pokhrel, Laxman, MD  vitamin E 400 UNIT capsule Take 400 Units by mouth daily.     Yes [provider]    Family History Family History  Problem Relation Age of Onset  . Dementia Father   . Colon cancer Brother        mets to liver   . Liver cancer Brother   . Heart attack Brother        MI at age 2  . Healthy Son   . Healthy Daughter     Social History Social History   Tobacco Use  . Smoking status: Never Smoker  . Smokeless tobacco: Never Used  Substance Use Topics  . Alcohol use: Yes    Alcohol/week: 0.0 standard drinks    Comment: 2 oz gin weekly  . Drug use: No     Allergies   Patient has no known allergies.   Review of Systems Review of Systems  Constitutional: Negative for chills and fever.  HENT: Negative for congestion and facial swelling.   Eyes: Negative for discharge and visual disturbance.  Respiratory: Negative for shortness of breath.   Cardiovascular: Negative for chest pain and palpitations.  Gastrointestinal: Negative for abdominal pain, diarrhea and vomiting.  Musculoskeletal: Negative for arthralgias and myalgias.  Skin: Negative for color change and rash.  Neurological: Positive for weakness. Negative for tremors, syncope and headaches.  Psychiatric/Behavioral: Negative for confusion and dysphoric mood.     Physical Exam Updated Vital Signs BP 138/63   Pulse (!) 52   Temp 97.6 F (36.4 C) (Oral)   Resp 20   SpO2 97%   Physical Exam Vitals signs and nursing note reviewed.  Constitutional:      Appearance: He is  well-developed.  HENT:     Head: Normocephalic and atraumatic.  Eyes:     Pupils: Pupils are equal, round, and reactive to light.  Neck:     Musculoskeletal: Normal range of motion and neck supple.     Vascular: No JVD.  Cardiovascular:     Rate and Rhythm: Normal rate and regular rhythm.     Heart sounds: No murmur. No friction rub. No gallop.   Pulmonary:     Effort: No respiratory distress.     Breath sounds: No wheezing.  Abdominal:     General: There is no distension.     Tenderness: There is no guarding or rebound.  Musculoskeletal: Normal range of motion.  Skin:    Coloration: Skin is not pale.     Findings: No rash.  Neurological:     Mental Status: He is alert and oriented to person, place, and time.     Cranial Nerves: Cranial nerves are intact.     Sensory: Sensation is intact.     Motor: Motor function is intact.     Coordination: Coordination is intact.     Gait: Gait is intact.     Comments: Fine tremor to bilateral upper extremities.  Neuro exam is otherwise benign.  Psychiatric:        Behavior: Behavior normal.      ED Treatments / Results  Labs (all labs ordered are listed, but only abnormal results are displayed) Labs Reviewed  CBC - Abnormal; Notable for the following components:      Result Value   WBC 11.7 (*)    All other components within normal limits  COMPREHENSIVE METABOLIC PANEL - Abnormal; Notable for the following components:   Total Bilirubin 1.3 (*)    All other components within normal limits  PROTIME-INR  APTT  DIFFERENTIAL  CBG MONITORING, ED    EKG EKG Interpretation  Date/Time:  Tuesday March 06 2018 09:47:31 EST Ventricular Rate:  56 PR Interval:  208 QRS Duration: 92 QT Interval:  424 QTC Calculation: 409 R Axis:   67 Text Interpretation:  Sinus bradycardia Nonspecific T wave abnormality Abnormal ECG No significant change since last tracing Confirmed by Deno Etienne [phone]) on 03/06/2018 10:11:15 AM Also confirmed by  Deno Etienne [id number]), editor Philomena Doheny [phone])  on 03/06/2018 11:43:23 AM   Radiology Dg Chest Port 1 View  Result Date: 03/06/2018 CLINICAL DATA:  Weakness and dysphagia. EXAM: PORTABLE CHEST 1 VIEW COMPARISON:  08/24/2014 FINDINGS: The heart size and mediastinal contours are within normal limits. Stable bibasilar pulmonary scarring. There is no evidence of pulmonary edema, consolidation, pneumothorax, nodule or pleural fluid. The visualized skeletal structures are unremarkable. IMPRESSION: No active disease. Electronically Signed   By: Aletta Edouard M.D.   On: 03/06/2018 14:21    Procedures Procedures (including critical care time)  Medications Ordered in ED Medications  sodium chloride flush (NS) 0.9 % injection 3 mL (3 mLs Intravenous Not Given 03/06/18 1031)  pyridostigmine (MESTINON) 60 MG/5ML solution 60 mg (has no administration in time range)  predniSONE (DELTASONE) tablet 20 mg (has no administration in time range)  midazolam (VERSED) injection 1 mg (1 mg Intravenous Given 03/06/18 1324)  midazolam (VERSED) injection 2 mg (2 mg Intravenous Given 03/06/18 1347)     Initial Impression / Assessment and Plan / ED Course  I have reviewed the triage vital signs and the nursing notes.  Pertinent labs & imaging results that were available during my care of the patient were reviewed by me and considered in my medical decision making (see chart for details).        77 yo M with a chief complaint of difficulty swallowing.  Started this morning.  He was recently in the hospital and diagnosed presumptively with myasthenia gravis has follow-up with neurologist on Friday.  Was able to talk and swallow normally yesterday per wife and the family now with slurred speech feeling like his tongue is swollen and difficulty swallowing.  He is good respiratory effort and has clear lung sounds is tolerating his  secretions without difficulty.  Will discuss the case with the neurologist.  He  suggested trying a oral dose of his medicine if he is able to tolerate this and had improvement over an hour then he could likely be discharged home.  The patient unfortunately aspirated his oral solution of his medication.  We will attempt to place an NG tube and admit.  During NG tube placement it seems that the tube was likely placed into the airway has the patient had significant coughing fits and had some hypoxia and the tube was removed.  He improved on nonrebreather and was transitioned back to her room air.  Neurology confirmed his diagnosis of myasthenia gravis will start him on IV IG, will put an order in for IR to place an NG tube under fluoroscopy.  CRITICAL CARE Performed by: Cecilio Asper   Total critical care time: 80 minutes  Critical care time was exclusive of separately billable procedures and treating other patients.  Critical care was necessary to treat or prevent imminent or life-threatening deterioration.  Critical care was time spent personally by me on the following activities: development of treatment plan with patient and/or surrogate as well as nursing, discussions with consultants, evaluation of patient's response to treatment, examination of patient, obtaining history from patient or surrogate, ordering and performing treatments and interventions, ordering and review of laboratory studies, ordering and review of radiographic studies, pulse oximetry and re-evaluation of patient's condition.  The patients results and plan were reviewed and discussed.   Any x-rays performed were independently reviewed by myself.   Differential diagnosis were considered with the presenting HPI.  Medications  sodium chloride flush (NS) 0.9 % injection 3 mL (3 mLs Intravenous Not Given 03/06/18 1031)  pyridostigmine (MESTINON) 60 MG/5ML solution 60 mg (has no administration in time range)  predniSONE (DELTASONE) tablet 20 mg (has no administration in time range)  midazolam  (VERSED) injection 1 mg (1 mg Intravenous Given 03/06/18 1324)  midazolam (VERSED) injection 2 mg (2 mg Intravenous Given 03/06/18 1347)    Vitals:   03/06/18 1030 03/06/18 1100 03/06/18 1345 03/06/18 1500  BP:  138/63    Pulse: (!) 56 (!) 52 85 (!) 52  Resp: 13 17 (!) 21 20  Temp:      TempSrc:      SpO2: 96% 97% 98% 97%    Final diagnoses:  Dysphagia  MG, crisis (myasthenia gravis) ([place])    Admission/ observation were discussed with the admitting physician, patient and/or family and they are comfortable with the plan.   Final Clinical Impressions(s) / ED Diagnoses   Final diagnoses:  Dysphagia  MG, crisis (myasthenia gravis) [facility])    ED Discharge Orders    None       Deno Etienne, DO 03/06/18 1541

## 2018-03-06 NOTE — ED Notes (Signed)
Attempted NG tube placement. Pt began coughing, choking, unable to ascultate bs over abdomen, tube removed. Dr. Tyrone Nine aware. Pt began having spasms, sats to 66s. NRB applied.

## 2018-03-06 NOTE — ED Triage Notes (Signed)
Pt reports he was at [facility] with double vision and swallowing difficulties. They believe he has myasthenia gravidas but confirmed test not back. He states today his swallowing became increasingly difficult. States tongue feels swollen and speech sounds thick

## 2018-03-06 NOTE — Progress Notes (Signed)
NIF= > -40  VC= 2.9L   Good pt effort.

## 2018-03-06 NOTE — ED Notes (Signed)
Speech pathology notified that  Pt just recently dx with MG and has been just started to work with speech at home, they said they would be here to eval since he failed his swallow

## 2018-03-06 NOTE — Progress Notes (Signed)
NIF and VC performed with Pt and wife at bedside. NIF was -30 and VC 2400.

## 2018-03-06 NOTE — Consult Note (Addendum)
Neurology Consultation  Reason for Consult: Referring Physician: Tyrone Nine  CC:  Myasthenia gravis exacerbation  History is obtained from: Patient and wife  HPI: Travis Morris is a 78 y.o. male with past medical history of hypertension, hypercholesterolemia, rotator cuff repair.  Patient was recently hospitalized secondary to diplopia and difficulty swallowing.  At that time patient was started on Mestinon and dural Dosepak and had an appointment to see Dr. Carles Collet however patient returns to the hospital secondary to waking up with significant dysphasia and dysarthria.  Patient was unable to take his Mestinon this a.m. secondary to not being able to swallow.  Mestinon was attempted to be given via through liquid form again he could not tolerate that.   Of note patient does not feel as though there was any significant change over the last few days with the Mestinon.  But clearly there was a change this morning.   Chart review: The 12th.  Below is the HPI from that visit: Patient states most of his symptoms occurred early this December.  He noted that during a party he was eating food and felt as though his lower lip was swelling and he had difficulty chewing and swallowing.  For this reason he started chopping up his food and fine pieces but noted that he would get tired after a while eating those fine pieces.  Later, during Christmas they had a steak dinner, and he noted that as he was chewing the steak his jaw would get very tired and as he was swallowing it become difficult for him to swallow.  He does acknowledge that he has become weaker when going up stairs especially at the end of the stairs and with repetitive motion he does get weaker.  The reason he came to the hospital at this time was secondary to the fact that while he was watching golf this past Sunday he noted after a while he was seeing skew deviation.  Currently he still has skew deviation.  MRI has been obtained and does not show any  acute stroke.   ROS: A 14 point ROS was performed and is negative except as noted in the HPI.  Past Medical History:  Diagnosis Date  . BPH with urinary obstruction   . GERD (gastroesophageal reflux disease)    ESOPHAGEAL REFLUX HX  . History of balanitis   . History of phimosis   . History of repair of rotator cuff   . Hx of biopsy    PENIS CUTANEOUS HX  . Hypercholesteremia   . Hypertension   . Macular hole    AIR FLUID EXCHANGE   . Organic impotence   . Prostate cancer [facility])     Family History  Problem Relation Age of Onset  . Dementia Father   . Colon cancer Brother        mets to liver   . Liver cancer Brother   . Heart attack Brother        MI at age 23  . Healthy Son   . Healthy Daughter    Social History:   reports that he has never smoked. He has never used smokeless tobacco. He reports current alcohol use. He reports that he does not use drugs.  Medications  Current Facility-Administered Medications:  .  midazolam (VERSED) injection 1 mg, 1 mg, Intravenous, Once, Tyrone Nine, Dan, DO .  pyridostigmine (MESTINON) 60 MG/5ML solution 90 mg, 90 mg, Oral, Once, Tyrone Nine, Dan, DO .  sodium chloride flush (NS) 0.9 % injection  3 mL, 3 mL, Intravenous, Once, Deno Etienne, DO  Current Outpatient Medications:  .  Ascorbic Acid (VITAMIN C) 100 MG tablet, Take 100 mg by mouth daily., Disp: , Rfl:  .  aspirin 81 MG tablet, Take 81 mg by mouth daily.  , Disp: , Rfl:  .  atorvastatin (LIPITOR) 20 MG tablet, Take 20 mg by mouth daily., Disp: , Rfl:  .  losartan-hydrochlorothiazide (HYZAAR) 100-25 MG tablet, Take 1 tablet by mouth daily., Disp: 90 tablet, Rfl: 1 .  metoprolol succinate (TOPROL-XL) 50 MG 24 hr tablet, Take 1 tablet (50 mg total) by mouth daily. NEED OV., Disp: 15 tablet, Rfl: 0 .  Multiple Vitamin (MULTIVITAMINS PO), Take 1 tablet by mouth daily. CENTRUM SILVER , Disp: , Rfl:  .  omeprazole (PRILOSEC) 20 MG capsule, Take 20 mg by mouth daily.  , Disp: , Rfl:  .   oxybutynin (DITROPAN-XL) 5 MG 24 hr tablet, Take 5 mg by mouth 3 (three) times daily. , Disp: , Rfl:  .  predniSONE (DELTASONE) 10 MG tablet, 20 mg po Q daily x 6 days then 30 mg PO Q daily for 7 days then 40 mg Po Q daily for one month, Disp: 150 tablet, Rfl: 0 .  pyridostigmine (MESTINON) 60 MG tablet, Take 1 tablet (60 mg total) by mouth 3 (three) times daily., Disp: 90 tablet, Rfl: 0 .  vitamin E 400 UNIT capsule, Take 400 Units by mouth daily.  , Disp: , Rfl:    Exam: Current vital signs: BP (!) 156/108   Pulse (!) 56   Temp 97.6 F (36.4 C) (Oral)   Resp 13   SpO2 96%  Vital signs in last 24 hours: Temp:  [97.6 F (36.4 C)] 97.6 F (36.4 C) (02/18 0951) Pulse Rate:  [50-56] 56 (02/18 1030) Resp:  [13-18] 13 (02/18 1030) BP: (156-168)/(71-108) 156/108 (02/18 1023) SpO2:  [96 %-97 %] 96 % (02/18 1030)  Physical Exam  Constitutional: Appears well-developed and well-nourished.  Psych: Affect appropriate to situation Eyes: No scleral injection HENT: No OP obstrucion Head: Normocephalic.  Cardiovascular: Normal rate and regular rhythm.  Respiratory: Effort normal, non-labored breathing GI: Soft.  No distension. There is no tenderness.  Skin: WDI  Neuro: Mental Status: Patient is awake, alert, oriented to person, place, month, year, and situation. Patient is able to give a clear and coherent history. Significant dysarthria but no aphasia Cranial Nerves: II: Visual Fields are full.  III,IV, VI: EOMI without ptosis or diploplia. Pupils are equal, round, and reactive to light.  I did not notice any lid lag with vertical gaze over 1 minute but he did state that he had increased double vision. V: Facial sensation is symmetric to temperature VII: Facial movement is symmetric.  VIII: hearing is intact to voice X: Uvula elevates symmetrically XI: Shoulder shrug is symmetric. XII: tongue is midline without atrophy or fasciculations.  Motor: Tone is normal. Bulk is normal. 5/5  strength was present in all four extremities.  Sensory: Sensation is symmetric to light touch and temperature in the arms and legs. Deep Tendon Reflexes: Depressed throughout Plantars: Toes are downgoing bilaterally.  Cerebellar: FNF and HKS are intact bilaterally  Labs I have reviewed labs in epic and the results pertinent to this consultation are:   CBC    Component Value Date/Time   WBC 11.7 (H) 03/06/2018 1009   RBC 5.16 03/06/2018 1009   HGB 16.3 03/06/2018 1009   HCT 49.3 03/06/2018 1009   PLT 245 03/06/2018  1009   MCV 95.5 03/06/2018 1009   MCH 31.6 03/06/2018 1009   MCHC 33.1 03/06/2018 1009   RDW 11.7 03/06/2018 1009   LYMPHSABS 2.9 03/06/2018 1009   MONOABS 0.8 03/06/2018 1009   EOSABS 0.2 03/06/2018 1009   BASOSABS 0.1 03/06/2018 1009    CMP     Component Value Date/Time   NA 141 03/06/2018 1009   K 3.9 03/06/2018 1009   CL 103 03/06/2018 1009   CO2 26 03/06/2018 1009   GLUCOSE 90 03/06/2018 1009   BUN 23 03/06/2018 1009   CREATININE 1.12 03/06/2018 1009   CREATININE 1.06 08/20/2015 1334   CALCIUM 9.5 03/06/2018 1009   PROT 7.0 03/06/2018 1009   ALBUMIN 4.3 03/06/2018 1009   AST 27 03/06/2018 1009   ALT 27 03/06/2018 1009   ALKPHOS 65 03/06/2018 1009   BILITOT 1.3 (H) 03/06/2018 1009   GFRNONAA >60 03/06/2018 1009   GFRAA >60 03/06/2018 1009    Lipid Panel     Component Value Date/Time   CHOL 115 02/28/2018 2054   TRIG 62 02/28/2018 2054   HDL 30 (L) 02/28/2018 2054   CHOLHDL 3.8 02/28/2018 2054   VLDL 12 02/28/2018 2054   LDLCALC 73 02/28/2018 2054    Acetylcholine blocking Ab was 60 and positive.   Imaging No imaging was done at this point.  Etta Quill PA-C [facility] [phone]  M-F  (9:00 am- 5:00 PM)  03/06/2018, 1:20 PM   I have seen the patient reviewed the above note.  He has my diplopia on upgaze, able to count to >35 on a single breath, but sounds dysphonic.   Assessment:  78 year old male with  myasthenia gravis   Patient presents with worsening dysphasia and dysarthria.  The dysphagia is his limiting factor given that if he cannot take oral medications or food and water then obviously he cannot be discharged.   Impression: -Myasthenia gravis exacerbation  Recommendations: - Mestinon 60 mg 3 times daily per tube -IVIG 400 mg/kg/day x 5 days for a total of 2 g/kg -Once he begins having response to IVIG, I will increase his prednisone dose continue 20 mg/day for now -CT chest to evaluate for thymoma, especially given striated antibody positivity  Roland Rack, MD [facility] [phone]  If 7pm- 7am, please page neurology on call as listed in [place].

## 2018-03-06 NOTE — Progress Notes (Signed)
SLP Cancellation Note  Patient Details Name: Travis Morris MRN: [id number] DOB: Aug 23, 1940   Cancelled treatment:        Pt admitted for dysphagia; attempts to place NGT apparently went through vocal cords, pt had bronchospasm. History of pharyngeal and esophageal (dysmotility) dysphagia with his Myasthenia Gravis. Had MBS 02/05/18 with penetration of thin liquids, Dys 3, thin recommended. Meds are currently IV. Recommend continue NPO with MBS tomorrow.    Houston Siren 03/06/2018, 2:19 PM  Orbie Pyo Colvin Caroli.Ed Risk analyst [phone] Office [phone]

## 2018-03-06 NOTE — Progress Notes (Signed)
Pt arrived unit safely. Call bell by bed side, education provided. Safety measures and fall precaution done.

## 2018-03-06 NOTE — H&P (Addendum)
History and Physical:    Travis Morris   BHA:[id number] DOB: 02-15-1940 DOA: 03/06/2018  Referring MD/provider: Dr. Tyrone Nine PCP: London Pepper, MD   Patient coming from: Home  Chief Complaint: inability to swallow  History of Present Illness:   Travis Morris is an 78 y.o. male with past medical history significant for hypertension, GERD, prostate cancer and essential tremor who has developed diplopia and dysphagia over the past 6 weeks. He was recently discharged last week from [facility] where he was worked up for myasthenia gravis and started on prednisone and Mestinon pending results of ACTH receptor antibodies. Patient had been doing reasonably well until this morning when he noted that he suddenly developed a complete inability to swallow. Patient notes he had a reasonable dinner last night as long as he ate slowly however this morning he was unable to drink even more than 2-3 sips of coffee, so he came in for evaluation.  In the ED patient was evaluated by neurology who recommended placement of NG tube so he could take the Mestinon. Unfortunately NG tube placement was eventful with possible placement in his trachea. Patient had episode of significant desaturation to the mid 50s with coughing and oxygen requirement thereafter. However since then patient is feeling much better and no longer feels short of breath, cough is resolved and he is breathing well on room air.  Neurology has been monitoring patient closely and they recommended admission for ongoing treatment for myasthenia gravis. Of note his ACTH receptor antibodies have come back positive.   ED Course:  The patient underwent an MRI which was not suggestive of a stroke. As noted above myasthenia gravis diagnosis was confirmed and patient was seen by neurology.  ROS:   ROS   Review of Systems: General: No fever, chills, weight changes Skin: No rashes, lesions, wounds Eyes: no discharge, redness, pain HENT: no ear  pain, hearing loss, drainage, tinnitus Endocrine: no heat/cold intolerance, no polyuria Cardiovascular: No palpitations, chest pain GI: No nausea, vomiting, diarrhea, constipation GU: No dysuria, increased frequency CNS: No numbness, dizziness, headache Blood/lymphatics: No easy bruising, bleeding Mood/affect: No anxiety/depression    Past Medical History:   Past Medical History:  Diagnosis Date  . BPH with urinary obstruction   . GERD (gastroesophageal reflux disease)    ESOPHAGEAL REFLUX HX  . History of balanitis   . History of phimosis   . History of repair of rotator cuff   . Hx of biopsy    PENIS CUTANEOUS HX  . Hypercholesteremia   . Hypertension   . Macular hole    AIR FLUID EXCHANGE   . Organic impotence   . Prostate cancer [facility])     Past Surgical History:   Past Surgical History:  Procedure Laterality Date  . APPENDECTOMY    . PARS PLANA VITRECTOMY W/ REPAIR OF MACULAR HOLE     x3  . ROTATOR CUFF REPAIR Right   . UVULOPLASTY     LASER ASSISTED    Social History:   Social History   Socioeconomic History  . Marital status: Married    Spouse name: Not on file  . Number of children: 2  . Years of education: Not on file  . Highest education level: Not on file  Occupational History    Comment: accountant, semi-retired  Social Needs  . Financial resource strain: Not on file  . Food insecurity:    Worry: Not on file    Inability: Not on file  .  Transportation needs:    Medical: Not on file    Non-medical: Not on file  Tobacco Use  . Smoking status: Never Smoker  . Smokeless tobacco: Never Used  Substance and Sexual Activity  . Alcohol use: Yes    Alcohol/week: 0.0 standard drinks    Comment: 2 oz gin weekly  . Drug use: No  . Sexual activity: Not on file  Lifestyle  . Physical activity:    Days per week: Not on file    Minutes per session: Not on file  . Stress: Not on file  Relationships  . Social connections:    Talks on phone: Not on  file    Gets together: Not on file    Attends religious service: Not on file    Active member of club or organization: Not on file    Attends meetings of clubs or organizations: Not on file    Relationship status: Not on file  . Intimate partner violence:    Fear of current or ex partner: Not on file    Emotionally abused: Not on file    Physically abused: Not on file    Forced sexual activity: Not on file  Other Topics Concern  . Not on file  Social History Narrative  . Not on file    Allergies   Patient has no known allergies.  Family history:   Family History  Problem Relation Age of Onset  . Dementia Father   . Colon cancer Brother        mets to liver   . Liver cancer Brother   . Heart attack Brother        MI at age 76  . Healthy Son   . Healthy Daughter     Current Medications:   Prior to Admission medications   Medication Sig Start Date End Date Taking? Authorizing Provider  Ascorbic Acid (VITAMIN C) 100 MG tablet Take 100 mg by mouth daily.   Yes [provider]  aspirin 81 MG tablet Take 81 mg by mouth daily.     Yes [provider]  atorvastatin (LIPITOR) 20 MG tablet Take 20 mg by mouth daily.   Yes [provider]  losartan-hydrochlorothiazide (HYZAAR) 100-25 MG tablet Take 1 tablet by mouth daily. 08/26/15  Yes Lelon Perla, MD  metoprolol succinate (TOPROL-XL) 50 MG 24 hr tablet Take 1 tablet (50 mg total) by mouth daily. NEED OV. Patient taking differently: Take 50 mg by mouth daily.  03/09/17  Yes Lelon Perla, MD  Multiple Vitamin (MULTIVITAMINS PO) Take 1 tablet by mouth daily. CENTRUM SILVER    Yes [provider]  omeprazole (PRILOSEC) 20 MG capsule Take 20 mg by mouth daily.     Yes [provider]  oxybutynin (DITROPAN-XL) 5 MG 24 hr tablet Take 5 mg by mouth 3 (three) times daily.    Yes [provider]  predniSONE (DELTASONE) 10 MG tablet 20 mg po Q daily x 6 days then 30 mg PO Q  daily for 7 days then 40 mg Po Q daily for one month Patient taking differently: Take 10 mg by mouth 2 (two) times daily.  03/01/18  Yes Pokhrel, Laxman, MD  pyridostigmine (MESTINON) 60 MG tablet Take 1 tablet (60 mg total) by mouth 3 (three) times daily. 03/01/18  Yes Pokhrel, Laxman, MD  vitamin E 400 UNIT capsule Take 400 Units by mouth daily.     Yes [provider]    Physical Exam:  Vitals:   03/06/18 1030 03/06/18 1100 03/06/18 1345 03/06/18 1500  BP:  138/63    Pulse: (!) 56 (!) 52 85 (!) 52  Resp: 13 17 (!) 21 20  Temp:      TempSrc:      SpO2: 96% 97% 98% 97%     Physical Exam: Blood pressure 138/63, pulse (!) 52, temperature 97.6 F (36.4 C), temperature source Oral, resp. rate 20, SpO2 97 %. Gen: somewhat tired appearing man sitting up at 40 in no acute respiratory distress breathing room air. Eyes: Sclerae anicteric. Conjunctiva mildly injected. Neck: Supple, no jugular venous distention. Chest: Moderately good air entry bilaterally with no adventitious sounds.  CV: Distant, regular, no audible murmurs. Abdomen: NABS, soft, nondistended, nontender. No tenderness to light or deep palpation. No rebound, no guarding. Extremities: No edema.  Skin: Warm and dry. No rashes, lesions or wounds. Neuro: Alert and oriented times 3; grossly nonfocal. Psych: Patient is cooperative, logical and coherent with appropriate mood and affect.  Data Review:    Labs: Basic Metabolic Panel: Recent Labs  Lab 02/28/18 0318 02/28/18 2054 03/01/18 0257 03/06/18 1009  NA 140  --  139 141  K 3.3*  --  4.3 3.9  CL 102  --  106 103  CO2 30  --  25 26  GLUCOSE 113*  --  131* 90  BUN 22  --  17 23  CREATININE 0.92 0.94 0.86 1.12  CALCIUM 9.3  --  8.9 9.5   Liver Function Tests: Recent Labs  Lab 02/28/18 0318 03/01/18 0257 03/06/18 1009  AST 24 26 27   ALT 29 26 27   ALKPHOS 62 55 65  BILITOT 0.6 1.1 1.3*  PROT 7.4 6.2* 7.0  ALBUMIN 4.5 3.7 4.3   No results for  input(s): LIPASE, AMYLASE in the last 168 hours. No results for input(s): AMMONIA in the last 168 hours. CBC: Recent Labs  Lab 02/28/18 0318 02/28/18 2054 03/01/18 0257 03/06/18 1009  WBC 7.6 9.7 9.0 11.7*  NEUTROABS 4.5  --   --  7.7  HGB 14.6 14.5 13.9 16.3  HCT 44.3 43.8 42.0 49.3  MCV 97.1 96.3 97.0 95.5  PLT 211 173 200 245   Cardiac Enzymes: Recent Labs  Lab 03/01/18 1349  CKTOTAL 103    BNP (last 3 results) No results for input(s): PROBNP in the last 8760 hours. CBG: Recent Labs  Lab 03/06/18 1020  GLUCAP 90    Urinalysis    Component Value Date/Time   COLORURINE YELLOW 02/28/2018 0318   APPEARANCEUR CLEAR 02/28/2018 0318   LABSPEC 1.024 02/28/2018 0318   PHURINE 5.0 02/28/2018 0318   GLUCOSEU NEGATIVE 02/28/2018 0318   HGBUR NEGATIVE 02/28/2018 0318   BILIRUBINUR NEGATIVE 02/28/2018 0318   KETONESUR NEGATIVE 02/28/2018 0318   PROTEINUR NEGATIVE 02/28/2018 0318   UROBILINOGEN 0.2 08/23/2014 2359   NITRITE NEGATIVE 02/28/2018 0318   LEUKOCYTESUR NEGATIVE 02/28/2018 0318      Radiographic Studies: Dg Chest Port 1 View  Result Date: 03/06/2018 CLINICAL DATA:  Weakness and dysphagia. EXAM: PORTABLE CHEST 1 VIEW COMPARISON:  08/24/2014 FINDINGS: The heart size and mediastinal contours are within normal limits. Stable bibasilar pulmonary scarring. There is no evidence of pulmonary edema, consolidation, pneumothorax, nodule or pleural fluid. The visualized skeletal structures are unremarkable. IMPRESSION: No active disease. Electronically Signed   By: Aletta Edouard M.D.   On: 03/06/2018 14:21    EKG: Independently reviewed. EKG shows sinus rhythm at 75. Normal intervals. Normal axis. No acute  ST-T wave changes. Essentially flat T waves in the limb leads.   Assessment/Plan:   Principal Problem:   Myasthenia gravis ([place]) Active Problems:   Sinus bradycardia   TIA (transient ischemic attack)   Gastro-esophageal reflux disease without esophagitis    Essential (primary) hypertension   Dysphagia  78 year old man with recent diagnosis of myasthenia gravis presents with suddenly worsened dysphagia this morning. NG tube placement compensated by possible aspiration earlier today although patient is breathing well on room air at present. Patient is now admitted for treatment of myasthenia gravis.  MYASTHENIA GRAVIS Myasthenia gravis treatment per neurology with Mestinon and IVIG. I'm continuing patient's slow taper of prednisone as I do not want to discontinue it suddenly.  DYSPHAGIA Will request IR to place NG tube under fluoroscopy Dietary consult placed to assist with how best to meet caloric and nutritional needs pending definitive treatment of MG.  RULE OUT ASPIRATION Will need to follow patient's respiratory status closely given possible aspiration event during placement of NG tube earlier today. At present lungs are clear and patient is oxygenating well  HYPERTENSION Continue home medications of Hyzaar and Toprol-XL  HISTORY OF TIA Continue aspirin   Patient is DO NOT RESUSCITATE    Other information:   DVT prophylaxis: Lovenox ordered. Code Status: Full code. Family Communication: patient's wife was present throughout   Disposition Plan: home Consults called: neurology Admission status: inpatient   The medical decision making on this patient was of high complexity and the patient is at high risk for clinical deterioration, therefore this is a level 3 visit.   Dewaine Oats Tublu Princetta Uplinger [facility]  If 7PM-7AM, please contact night-coverage [email or web address] Password [facility] 03/06/2018, 3:37 PM

## 2018-03-06 NOTE — ED Notes (Signed)
Pt now calm,  Breathing unlabored. VSS.

## 2018-03-07 ENCOUNTER — Inpatient Hospital Stay (HOSPITAL_COMMUNITY): Payer: Medicare Other

## 2018-03-07 ENCOUNTER — Encounter (HOSPITAL_COMMUNITY): Payer: Self-pay | Admitting: General Practice

## 2018-03-07 ENCOUNTER — Other Ambulatory Visit: Payer: Self-pay

## 2018-03-07 LAB — CBC
HEMATOCRIT: 40.7 % (ref 39.0–52.0)
Hemoglobin: 13.6 g/dL (ref 13.0–17.0)
MCH: 31.6 pg (ref 26.0–34.0)
MCHC: 33.4 g/dL (ref 30.0–36.0)
MCV: 94.7 fL (ref 80.0–100.0)
Platelets: 178 10*3/uL (ref 150–400)
RBC: 4.3 MIL/uL (ref 4.22–5.81)
RDW: 11.7 % (ref 11.5–15.5)
WBC: 8.7 10*3/uL (ref 4.0–10.5)
nRBC: 0 % (ref 0.0–0.2)

## 2018-03-07 LAB — BASIC METABOLIC PANEL
Anion gap: 8 (ref 5–15)
BUN: 22 mg/dL (ref 8–23)
CO2: 26 mmol/L (ref 22–32)
Calcium: 8.6 mg/dL — ABNORMAL LOW (ref 8.9–10.3)
Chloride: 104 mmol/L (ref 98–111)
Creatinine, Ser: 0.91 mg/dL (ref 0.61–1.24)
GFR calc Af Amer: >60 mL/min (ref >60)
GFR calc non Af Amer: >60 mL/min (ref >60)
Glucose, Bld: 89 mg/dL (ref 70–99)
Potassium: 3.3 mmol/L — ABNORMAL LOW (ref 3.5–5.1)
Sodium: 138 mmol/L (ref 135–145)

## 2018-03-07 LAB — GLUCOSE, CAPILLARY
GLUCOSE-CAPILLARY: 94 mg/dL (ref 70–99)
Glucose-Capillary: 122 mg/dL — ABNORMAL HIGH (ref 70–99)
Glucose-Capillary: 126 mg/dL — ABNORMAL HIGH (ref 70–99)

## 2018-03-07 MED ORDER — PRO-STAT SUGAR FREE PO LIQD
30.0000 mL | Freq: Every day | ORAL | Status: DC
  Administered 2018-03-07 – 2018-03-13 (×7): 30 mL
  Filled 2018-03-07 (×8): qty 30

## 2018-03-07 MED ORDER — JEVITY 1.2 CAL PO LIQD: 1000.0000 mL | ORAL | Status: DC

## 2018-03-07 MED ORDER — LIDOCAINE VISCOUS HCL 2 % MT SOLN
OROMUCOSAL | Status: AC
  Administered 2018-03-07: 5 mL via OROMUCOSAL
  Filled 2018-03-07: qty 15

## 2018-03-07 MED ORDER — LIDOCAINE VISCOUS HCL 2 % MT SOLN
15.0000 mL | Freq: Once | OROMUCOSAL | Status: AC
  Administered 2018-03-07: 5 mL via OROMUCOSAL
  Filled 2018-03-07: qty 15

## 2018-03-07 MED ORDER — ENSURE ENLIVE PO LIQD: 237.0000 mL | Freq: Two times a day (BID) | ORAL | Status: DC

## 2018-03-07 MED ORDER — IOPAMIDOL (ISOVUE-300) INJECTION 61%
INTRAVENOUS | Status: AC
  Filled 2018-03-07: qty 50

## 2018-03-07 MED ORDER — IOPAMIDOL (ISOVUE-300) INJECTION 61%
50.0000 mL | Freq: Once | INTRAVENOUS | Status: AC | PRN
  Administered 2018-03-07: 20 mL

## 2018-03-07 MED ORDER — OSMOLITE 1.5 CAL PO LIQD
1000.0000 mL | ORAL | Status: DC
  Administered 2018-03-07 – 2018-03-11 (×5): 1000 mL
  Filled 2018-03-07 (×8): qty 1000

## 2018-03-07 MED ORDER — POTASSIUM CHLORIDE 10 MEQ/100ML IV SOLN
10.0000 meq | INTRAVENOUS | Status: AC
  Administered 2018-03-07 (×4): 10 meq via INTRAVENOUS
  Filled 2018-03-07 (×4): qty 100

## 2018-03-07 NOTE — Progress Notes (Addendum)
NEUROLOGY PROGRESS NOTE  Subjective: Patient feels as though his speech is improved slightly.  Exam: Vitals:   03/07/18 0523 03/07/18 0749  BP: (!) 143/68 (!) 149/66  Pulse: (!) 52 (!) 49  Resp: 20 18  Temp: (!) 97.4 F (36.3 C) 98.2 F (36.8 C)  SpO2: 94% 95%    Physical Exam   HEENT-  Normocephalic, no lesions, without obvious abnormality.  Normal external eye and conjunctiva.  Extremities- Warm, dry and intact Musculoskeletal-no joint tenderness, deformity or swelling Skin-warm and dry, no hyperpigmentation, vitiligo, or suspicious lesions    Neuro:  Mental Status: Alert, oriented, thought content appropriate.  Speech dysarthric without evidence of aphasia.  Able to follow 3 step commands without difficulty. Cranial Nerves: II:  Visual fields grossly normal, continues to have diplopia III,IV, VI: ptosis not present, extra-ocular motions intact bilaterally pupils equal, round, reactive to light and accommodation V,VII: smile symmetric, facial light touch sensation normal bilaterally VIII: hearing normal bilaterally IX,X: uvula rises midline XI: bilateral shoulder shrug XII: midline tongue extension Motor: Right : Upper extremity   5/5    Left:     Upper extremity   5/5  Lower extremity   5/5     Lower extremity   5/5 Tone and bulk:normal tone throughout; no atrophy noted Sensory: Pinprick and light touch intact throughout, bilaterally Deep Tendon Reflexes: 2+ and symmetric throughout Plantars: Right: downgoing   Left: downgoing Cerebellar: normal finger-to-nose, normal rapid alternating movements and normal heel-to-shin test     Medications:  Scheduled: . aspirin  81 mg Per NG tube Daily  . atorvastatin  20 mg Per NG tube Daily  . enoxaparin (LOVENOX) injection  40 mg Subcutaneous Q24H  . feeding supplement (ENSURE ENLIVE)  237 mL Oral BID BM  . hydrochlorothiazide  25 mg Oral Daily  . Immune Globulin 10%  400 mg/kg Intravenous Q24 Hr x 5  . iopamidol       . losartan  100 mg Oral Daily  . metoprolol succinate  50 mg Oral Daily  . oxybutynin  5 mg Oral TID  . pantoprazole  40 mg Oral Daily  . predniSONE  20 mg Oral Q breakfast  . pyridostigmine  60 mg Oral Q8H  . sodium chloride flush  3 mL Intravenous Once    Pertinent Labs/Diagnostics: NIF -35, vital capacity 2.8  Ct Chest W Contrast  Result Date: 03/06/2018  IMPRESSION: Moderate circumferential wall thickening is seen involving the superior portion of the thoracic esophagus; it is uncertain if this is inflammatory or neoplastic in origin. Endoscopy is recommended for further evaluation. Aortic Atherosclerosis (ICD10-I70.0). Thyroid gland and trachea demonstrate no significant findings.Electronically Signed   By: Marijo Conception, M.D.   On: 03/06/2018 18:44   Dg Chest Port 1 View IMPRESSION: No active disease. Electronically Signed   By: Aletta Edouard M.D.   On: 03/06/2018 14:21   Dg Swallowing Func-speech Pathology  Result Date: 03/07/2018  10:03 AM              Pt presents with severe pharyngeal dysphagia characterized by reduced hyolaryngeal elevation and excursion which resulted in absent epiglottic retroversion, vallecular residue, pyriform sinus residue, and consistent penetration with all trials. He also demonstrated pharyngeal wall residue secondary to reduced pharyngeal constriction, and limited relaxation of the cricopharyngeus which resulted in pyriform sinus retention. Penetration was to the level of vocal folds and was inconsistently expelled with coughing but further penetration was noted with secondary  swallows.  Assessment:  78 year old male with myasthenia gravis.  Patient presents with exacerbation of myasthenia in the form of worsening dysarthria and dysphasia.  CT of chest does not show any thyroid gland abnormalities at this time.   Recommendations: -Mestinon 60 mg 3 times daily per tube -IVIG 400 mg/kilogram/day x5 days for a total of 2  g/kilogram Once he begins to respond to IVIG, will increase his prednisone dose however we will continue the 20 mg/day for now. -Patient has received 1 of 5 doses of IVIG at this point time   Etta Quill PA-C [facility] [phone]  03/07/2018, 11:43 AM   I have seen the patient and reviewed the above note.  CT chest reveals some thickening of the distal esophagus, which he states was already noted on endoscopy 3 weeks ago.  He will need to continue IVIG and Mestinon and prednisone.  Roland Rack, MD [facility] [phone]  If 7pm- 7am, please page neurology on call as listed in [facility].

## 2018-03-07 NOTE — Progress Notes (Signed)
RT Note:  NIF was greater than -20 with a good effort.  VC was 2.2L, patient did okay but said he had trouble making a good seal tonight.

## 2018-03-07 NOTE — Progress Notes (Signed)
NIF -35, VC 2.8L with good effort

## 2018-03-07 NOTE — Progress Notes (Signed)
Initial Nutrition Assessment  DOCUMENTATION CODES:   Not applicable  INTERVENTION:  Initiate Osmolite 1.5 @ 25 ml/hr via Cortrak NGT and increase by 10 ml every 4 hours to goal rate of 50 ml/hr.   30 ml Prostat once daily per tube.    Tube feeding regimen provides 1900 kcal (100% of needs), 90 grams of protein, and 912 ml of H2O.   Once IV fluids are discontinued, recommend free water flushes of 200 ml QID.   NUTRITION DIAGNOSIS:   Inadequate oral intake related to inability to eat, dysphagia as evidenced by NPO status.  GOAL:   Patient will meet greater than or equal to 90% of their needs  MONITOR:   TF tolerance, Labs, Skin, Weight trends, I & O's, Diet advancement  REASON FOR ASSESSMENT:   Consult, Malnutrition Screening Tool Assessment of nutrition requirement/status  ASSESSMENT:   78 year old male with past medical history significant for hypertension, GERD, prostate cancer and essential tremor who has developed diplopia and dysphagia over the past 6 weeks. Patient presents with exacerbation of myasthenia gravis.  MBS done this AM. Pt with severe pharyngeal dysphagia. SLP recommends NPO with temporary alternative means of nutrition. Cortrak NGT placed today for enteral nutrition.  RD to put in tube feeding orders. Pt reports dysphagia gradually worsening over the past 2 months. Pt reports he has still be eating well, however foods have to be very soft and chewing and swallowing is a very long process. Pt reports each meal consists of a vegetable, starch, and protein. Pt reports usual body weight of ~200 lbs, which he reports last weighed 2 months ago. Pt with a reported 5.5% weight loss in 2 months, not found significant for time frame. Labs and medications reviewed.   NUTRITION - FOCUSED PHYSICAL EXAM:    Most Recent Value  Orbital Region  No depletion  Upper Arm Region  No depletion  Thoracic and Lumbar Region  No depletion  Buccal Region  No depletion  Temple  Region  No depletion  Clavicle Bone Region  No depletion  Clavicle and Acromion Bone Region  No depletion  Scapular Bone Region  No depletion  Dorsal Hand  No depletion  Patellar Region  No depletion  Anterior Thigh Region  No depletion  Posterior Calf Region  No depletion  Edema (RD Assessment)  None  Hair  Reviewed  Eyes  Reviewed  Mouth  Reviewed  Skin  Reviewed  Nails  Reviewed       Diet Order:   Diet Order            Diet NPO time specified  Diet effective now              EDUCATION NEEDS:   Not appropriate for education at this time  Skin:  Skin Assessment: Reviewed RN Assessment  Last BM:  2/19  Height:   Ht Readings from Last 1 Encounters:  02/28/18 5\' 8"  (1.727 m)    Weight:   Wt Readings from Last 1 Encounters:  02/28/18 86.1 kg    Ideal Body Weight:  70 kg  BMI:  There is no height or weight on file to calculate BMI.  Estimated Nutritional Needs:   Kcal:  1850-2100  Protein:  90-105 grams  Fluid:  1.8 - 2.1 L/day    Corrin Parker, MS, RD, LDN Pager # [phone] After hours/ weekend pager # [phone]

## 2018-03-07 NOTE — Progress Notes (Signed)
PROGRESS NOTE    Travis Morris  OTL:[id number] DOB: 05-06-1940 DOA: 03/06/2018 PCP: London Pepper, MD  Outpatient Specialists:   Brief Narrative: Travis Morris is an 78 year old male with past medical history significant for hypertension, GERD, prostate cancer and essential tremor who has developed diplopia and dysphagia over the past 6 weeks. He was recently discharged last week from [facility] where he was worked up for myasthenia gravis and started on prednisone and Mestinon pending results of ACTH receptor antibodies. The ACTH receptor antibodies have come back positive.  Patient had been doing reasonably well until the morning of admission when he noted that he suddenly developed a complete inability to swallow.  Patient also reported about 25 pound weight loss.  In the ED patient was evaluated by neurology who recommended placement of NG tube so he could take the Mestinon. Unfortunately NG tube placement was eventful with possible placement in his trachea. Patient had episode of significant desaturation to the mid 50s with coughing and oxygen requirement thereafter.  The plan is for the interventional radiology to place a cortrak tube (NG tube).  Neurology team is directing care.  Plan is for steroids, Mestinon as well as IVIG.   Assessment & Plan:   Principal Problem:   Myasthenia gravis ([place]) Active Problems:   Sinus bradycardia   TIA (transient ischemic attack)   Gastro-esophageal reflux disease without esophagitis   Essential (primary) hypertension   Dysphagia  Myasthenia gravis:  Continue steroids, Mestinon and IVIG. Supportive care. Rule out thymoma as planned by neurology team. Further management depend on hospital course.  DYSPHAGIA: Likely related to myasthenia gravis NG tube. Dietitian input is appreciated.  Hypokalemia: Potassium is 3.3 today. Replete and continue to monitor.  RULE OUT ASPIRATION: Will need to follow patient's respiratory status closely  given possible aspiration event during placement of NG tube earlier today. At present lungs are clear and patient is oxygenating well 03/07/2018: Doubt aspiration.  Continue to monitor.  HYPERTENSION: Continue home medications of Hyzaar and Toprol-XL  HISTORY OF TIA: Continue aspirin  DVT prophylaxis: Subcu Lovenox Code Status: Full code Family Communication:  Disposition Plan: Will depend on hospital course   Consultants:   Neurology  Procedures:   None  Antimicrobials:   None   Subjective: Dysphagia and dysarthria persist.  Objective: Vitals:   03/06/18 2342 03/07/18 0010 03/07/18 0523 03/07/18 0749  BP: (!) 162/65 (!) 158/68 (!) 143/68 (!) 149/66  Pulse:   (!) 52 (!) 49  Resp: 17 18 20 18   Temp:   (!) 97.4 F (36.3 C) 98.2 F (36.8 C)  TempSrc:   Oral Oral  SpO2: 96% 95% 94% 95%    Intake/Output Summary (Last 24 hours) at 03/07/2018 0902 Last data filed at 03/07/2018 0010 Gross per 24 hour  Intake 740.6 ml  Output -  Net 740.6 ml   There were no vitals filed for this visit.  Examination:  General exam: Appears calm and comfortable.  Respiratory system: Clear to auscultation.  Cardiovascular system: S1 & S2 heard. No pedal edema. Gastrointestinal system: Abdomen is nondistended, soft and nontender. No organomegaly or masses felt. Normal bowel sounds heard. Central nervous system: Alert and oriented.  Full range of movement.  Dysarthria noted.  Dysphagia reported.  Patient moves all limbs. Extremities: No leg edema.    Data Reviewed: I have personally reviewed following labs and imaging studies  CBC: Recent Labs  Lab 02/28/18 2054 03/01/18 0257 03/06/18 1009 03/06/18 1707 03/07/18 0402  WBC 9.7  9.0 11.7* 10.8* 8.7  NEUTROABS  --   --  7.7  --   --   HGB 14.5 13.9 16.3 14.9 13.6  HCT 43.8 42.0 49.3 44.5 40.7  MCV 96.3 97.0 95.5 93.9 94.7  PLT 173 200 245 217 811   Basic Metabolic Panel: Recent Labs  Lab 02/28/18 2054 03/01/18 0257  03/06/18 1009 03/06/18 1707 03/07/18 0402  NA  --  139 141  --  138  K  --  4.3 3.9  --  3.3*  CL  --  106 103  --  104  CO2  --  25 26  --  26  GLUCOSE  --  131* 90  --  89  BUN  --  17 23  --  22  CREATININE 0.94 0.86 1.12 1.13 0.91  CALCIUM  --  8.9 9.5  --  8.6*   GFR: Estimated Creatinine Clearance: 70.3 mL/min (by C-G formula based on SCr of 0.91 mg/dL). Liver Function Tests: Recent Labs  Lab 03/01/18 0257 03/06/18 1009  AST 26 27  ALT 26 27  ALKPHOS 55 65  BILITOT 1.1 1.3*  PROT 6.2* 7.0  ALBUMIN 3.7 4.3   No results for input(s): LIPASE, AMYLASE in the last 168 hours. No results for input(s): AMMONIA in the last 168 hours. Coagulation Profile: Recent Labs  Lab 03/06/18 1009  INR 1.02   Cardiac Enzymes: Recent Labs  Lab 03/01/18 1349  CKTOTAL 103   BNP (last 3 results) No results for input(s): PROBNP in the last 8760 hours. HbA1C: No results for input(s): HGBA1C in the last 72 hours. CBG: Recent Labs  Lab 03/06/18 1020  GLUCAP 90   Lipid Profile: No results for input(s): CHOL, HDL, LDLCALC, TRIG, CHOLHDL, LDLDIRECT in the last 72 hours. Thyroid Function Tests: No results for input(s): TSH, T4TOTAL, FREET4, T3FREE, THYROIDAB in the last 72 hours. Anemia Panel: No results for input(s): VITAMINB12, FOLATE, FERRITIN, TIBC, IRON, RETICCTPCT in the last 72 hours. Urine analysis:    Component Value Date/Time   COLORURINE YELLOW 02/28/2018 0318   APPEARANCEUR CLEAR 02/28/2018 0318   LABSPEC 1.024 02/28/2018 0318   PHURINE 5.0 02/28/2018 0318   GLUCOSEU NEGATIVE 02/28/2018 0318   HGBUR NEGATIVE 02/28/2018 0318   BILIRUBINUR NEGATIVE 02/28/2018 0318   KETONESUR NEGATIVE 02/28/2018 0318   PROTEINUR NEGATIVE 02/28/2018 0318   UROBILINOGEN 0.2 08/23/2014 2359   NITRITE NEGATIVE 02/28/2018 0318   LEUKOCYTESUR NEGATIVE 02/28/2018 0318   Sepsis Labs: @LABRCNTIP (procalcitonin:4,lacticidven:4)  ) Recent Results (from the past 240 hour(s))  MRSA PCR  Screening     Status: None   Collection Time: 02/28/18  8:53 PM  Result Value Ref Range Status   MRSA by PCR NEGATIVE NEGATIVE Final    Comment:        The GeneXpert MRSA Assay (FDA approved for NASAL specimens only), is one component of a comprehensive MRSA colonization surveillance program. It is not intended to diagnose MRSA infection nor to guide or monitor treatment for MRSA infections. Performed at [facility], [place] [address] [place], [place] [address]          Radiology Studies: Ct Chest W Contrast  Result Date: 03/06/2018 CLINICAL DATA:  Swallowing difficulty. EXAM: CT CHEST WITH CONTRAST TECHNIQUE: Multidetector CT imaging of the chest was performed during intravenous contrast administration. CONTRAST:  66mL OMNIPAQUE IOHEXOL 300 MG/ML  SOLN COMPARISON:  Radiograph of same day. FINDINGS: Cardiovascular: Atherosclerosis of thoracic aorta is noted without aneurysm or dissection. Normal cardiac size.  No pericardial effusion. Mediastinum/Nodes: No enlarged mediastinal, hilar, or axillary lymph nodes. Thyroid gland and trachea demonstrate no significant findings. There appears to be moderate wall thickening involving the superior thoracic esophagus. Lungs/Pleura: No pneumothorax or pleural effusion is noted. Mild bibasilar subsegmental atelectasis is noted. Upper Abdomen: No acute abnormality. Musculoskeletal: No chest wall abnormality. No acute or significant osseous findings. IMPRESSION: Moderate circumferential wall thickening is seen involving the superior portion of the thoracic esophagus; it is uncertain if this is inflammatory or neoplastic in origin. Endoscopy is recommended for further evaluation. Aortic Atherosclerosis (ICD10-I70.0). Electronically Signed   By: Marijo Conception, M.D.   On: 03/06/2018 18:44   Dg Chest Port 1 View  Result Date: 03/06/2018 CLINICAL DATA:  Weakness and dysphagia. EXAM: PORTABLE CHEST 1 VIEW COMPARISON:  08/24/2014 FINDINGS:  The heart size and mediastinal contours are within normal limits. Stable bibasilar pulmonary scarring. There is no evidence of pulmonary edema, consolidation, pneumothorax, nodule or pleural fluid. The visualized skeletal structures are unremarkable. IMPRESSION: No active disease. Electronically Signed   By: Aletta Edouard M.D.   On: 03/06/2018 14:21        Scheduled Meds: . aspirin  81 mg Per NG tube Daily  . atorvastatin  20 mg Per NG tube Daily  . enoxaparin (LOVENOX) injection  40 mg Subcutaneous Q24H  . hydrochlorothiazide  25 mg Oral Daily  . Immune Globulin 10%  400 mg/kg Intravenous Q24 Hr x 5  . losartan  100 mg Oral Daily  . metoprolol succinate  50 mg Oral Daily  . oxybutynin  5 mg Oral TID  . pantoprazole  40 mg Oral Daily  . predniSONE  20 mg Oral Q breakfast  . pyridostigmine  60 mg Oral Q8H  . sodium chloride flush  3 mL Intravenous Once   Continuous Infusions: . sodium chloride 100 mL/hr at 03/07/18 0618     LOS: 1 day    Time spent: 35 Minutes.   Dana Allan, MD  [facility] Pager #: [phone] 7PM-7AM contact night coverage as above

## 2018-03-07 NOTE — Progress Notes (Signed)
Pharmacy notified of pt's IVIG due but unavailable; per pharmacy tech, medication no ready and pharmacist will notify RN when it's ready to be picked up. P. Angelica Pou RN

## 2018-03-07 NOTE — Progress Notes (Signed)
Cortrak Tube Team Note:  Cortrak team called by IR to place bridle on Cortrak tube. Bridle placed in pt's room and tube secured at 110 cm.    Gaynell Face, MS, RD, LDN Inpatient Clinical Dietitian Pager: [phone] Weekend/After Hours: [phone]

## 2018-03-07 NOTE — Progress Notes (Signed)
Modified Barium Swallow Progress Note  Patient Details  Name: Travis Morris MRN: [id number] Date of Birth: [birth year]  Today's Date: 03/07/2018  Modified Barium Swallow completed.  Full report located under Chart Review in the Imaging Section.  Brief recommendations include the following:  Clinical Impression  Pt presents with severe pharyngeal dysphagia characterized by reduced hyolaryngeal elevation and excursion which resulted in absent epiglottic retroversion, vallecular residue, pyriform sinus residue, and consistent penetration with all trials. He also demonstrated pharyngeal wall residue secondary to reduced pharyngeal constriction, and limited relaxation of the cricopharyngeus which resulted in pyriform sinus retention. Penetration was to the level of vocal folds and was inconsistently expelled with coughing but further penetration was noted with secondary  swallows. Pharyngeal residue was reduced but not eliminated with prompted coughing and suctioning. Pt is at high risk of aspiration during and after deglutition due to reduced airway protection and pharyngeal residue. It is recommended that short-term non-oral nutrition (e.g., Cortrak) be initiated at this time and SLP will follow pt to assess improvement as he receives IVIG and Mestinon. Pt, Dr. Marthenia Rolling, and his nurse have all been educated regarding results and recommendations.    Swallow Evaluation Recommendations       SLP Diet Recommendations: NPO;Alternative means - temporary       Medication Administration: Via alternative means           Postural Changes: Remain semi-upright after after feeds/meals (Comment);Seated upright at 90 degrees   Oral Care Recommendations: Oral care QID      Amram Maya I. Hardin Negus, [place], [place] Office number [phone] Pager [phone]  Horton Marshall 03/07/2018,9:59 AM

## 2018-03-08 LAB — ACETYLCHOLINE RECEPTOR AB, ALL
Acety choline binding ab: <0.03 nmol/L (ref 0.00–0.24)
Acetylchol Block Ab: 60 % — ABNORMAL HIGH (ref 0–25)
Acetylcholine Modulat Ab: 63 % — ABNORMAL HIGH (ref 0–20)

## 2018-03-08 LAB — GLUCOSE, CAPILLARY
GLUCOSE-CAPILLARY: 129 mg/dL — AB (ref 70–99)
Glucose-Capillary: 120 mg/dL — ABNORMAL HIGH (ref 70–99)
Glucose-Capillary: 128 mg/dL — ABNORMAL HIGH (ref 70–99)
Glucose-Capillary: 134 mg/dL — ABNORMAL HIGH (ref 70–99)
Glucose-Capillary: 180 mg/dL — ABNORMAL HIGH (ref 70–99)

## 2018-03-08 LAB — CBC WITH DIFFERENTIAL/PLATELET
Abs Immature Granulocytes: 0.02 10*3/uL (ref 0.00–0.07)
Basophils Absolute: 0 10*3/uL (ref 0.0–0.1)
Basophils Relative: 0 %
Eosinophils Absolute: 0.3 10*3/uL (ref 0.0–0.5)
Eosinophils Relative: 4 %
HCT: 40.5 % (ref 39.0–52.0)
Hemoglobin: 14 g/dL (ref 13.0–17.0)
Immature Granulocytes: 0 %
Lymphocytes Relative: 20 %
Lymphs Abs: 1.4 10*3/uL (ref 0.7–4.0)
MCH: 32.3 pg (ref 26.0–34.0)
MCHC: 34.6 g/dL (ref 30.0–36.0)
MCV: 93.5 fL (ref 80.0–100.0)
Monocytes Absolute: 0.7 10*3/uL (ref 0.1–1.0)
Monocytes Relative: 10 %
Neutro Abs: 4.7 10*3/uL (ref 1.7–7.7)
Neutrophils Relative %: 66 %
Platelets: 190 10*3/uL (ref 150–400)
RBC: 4.33 MIL/uL (ref 4.22–5.81)
RDW: 11.6 % (ref 11.5–15.5)
WBC: 7.1 10*3/uL (ref 4.0–10.5)
nRBC: 0 % (ref 0.0–0.2)

## 2018-03-08 LAB — RENAL FUNCTION PANEL
Albumin: 3 g/dL — ABNORMAL LOW (ref 3.5–5.0)
Anion gap: 9 (ref 5–15)
BUN: 20 mg/dL (ref 8–23)
CO2: 23 mmol/L (ref 22–32)
Calcium: 8.5 mg/dL — ABNORMAL LOW (ref 8.9–10.3)
Chloride: 106 mmol/L (ref 98–111)
Creatinine, Ser: 0.99 mg/dL (ref 0.61–1.24)
GFR calc Af Amer: >60 mL/min (ref >60)
GFR calc non Af Amer: >60 mL/min (ref >60)
Glucose, Bld: 110 mg/dL — ABNORMAL HIGH (ref 70–99)
Phosphorus: 3.9 mg/dL (ref 2.5–4.6)
Potassium: 3.6 mmol/L (ref 3.5–5.1)
Sodium: 138 mmol/L (ref 135–145)

## 2018-03-08 LAB — MAGNESIUM: Magnesium: 2.1 mg/dL (ref 1.7–2.4)

## 2018-03-08 MED ORDER — OXYBUTYNIN CHLORIDE 5 MG PO TABS
5.0000 mg | ORAL_TABLET | Freq: Three times a day (TID) | ORAL | Status: DC
  Administered 2018-03-08 – 2018-03-13 (×15): 5 mg
  Filled 2018-03-08 (×15): qty 1

## 2018-03-08 NOTE — Progress Notes (Signed)
PROGRESS NOTE    DORA CLAUSS  KCL:[id number] DOB: [birth year] DOA: 03/06/2018 PCP: London Pepper, MD  Outpatient Specialists:   Brief Narrative: PAU BANH is an 78 year old male with past medical history significant for hypertension, GERD, prostate cancer and essential tremor who has developed diplopia and dysphagia over the past 6 weeks. He was recently discharged last week from [facility] where he was worked up for myasthenia gravis and started on prednisone and Mestinon pending results of ACTH receptor antibodies. The ACTH receptor antibodies have come back positive.  Patient had been doing reasonably well until the morning of admission when he noted that he suddenly developed a complete inability to swallow.  Patient also reported about 25 pound weight loss.  In the ED patient was evaluated by neurology who recommended placement of NG tube so he could take the Mestinon. Unfortunately NG tube placement was eventful with possible placement in his trachea. Patient had episode of significant desaturation to the mid 50s with coughing and oxygen requirement thereafter.  The plan is for the interventional radiology to place a cortrak tube (NG tube).  Neurology team is directing care.  Plan is for steroids, Mestinon as well as IVIG.  03/08/2018: Patient seen alongside patient's wife.  Patient reports some improvement in his speech.  Patient is still n.p.o.  P from neurology is highly appreciated.   Assessment & Plan:   Principal Problem:   Myasthenia gravis ([place]) Active Problems:   Sinus bradycardia   TIA (transient ischemic attack)   Gastro-esophageal reflux disease without esophagitis   Essential (primary) hypertension   Dysphagia  Myasthenia gravis:  Continue steroids, Mestinon and IVIG. Supportive care. Rule out thymoma as planned by neurology team. Further management depend on hospital course. 03/08/2018: Patient seen alongside patient's wife.  Patient reports some improvement  in his speech.  Patient is still n.p.o.  P from neurology is highly appreciated.  DYSPHAGIA: Likely related to myasthenia gravis NG tube placed. Dietitian input is appreciated.  Hypokalemia: Potassium is 3.3 today. Replete and continue to monitor.  RULE OUT ASPIRATION: Will need to follow patient's respiratory status closely given possible aspiration event during placement of NG tube earlier today. At present lungs are clear and patient is oxygenating well 03/07/2018: Doubt aspiration.  Continue to monitor.  HYPERTENSION: Continue home medications of Hyzaar and Toprol-XL  HISTORY OF TIA: Continue aspirin  DVT prophylaxis: Subcu Lovenox Code Status: Full code Family Communication:  Disposition Plan: Will depend on hospital course   Consultants:   Neurology  Procedures:   None  Antimicrobials:   None   Subjective: Dysphagia and dysarthria persist, but dysarthria has improved significantly.  Objective: Vitals:   03/08/18 0403 03/08/18 0806 03/08/18 1152 03/08/18 1505  BP: 137/60 (!) 146/64 (!) 155/67 (!) 164/89  Pulse: (!) 51 (!) 53 (!) 57 64  Resp: 18 20 15 20   Temp: 97.7 F (36.5 C) 98.5 F (36.9 C) 97.9 F (36.6 C) 97.9 F (36.6 C)  TempSrc: Oral Oral Oral Oral  SpO2: 94% 95% 93% 95%  Weight:      Height:        Intake/Output Summary (Last 24 hours) at 03/08/2018 1645 Last data filed at 03/08/2018 1507 Gross per 24 hour  Intake 2711.67 ml  Output 1800 ml  Net 911.67 ml   Filed Weights   03/08/18 0200  Weight: 86 kg    Examination:  General exam: Appears calm and comfortable.  Respiratory system: Clear to auscultation.  Cardiovascular system: S1 &  S2 heard. No pedal edema. Gastrointestinal system: Abdomen is nondistended, soft and nontender. No organomegaly or masses felt. Normal bowel sounds heard. Central nervous system: Alert and oriented.  Full range of movement.  Dysarthria noted.  Dysphagia reported.  Patient moves all  limbs. Extremities: No leg edema.    Data Reviewed: I have personally reviewed following labs and imaging studies  CBC: Recent Labs  Lab 03/06/18 1009 03/06/18 1707 03/07/18 0402 03/08/18 0607  WBC 11.7* 10.8* 8.7 7.1  NEUTROABS 7.7  --   --  4.7  HGB 16.3 14.9 13.6 14.0  HCT 49.3 44.5 40.7 40.5  MCV 95.5 93.9 94.7 93.5  PLT 245 217 178 539   Basic Metabolic Panel: Recent Labs  Lab 03/06/18 1009 03/06/18 1707 03/07/18 0402 03/08/18 0607  NA 141  --  138 138  K 3.9  --  3.3* 3.6  CL 103  --  104 106  CO2 26  --  26 23  GLUCOSE 90  --  89 110*  BUN 23  --  22 20  CREATININE 1.12 1.13 0.91 0.99  CALCIUM 9.5  --  8.6* 8.5*  MG  --   --   --  2.1  PHOS  --   --   --  3.9   GFR: Estimated Creatinine Clearance: 64.5 mL/min (by C-G formula based on SCr of 0.99 mg/dL). Liver Function Tests: Recent Labs  Lab 03/06/18 1009 03/08/18 0607  AST 27  --   ALT 27  --   ALKPHOS 65  --   BILITOT 1.3*  --   PROT 7.0  --   ALBUMIN 4.3 3.0*   No results for input(s): LIPASE, AMYLASE in the last 168 hours. No results for input(s): AMMONIA in the last 168 hours. Coagulation Profile: Recent Labs  Lab 03/06/18 1009  INR 1.02   Cardiac Enzymes: No results for input(s): CKTOTAL, CKMB, CKMBINDEX, TROPONINI in the last 168 hours. BNP (last 3 results) No results for input(s): PROBNP in the last 8760 hours. HbA1C: No results for input(s): HGBA1C in the last 72 hours. CBG: Recent Labs  Lab 03/07/18 2242 03/07/18 2357 03/08/18 0405 03/08/18 0801 03/08/18 1150  GLUCAP 122* 126* 129* 128* 120*   Lipid Profile: No results for input(s): CHOL, HDL, LDLCALC, TRIG, CHOLHDL, LDLDIRECT in the last 72 hours. Thyroid Function Tests: No results for input(s): TSH, T4TOTAL, FREET4, T3FREE, THYROIDAB in the last 72 hours. Anemia Panel: No results for input(s): VITAMINB12, FOLATE, FERRITIN, TIBC, IRON, RETICCTPCT in the last 72 hours. Urine analysis:    Component Value Date/Time    COLORURINE YELLOW 02/28/2018 [place] 02/28/2018 0318   LABSPEC 1.024 02/28/2018 0318   PHURINE 5.0 02/28/2018 0318   GLUCOSEU NEGATIVE 02/28/2018 0318   HGBUR NEGATIVE 02/28/2018 0318   BILIRUBINUR NEGATIVE 02/28/2018 0318   KETONESUR NEGATIVE 02/28/2018 0318   PROTEINUR NEGATIVE 02/28/2018 0318   UROBILINOGEN 0.2 08/23/2014 2359   NITRITE NEGATIVE 02/28/2018 0318   LEUKOCYTESUR NEGATIVE 02/28/2018 0318   Sepsis Labs: @LABRCNTIP (procalcitonin:4,lacticidven:4)  ) Recent Results (from the past 240 hour(s))  MRSA PCR Screening     Status: None   Collection Time: 02/28/18  8:53 PM  Result Value Ref Range Status   MRSA by PCR NEGATIVE NEGATIVE Final    Comment:        The GeneXpert MRSA Assay (FDA approved for NASAL specimens only), is one component of a comprehensive MRSA colonization surveillance program. It is not intended to diagnose MRSA infection nor  to guide or monitor treatment for MRSA infections. Performed at [facility], [place] [address], [place], [place] [address]          Radiology Studies: Dg Abd 1 View  Result Date: 03/07/2018 CLINICAL DATA:  10 FR NASO-ENTERIC FEEDING TUBE PLACED VIA FLUORO (NO RAD) 2 MIN 30 SEC FLUORO 20 ML ISOVUE 300 USED EXAM: ABDOMEN - 1 VIEW COMPARISON:  None. FINDINGS: Feeding tube has been injected, tip within the region of the ligament of Treitz. Contrast injected flows into normal-appearing proximal jejunal loops. IMPRESSION: Successful feeding tube placement. Electronically Signed   By: Nolon Nations M.D.   On: 03/07/2018 11:44   Ct Chest W Contrast  Result Date: 03/06/2018 CLINICAL DATA:  Swallowing difficulty. EXAM: CT CHEST WITH CONTRAST TECHNIQUE: Multidetector CT imaging of the chest was performed during intravenous contrast administration. CONTRAST:  45mL OMNIPAQUE IOHEXOL 300 MG/ML  SOLN COMPARISON:  Radiograph of same day. FINDINGS: Cardiovascular: Atherosclerosis of thoracic aorta is  noted without aneurysm or dissection. Normal cardiac size. No pericardial effusion. Mediastinum/Nodes: No enlarged mediastinal, hilar, or axillary lymph nodes. Thyroid gland and trachea demonstrate no significant findings. There appears to be moderate wall thickening involving the superior thoracic esophagus. Lungs/Pleura: No pneumothorax or pleural effusion is noted. Mild bibasilar subsegmental atelectasis is noted. Upper Abdomen: No acute abnormality. Musculoskeletal: No chest wall abnormality. No acute or significant osseous findings. IMPRESSION: Moderate circumferential wall thickening is seen involving the superior portion of the thoracic esophagus; it is uncertain if this is inflammatory or neoplastic in origin. Endoscopy is recommended for further evaluation. Aortic Atherosclerosis (ICD10-I70.0). Electronically Signed   By: Marijo Conception, M.D.   On: 03/06/2018 18:44   Dg Swallowing Func-speech Pathology  Result Date: 03/07/2018 Objective Swallowing Evaluation: Type of Study: MBS-Modified Barium Swallow Study  Patient Details Name: DNAIEL VOLLER MRN: [id number] Date of Birth: 12/05/1940 Today's Date: 03/07/2018 Time: SLP Start Time (ACUTE ONLY): 8546 -SLP Stop Time (ACUTE ONLY): 0835 SLP Time Calculation (min) (ACUTE ONLY): 11 min Past Medical History: Past Medical History: Diagnosis Date . BPH with urinary obstruction  . GERD (gastroesophageal reflux disease)   ESOPHAGEAL REFLUX HX . History of balanitis  . History of phimosis  . History of repair of rotator cuff  . Hx of biopsy   PENIS CUTANEOUS HX . Hypercholesteremia  . Hypertension  . Macular hole   AIR FLUID EXCHANGE  . Organic impotence  . Prostate cancer [facility])  Past Surgical History: Past Surgical History: Procedure Laterality Date . APPENDECTOMY   . PARS PLANA VITRECTOMY W/ REPAIR OF MACULAR HOLE    x3 . ROTATOR CUFF REPAIR Right  . UVULOPLASTY    LASER ASSISTED HPI: Pt is an 78 y.o. male with past medical history significant for hypertension,  GERD, prostate cancer and essential tremor who has developed diplopia and dysphagia over the past 6 weeks. He was recently discharged last week from [facility] where he was worked up for myasthenia gravis and started on prednisone and Mestinon pending results of ACTH receptor antibodies. Patient had been doing reasonably well until the morning of 03/06/18 when he noted that he suddenly developed a complete inability to swallow. Patient indicated in ED that he tolerated dinner last night as long as he ate slowly however this morning he was unable to drink even more than 2-3 sips of coffee. He was evaluated by evaluated by neurology in the ED and NG tube was recommended so he could take the Mestinon. However, NG tube placement  was unsucessful with possible placement in his trachea. Patient had episode of significant desaturation to the mid 50s with coughing and oxygen requirement thereafter.  Subjective: Pt seen in radiology for outpatient MBS. Wife present Assessment / Plan / Recommendation CHL IP CLINICAL IMPRESSIONS 03/07/2018 Clinical Impression Pt presents with severe pharyngeal dysphagia characterized by reduced hyolaryngeal elevation and excursion which resulted in absent epiglottic retroversion, vallecular residue, pyriform sinus residue, and consistent penetration with all trials. He also demonstrated pharyngeal wall residue secondary to reduced pharyngeal constriction, and limited relaxation of the cricopharyngeus which resulted in pyriform sinus retention. Penetration was to the level of vocal folds and was inconsistently expelled with coughing but further penetration was noted with secondary  swallows. Pharyngeal residue was reduced but not eliminated with prompted coughing and suctioning. Pt is at high risk of aspiration during and after deglutition due to reduced airway protection and pharyngeal residue. It is recommended that short-term non-oral nutrition (e.g., Cortrak) be initiated at this time and SLP  will follow pt to assess improvement as he receives IVIG and Mestinon. Pt, Dr. Marthenia Rolling, and his nurse have all been educated regarding results and recommendations. SLP Visit Diagnosis Dysphagia, pharyngoesophageal phase (R13.14) Attention and concentration deficit following -- Frontal lobe and executive function deficit following -- Impact on safety and function Severe aspiration risk   CHL IP TREATMENT RECOMMENDATION 03/07/2018 Treatment Recommendations Therapy as outlined in treatment plan below   Prognosis 03/07/2018 Prognosis for Safe Diet Advancement Good Barriers to Reach Goals Severity of deficits Barriers/Prognosis Comment -- CHL IP DIET RECOMMENDATION 03/07/2018 SLP Diet Recommendations NPO;Alternative means - temporary Liquid Administration via -- Medication Administration Via alternative means Compensations -- Postural Changes Remain semi-upright after after feeds/meals (Comment);Seated upright at 90 degrees   CHL IP OTHER RECOMMENDATIONS 03/07/2018 Recommended Consults -- Oral Care Recommendations Oral care QID Other Recommendations --   CHL IP FOLLOW UP RECOMMENDATIONS 03/07/2018 Follow up Recommendations Outpatient SLP   CHL IP FREQUENCY AND DURATION 03/07/2018 Speech Therapy Frequency (ACUTE ONLY) min 2x/week Treatment Duration 2 weeks      CHL IP ORAL PHASE 03/07/2018 Oral Phase WFL Oral - Pudding Teaspoon -- Oral - Pudding Cup -- Oral - Honey Teaspoon -- Oral - Honey Cup -- Oral - Nectar Teaspoon -- Oral - Nectar Cup -- Oral - Nectar Straw -- Oral - Thin Teaspoon -- Oral - Thin Cup -- Oral - Thin Straw -- Oral - Puree -- Oral - Mech Soft -- Oral - Regular -- Oral - Multi-Consistency -- Oral - Pill -- Oral Phase - Comment --  CHL IP PHARYNGEAL PHASE 03/07/2018 Pharyngeal Phase Impaired Pharyngeal- Pudding Teaspoon -- Pharyngeal -- Pharyngeal- Pudding Cup -- Pharyngeal -- Pharyngeal- Honey Teaspoon -- Pharyngeal -- Pharyngeal- Honey Cup -- Pharyngeal -- Pharyngeal- Nectar Teaspoon -- Pharyngeal -- Pharyngeal-  Nectar Cup Reduced pharyngeal peristalsis;Reduced epiglottic inversion;Reduced anterior laryngeal mobility;Reduced laryngeal elevation;Penetration/Aspiration during swallow;Penetration/Apiration after swallow;Pharyngeal residue - valleculae;Pharyngeal residue - pyriform Pharyngeal Material enters airway, CONTACTS cords and not ejected out Pharyngeal- Nectar Straw -- Pharyngeal -- Pharyngeal- Thin Teaspoon Reduced pharyngeal peristalsis;Reduced epiglottic inversion;Reduced anterior laryngeal mobility;Reduced laryngeal elevation;Penetration/Aspiration during swallow;Penetration/Apiration after swallow;Pharyngeal residue - valleculae;Pharyngeal residue - pyriform Pharyngeal Material enters airway, CONTACTS cords and not ejected out Pharyngeal- Thin Cup -- Pharyngeal -- Pharyngeal- Thin Straw -- Pharyngeal -- Pharyngeal- Puree -- Pharyngeal -- Pharyngeal- Mechanical Soft -- Pharyngeal -- Pharyngeal- Regular -- Pharyngeal -- Pharyngeal- Multi-consistency -- Pharyngeal -- Pharyngeal- Pill -- Pharyngeal -- Pharyngeal Comment --  CHL IP CERVICAL ESOPHAGEAL PHASE 03/07/2018 Cervical Esophageal Phase  Impaired Pudding Teaspoon -- Pudding Cup -- Honey Teaspoon -- Honey Cup -- Nectar Teaspoon -- Nectar Cup Reduced cricopharyngeal relaxation Nectar Straw -- Thin Teaspoon Reduced cricopharyngeal relaxation Thin Cup -- Thin Straw -- Puree -- Mechanical Soft -- Regular -- Multi-consistency -- Pill -- Cervical Esophageal Comment -- Shanika I. Hardin Negus, [place], [place] Office number [phone] Pager [place] 03/07/2018, 10:03 AM                   Scheduled Meds: . aspirin  81 mg Per NG tube Daily  . atorvastatin  20 mg Per NG tube Daily  . enoxaparin (LOVENOX) injection  40 mg Subcutaneous Q24H  . feeding supplement (PRO-STAT SUGAR FREE 64)  30 mL Per Tube Daily  . hydrochlorothiazide  25 mg Oral Daily  . Immune Globulin 10%  400 mg/kg Intravenous Q24 Hr x 5  . losartan   100 mg Oral Daily  . metoprolol succinate  50 mg Oral Daily  . oxybutynin  5 mg Oral TID  . pantoprazole  40 mg Oral Daily  . predniSONE  20 mg Oral Q breakfast  . pyridostigmine  60 mg Oral Q8H  . sodium chloride flush  3 mL Intravenous Once   Continuous Infusions: . feeding supplement (OSMOLITE 1.5 CAL) 50 mL/hr at 03/08/18 0423     LOS: 2 days    Time spent: 25 Minutes.   Dana Allan, MD  [facility] Pager #: [phone] 7PM-7AM contact night coverage as above

## 2018-03-08 NOTE — Progress Notes (Signed)
  Speech Language Pathology Treatment: Dysphagia  Patient Details Name: Travis Morris MRN: [id number] DOB: 05/05/1940 Today's Date: 03/08/2018 Time: 0947-0962 SLP Time Calculation (min) (ACUTE ONLY): 12 min  Assessment / Plan / Recommendation Clinical Impression  Pt was seen for dysphagia treatment to assess improvement in swallow function. Pt was alert and reported that he believed his swallow mechanism feels stronger than yesterday but is not back to normal. He tolerated small ice chips without overt s/sx of aspiration but exhibited a wet vocal quality with larger ice chips and with thin liquids via tsp. Pt's wet vocal quality was alleviated with prompted coughing and secondary swallows. SLP will continue to follow pt to assess further improvement in swallow function and readiness for a repeat instrumental exam.    HPI HPI: Pt is an 78 y.o. male with past medical history significant for hypertension, GERD, prostate cancer and essential tremor who has developed diplopia and dysphagia over the past 6 weeks. He was recently discharged last week from [facility] where he was worked up for myasthenia gravis and started on prednisone and Mestinon pending results of ACTH receptor antibodies. Patient had been doing reasonably well until the morning of 03/06/18 when he noted that he suddenly developed a complete inability to swallow. Patient indicated in ED that he tolerated dinner last night as long as he ate slowly however this morning he was unable to drink even more than 2-3 sips of coffee. He was evaluated by evaluated by neurology in the ED and NG tube was recommended so he could take the Mestinon. However, NG tube placement was unsucessful with possible placement in his trachea. Patient had episode of significant desaturation to the mid 50s with coughing and oxygen requirement thereafter.      SLP Plan  Continue with current plan of care       Recommendations  Diet recommendations:  NPO Medication Administration: Via alternative means                Oral Care Recommendations: Oral care QID Follow up Recommendations: Outpatient SLP SLP Visit Diagnosis: Dysphagia, pharyngoesophageal phase (R13.14) Plan: Continue with current plan of care       Travis Morris I. Travis Morris, [place], [place] Office number [phone] Pager [place] 03/08/2018, 9:11 AM

## 2018-03-08 NOTE — Progress Notes (Signed)
NI f >40, VC 2.2.8L with great effort

## 2018-03-08 NOTE — Progress Notes (Signed)
Subjective: Feels like his speech is starting to improve  Exam: Vitals:   03/08/18 0806 03/08/18 1152  BP: (!) 146/64 (!) 155/67  Pulse: (!) 53 (!) 57  Resp: 20 15  Temp: 98.5 F (36.9 C) 97.9 F (36.6 C)  SpO2: 95% 93%   Gen: In bed, NAD Resp: non-labored breathing, no acute distress Abd: soft, nt  Neuro: MS: Awake, alert, interactive and appropriate CN: Extraocular movements appear intact, but he does have some diplopia. Motor: 5/5 bilaterally Sensory: Intact light touch  He still has some significant dysarthria.  Pertinent Labs: Creatinine 0.99  Impression: 78 year old male with myasthenic exacerbation presenting as dysphagia.  Given that he cannot take oral medications to treat his myasthenia, he has been admitted for IVIG and speech therapy.  Recommendations: 1) continue IVIG, today will be day 3. 2) continue Mestinon 60 every 8 3) continue prednisone 20 mg for today, likely will increase to 60 mg tomorrow if he displays significant improvement 4) continue speech therapy  Roland Rack, MD [facility] [phone]  If 7pm- 7am, please page neurology on call as listed in [place].

## 2018-03-08 NOTE — Progress Notes (Signed)
RT NOTE:  NIF -38 VC 2.0L Great effort

## 2018-03-09 ENCOUNTER — Ambulatory Visit: Payer: Medicare Other

## 2018-03-09 ENCOUNTER — Ambulatory Visit: Payer: Medicare Other | Admitting: Neurology

## 2018-03-09 LAB — GLUCOSE, CAPILLARY
GLUCOSE-CAPILLARY: 125 mg/dL — AB (ref 70–99)
Glucose-Capillary: 117 mg/dL — ABNORMAL HIGH (ref 70–99)
Glucose-Capillary: 119 mg/dL — ABNORMAL HIGH (ref 70–99)
Glucose-Capillary: 127 mg/dL — ABNORMAL HIGH (ref 70–99)
Glucose-Capillary: 138 mg/dL — ABNORMAL HIGH (ref 70–99)
Glucose-Capillary: 153 mg/dL — ABNORMAL HIGH (ref 70–99)
Glucose-Capillary: 154 mg/dL — ABNORMAL HIGH (ref 70–99)

## 2018-03-09 MED ORDER — PREDNISONE 20 MG PO TABS
60.0000 mg | ORAL_TABLET | Freq: Every day | ORAL | Status: DC
  Administered 2018-03-10 – 2018-03-13 (×4): 60 mg via ORAL
  Filled 2018-03-09 (×4): qty 3

## 2018-03-09 MED ORDER — SODIUM CHLORIDE 0.9 % IV BOLUS
500.0000 mL | Freq: Once | INTRAVENOUS | Status: AC
  Administered 2018-03-09: 500 mL via INTRAVENOUS

## 2018-03-09 MED ORDER — PYRIDOSTIGMINE BROMIDE 60 MG/5ML PO SOLN
30.0000 mg | Freq: Three times a day (TID) | ORAL | Status: DC
  Administered 2018-03-09 – 2018-03-13 (×11): 30 mg via ORAL
  Filled 2018-03-09 (×12): qty 2.5

## 2018-03-09 NOTE — Progress Notes (Signed)
PROGRESS NOTE    Travis Morris  OMA:[id number] DOB: [birth year] DOA: 03/06/2018 PCP: London Pepper, MD  Outpatient Specialists:   Brief Narrative: Travis Morris is an 78 year old male with past medical history significant for hypertension, GERD, prostate cancer and essential tremor who has developed diplopia and dysphagia over the past 6 weeks. He was recently discharged last week from [facility] where he was worked up for myasthenia gravis and started on prednisone and Mestinon pending results of ACTH receptor antibodies. The ACTH receptor antibodies have come back positive.  Patient had been doing reasonably well until the morning of admission when he noted that he suddenly developed a complete inability to swallow.  Patient also reported about 25 pound weight loss.  In the ED patient was evaluated by neurology who recommended placement of NG tube so he could take the Mestinon. Unfortunately NG tube placement was eventful with possible placement in his trachea. Patient had episode of significant desaturation to the mid 50s with coughing and oxygen requirement thereafter.  The plan is for the interventional radiology to place a cortrak tube (NG tube).  Neurology team is directing care.  Plan is for steroids, Mestinon as well as IVIG.  03/08/2018: Patient seen alongside patient's wife.  Patient reports some improvement in his speech.  Patient is still n.p.o.  P from neurology is highly appreciated.  03/09/2018: Dysarthria has improved significantly.  Patient is still n.p.o.  Discussed with neurology team, Dr. Kathrynn Speed, patient will be discharged when dysphagia resolved as per Dr. Leonel Ramsay.   Assessment & Plan:   Principal Problem:   Myasthenia gravis ([place]) Active Problems:   Sinus bradycardia   TIA (transient ischemic attack)   Gastro-esophageal reflux disease without esophagitis   Essential (primary) hypertension   Dysphagia  Myasthenia gravis:  Continue steroids, Mestinon  and IVIG. Supportive care. Rule out thymoma as planned by neurology team. Further management depend on hospital course. 03/08/2018: Patient seen alongside patient's wife.  Patient reports some improvement in his speech.  Patient is still n.p.o.  P from neurology is highly appreciated. 03/09/2018: Dysarthria has improved significantly.  Patient is still n.p.o.  Discussed with neurology team, Dr. Kathrynn Speed, patient will be discharged when dysphagia resolved as per Dr. Leonel Ramsay.  DYSPHAGIA: Likely related to myasthenia gravis NG tube placed. Dietitian input is appreciated.  Hypokalemia: Replete and continue to monitor.  Renal panel in am  HYPERTENSION: Continue home medications of Hyzaar and Toprol-XL  HISTORY OF TIA: Continue aspirin  DVT prophylaxis: Subcu Lovenox Code Status: Full code Family Communication:  Disposition Plan: Will depend on hospital course   Consultants:   Neurology  Procedures:   None  Antimicrobials:   None   Subjective: Dysarthria has improved significantly. Dysphagia persists  Objective: Vitals:   03/09/18 0337 03/09/18 0339 03/09/18 0800 03/09/18 1136  BP: (!) 184/70 (!) 184/70 (!) 189/76 (!) 170/75  Pulse: 62 62 63 62  Resp: 19 19 18 20   Temp: (!) 97.5 F (36.4 C) (!) 97.5 F (36.4 C)  98.2 F (36.8 C)  TempSrc: Oral Oral  Oral  SpO2: 91% 93% 96% 92%  Weight:   86 kg   Height:   5\' 8"  (1.727 m)     Intake/Output Summary (Last 24 hours) at 03/09/2018 1353 Last data filed at 03/09/2018 0800 Gross per 24 hour  Intake 1640.55 ml  Output 1225 ml  Net 415.55 ml   Filed Weights   03/08/18 0200 03/09/18 0800  Weight: 86 kg 86 kg  Examination:  General exam: Appears calm and comfortable.  Respiratory system: Clear to auscultation.  Cardiovascular system: S1 & S2 heard. No pedal edema. Gastrointestinal system: Abdomen is nondistended, soft and nontender. No organomegaly or masses felt. Normal bowel sounds  heard. Central nervous system: Alert and oriented.  Full range of movement.  Dysarthria noted.  Dysphagia reported.  Patient moves all limbs. Extremities: No leg edema.    Data Reviewed: I have personally reviewed following labs and imaging studies  CBC: Recent Labs  Lab 03/06/18 1009 03/06/18 1707 03/07/18 0402 03/08/18 0607  WBC 11.7* 10.8* 8.7 7.1  NEUTROABS 7.7  --   --  4.7  HGB 16.3 14.9 13.6 14.0  HCT 49.3 44.5 40.7 40.5  MCV 95.5 93.9 94.7 93.5  PLT 245 217 178 564   Basic Metabolic Panel: Recent Labs  Lab 03/06/18 1009 03/06/18 1707 03/07/18 0402 03/08/18 0607  NA 141  --  138 138  K 3.9  --  3.3* 3.6  CL 103  --  104 106  CO2 26  --  26 23  GLUCOSE 90  --  89 110*  BUN 23  --  22 20  CREATININE 1.12 1.13 0.91 0.99  CALCIUM 9.5  --  8.6* 8.5*  MG  --   --   --  2.1  PHOS  --   --   --  3.9   GFR: Estimated Creatinine Clearance: 64.5 mL/min (by C-G formula based on SCr of 0.99 mg/dL). Liver Function Tests: Recent Labs  Lab 03/06/18 1009 03/08/18 0607  AST 27  --   ALT 27  --   ALKPHOS 65  --   BILITOT 1.3*  --   PROT 7.0  --   ALBUMIN 4.3 3.0*   No results for input(s): LIPASE, AMYLASE in the last 168 hours. No results for input(s): AMMONIA in the last 168 hours. Coagulation Profile: Recent Labs  Lab 03/06/18 1009  INR 1.02   Cardiac Enzymes: No results for input(s): CKTOTAL, CKMB, CKMBINDEX, TROPONINI in the last 168 hours. BNP (last 3 results) No results for input(s): PROBNP in the last 8760 hours. HbA1C: No results for input(s): HGBA1C in the last 72 hours. CBG: Recent Labs  Lab 03/08/18 2017 03/08/18 2353 03/09/18 0338 03/09/18 0742 03/09/18 1141  GLUCAP 153* 134* 125* 119* 154*   Lipid Profile: No results for input(s): CHOL, HDL, LDLCALC, TRIG, CHOLHDL, LDLDIRECT in the last 72 hours. Thyroid Function Tests: No results for input(s): TSH, T4TOTAL, FREET4, T3FREE, THYROIDAB in the last 72 hours. Anemia Panel: No results for  input(s): VITAMINB12, FOLATE, FERRITIN, TIBC, IRON, RETICCTPCT in the last 72 hours. Urine analysis:    Component Value Date/Time   COLORURINE YELLOW 02/28/2018 [place] 02/28/2018 0318   LABSPEC 1.024 02/28/2018 0318   PHURINE 5.0 02/28/2018 0318   GLUCOSEU NEGATIVE 02/28/2018 0318   HGBUR NEGATIVE 02/28/2018 0318   BILIRUBINUR NEGATIVE 02/28/2018 0318   KETONESUR NEGATIVE 02/28/2018 0318   PROTEINUR NEGATIVE 02/28/2018 0318   UROBILINOGEN 0.2 08/23/2014 2359   NITRITE NEGATIVE 02/28/2018 0318   LEUKOCYTESUR NEGATIVE 02/28/2018 0318   Sepsis Labs: @LABRCNTIP (procalcitonin:4,lacticidven:4)  ) Recent Results (from the past 240 hour(s))  MRSA PCR Screening     Status: None   Collection Time: 02/28/18  8:53 PM  Result Value Ref Range Status   MRSA by PCR NEGATIVE NEGATIVE Final    Comment:        The GeneXpert MRSA Assay (FDA approved for NASAL specimens only),  is one component of a comprehensive MRSA colonization surveillance program. It is not intended to diagnose MRSA infection nor to guide or monitor treatment for MRSA infections. Performed at [facility], [address], [place], [place] [address]          Radiology Studies: No results found.      Scheduled Meds: . aspirin  81 mg Per NG tube Daily  . atorvastatin  20 mg Per NG tube Daily  . enoxaparin (LOVENOX) injection  40 mg Subcutaneous Q24H  . feeding supplement (PRO-STAT SUGAR FREE 64)  30 mL Per Tube Daily  . hydrochlorothiazide  25 mg Oral Daily  . Immune Globulin 10%  400 mg/kg Intravenous Q24 Hr x 5  . losartan  100 mg Oral Daily  . metoprolol succinate  50 mg Oral Daily  . oxybutynin  5 mg Per Tube TID  . pantoprazole  40 mg Oral Daily  . [START ON 03/10/2018] predniSONE  60 mg Oral Q breakfast  . pyridostigmine  60 mg Oral Q8H  . sodium chloride flush  3 mL Intravenous Once   Continuous Infusions: . feeding supplement (OSMOLITE 1.5 CAL) 1,000 mL  (03/08/18 1735)     LOS: 3 days    Time spent: 25 Minutes.   Dana Allan, MD  [facility] Pager #: [phone] 7PM-7AM contact night coverage as above

## 2018-03-09 NOTE — Progress Notes (Addendum)
Subjective: Patient awake, alert, NAD, tearful, cortrak in place. No family at bedside. RN at bedside. Patient states he has been having a reaction to mestinon. He will become dizzy and feel weak all over. O/n neurologist was notified.   Exam: Vitals:   03/09/18 0339 03/09/18 0800  BP: (!) 184/70 (!) 189/26  Pulse: 62 63  Resp: 19 18  Temp: (!) 97.5 F (36.4 C)   SpO2: 93% 96%   Gen: In bed, NAD Resp: non-labored breathing, no acute distress Skin: W/D/I  Neurological Examination Mental Status: Alert, oriented, thought content appropriate.  Speech fluent without evidence of aphasia.  Able to follow commands without difficulty. Cranial Nerves: II:  Visual fields grossly normal,  III,IV, VI: ptosis not present, extra-ocular motions intact bilaterally,slighty disconjugate gaze,  Diplopia noted when both eyes are open, pupils equal, round, reactive to light and accommodation V,VII: smile symmetric, facial light touch sensation normal bilaterally VIII: hearing normal bilaterally IX,X: uvula rises symmetrically XI: bilateral shoulder shrug XII: midline tongue extension Motor: Right : Upper extremity   5/5  Left:     Upper extremity   5/5  Lower extremity   5/5   Lower extremity   5/5 Tone and bulk:normal tone throughout; no atrophy noted Sensory:  light touch intact throughout, bilaterally Plantars: Right: downgoing   Left: downgoing Cerebellar: normal finger-to-nose,  and normal heel-to-shin test Gait: deferred   Pertinent Labs: 03/09/2018: NIF: -38 VC: 2.0L Great effort  Impression: 78 year old male with myasthenic exacerbation presenting as dysphagia.  Given that he cannot take oral medications to treat his myasthenia, he has been admitted for IVIG and speech therapy.  Recommendations: 1) continue IVIG, today will be day 4. 2) continue Mestinon 60 every 8 3)  increase prednisone to 60 mg  4) continue speech therapy   Laurey Morale, MSN, NP-C [facility] [phone]  Attending Neurologist note to follow:  I have seen the patient and reviewed the above note.  He continues to have improvement in the vocal quality.  He is continue to work with speech therapy.  Given that he is having improvement, I would favor increasing his prednisone at this time to 60 mg daily.  He also is having some side effects from the Mestinon and therefore I will decrease the dose to 30 mg to see if he tolerates this better.  Roland Rack, MD [facility] [phone]  If 7pm- 7am, please page neurology on call as listed in [place].

## 2018-03-09 NOTE — Progress Notes (Signed)
  Speech Language Pathology Treatment:    Patient Details Name: Travis Morris MRN: [id number] DOB: [birth year] Today's Date: 03/09/2018 Time: 1343-1400 SLP Time Calculation (min) (ACUTE ONLY): 17 min  Assessment / Plan / Recommendation Clinical Impression  Pt independently completed oral care and reported that he believes his swallow mechanism is stronger. He further stated that his speech is at or close to baseline. He tolerated ice chips, thin liquids via tsp, and 1/4 tsp boluses of puree without overt s/sx of aspiration. However, a wet vocal quality was inconsistently observed with thin liquids via cup and straw. Multiple swallows continue to be demonstrated with all boluses but to a greater extent with puree, suggesting pharyngeal residue. Considering the pt's wet vocal quality following secondary swallows, aspiration of pharyngeal residue is questioned. Pt's swallow function appears to be improvment but with his continued signs of aspiration, it is recommended that the repeat modified barium swallow be deferred until next week to facilitate further improvement in swallow function and increase the likelihood of him being able to safely tolerate a p.o. diet. Pt and his wife were educated regarding these recommendations and both parties verbalized understanding as well as agreement with plan of care.    HPI HPI: Pt is an 78 y.o. male with past medical history significant for hypertension, GERD, prostate cancer and essential tremor who has developed diplopia and dysphagia over the past 6 weeks. He was recently discharged last week from [facility] where he was worked up for myasthenia gravis and started on prednisone and Mestinon pending results of ACTH receptor antibodies. Patient had been doing reasonably well until the morning of 03/06/18 when he noted that he suddenly developed a complete inability to swallow. Patient indicated in ED that he tolerated dinner last night as long as he ate slowly  however this morning he was unable to drink even more than 2-3 sips of coffee. He was evaluated by evaluated by neurology in the ED and NG tube was recommended so he could take the Mestinon. However, NG tube placement was unsucessful with possible placement in his trachea. Patient had episode of significant desaturation to the mid 50s with coughing and oxygen requirement thereafter.      SLP Plan  Continue with current plan of care       Recommendations  Diet recommendations: NPO Medication Administration: Via alternative means                Oral Care Recommendations: Oral care QID Follow up Recommendations: Outpatient SLP SLP Visit Diagnosis: Dysphagia, pharyngoesophageal phase (R13.14) Plan: Continue with current plan of care       Shalane Florendo I. Hardin Negus, [place], [place] Office number [phone] Pager [place] 03/09/2018, 3:25 PM

## 2018-03-09 NOTE — Progress Notes (Signed)
NIF -28 CMH2O. VC 1.4 L

## 2018-03-10 LAB — GLUCOSE, CAPILLARY
GLUCOSE-CAPILLARY: 103 mg/dL — AB (ref 70–99)
Glucose-Capillary: 122 mg/dL — ABNORMAL HIGH (ref 70–99)
Glucose-Capillary: 135 mg/dL — ABNORMAL HIGH (ref 70–99)
Glucose-Capillary: 146 mg/dL — ABNORMAL HIGH (ref 70–99)
Glucose-Capillary: 158 mg/dL — ABNORMAL HIGH (ref 70–99)
Glucose-Capillary: 202 mg/dL — ABNORMAL HIGH (ref 70–99)

## 2018-03-10 MED ORDER — CARVEDILOL 12.5 MG PO TABS
12.5000 mg | ORAL_TABLET | Freq: Two times a day (BID) | ORAL | Status: DC
  Administered 2018-03-10 – 2018-03-13 (×7): 12.5 mg via ORAL
  Filled 2018-03-10 (×7): qty 1

## 2018-03-10 MED ORDER — ACETAMINOPHEN 325 MG PO TABS
650.0000 mg | ORAL_TABLET | Freq: Four times a day (QID) | ORAL | Status: DC | PRN
  Administered 2018-03-10: 650 mg via ORAL
  Filled 2018-03-10: qty 2

## 2018-03-10 MED ORDER — IMMUNE GLOBULIN (HUMAN) 5 GM/50ML IV SOLN
400.0000 mg/kg | Freq: Once | INTRAVENOUS | Status: AC
  Administered 2018-03-10: 35 g via INTRAVENOUS
  Filled 2018-03-10: qty 50

## 2018-03-10 NOTE — Progress Notes (Addendum)
Subjective: Patient awake, alert, NAD. Sitting up in chair. cortrak in place. Receiving TF. Diplopia slightly improved but still present with both eye open. Barium swallow  scheduled for Monday.  Exam: Vitals:   03/10/18 0345 03/10/18 0734  BP: (!) 159/73 (!) 177/71  Pulse: (!) 59 63  Resp: 17 20  Temp:  98 F (36.7 C)  SpO2: 94% 91%   Gen: In bed, NAD Resp: non-labored breathing, no acute distress Skin: W/D/I  Neurological Examination Mental Status: Alert, oriented, thought content appropriate.  Speech fluent without evidence of aphasia.  Able to follow commands without difficulty. Cranial Nerves: II:  Visual fields grossly normal,  III,IV, VI: ptosis not present, extra-ocular motions intact bilaterally,slighty disconjugate gaze,  Diplopia noted when both eyes are open, pupils equal, round, reactive to light and accommodation V,VII: smile symmetric, facial light touch sensation normal bilaterally VIII: hearing normal bilaterally IX,X: uvula rises symmetrically XI: bilateral shoulder shrug XII: midline tongue extension Motor: Right : Upper extremity   5/5  Left:     Upper extremity   5/5  Lower extremity   5/5   Lower extremity   5/5 Tone and bulk:normal tone throughout; no atrophy noted Sensory:  light touch intact throughout, bilaterally Plantars: Right: downgoing   Left: downgoing Cerebellar: normal finger-to-nose,  and normal heel-to-shin test Gait: deferred   Pertinent Labs: 03/10/2018: NIF: -30 VC: 1.8 L   Impression: 78 year old male with myasthenic exacerbation presenting as dysphagia.  Given that he cannot take oral medications to treat his myasthenia, he has been admitted for IVIG and speech therapy.  Recommendations: 1) continue IVIG, today will be day 5 2) continue Mestinon 30 every 8 3)  continue prednisone  60 mg daily 4) continue speech therapy   Laurey Morale, MSN, NP-C [facility] [phone]  Attending Neurologist note to  follow:  I have seen the patient and reviewed the above note.  He has been tolerating the Mestinon 30 mg much better than the 60 mg.  I would favor continuing this.  IVIG is completing, but he would not have reached his maximal benefit from this.  Already his speech sounds much better, and I would favor continuing prednisone. Once he is able to take PO, he can continue to improve at home.   Roland Rack, MD [facility] [phone]  If 7pm- 7am, please page neurology on call as listed in [place].

## 2018-03-10 NOTE — Progress Notes (Signed)
NIF -30  VC 1.8L

## 2018-03-10 NOTE — Progress Notes (Signed)
NIF -40 VC 1.8L  Good Effort

## 2018-03-10 NOTE — Progress Notes (Signed)
PROGRESS NOTE    Travis Morris  WUJ:[id number] DOB: 03-07-1940 DOA: 03/06/2018 PCP: London Pepper, MD  Outpatient Specialists:   Brief Narrative: Travis Morris is an 78 year old male with past medical history significant for hypertension, GERD, prostate cancer and essential tremor who has developed diplopia and dysphagia over the past 6 weeks. He was recently discharged last week from [facility] where he was worked up for myasthenia gravis and started on prednisone and Mestinon pending results of ACTH receptor antibodies. The ACTH receptor antibodies have come back positive.  Patient had been doing reasonably well until the morning of admission when he noted that he suddenly developed a complete inability to swallow.  Patient also reported about 25 pound weight loss.  In the ED patient was evaluated by neurology who recommended placement of NG tube so he could take the Mestinon. Unfortunately NG tube placement was eventful with possible placement in his trachea. Patient had episode of significant desaturation to the mid 50s with coughing and oxygen requirement thereafter.  The plan is for the interventional radiology to place a cortrak tube (NG tube).  Neurology team is directing care.  Plan is for steroids, Mestinon as well as IVIG.  03/08/2018: Patient seen alongside patient's wife.  Patient reports some improvement in his speech.  Patient is still n.p.o.  P from neurology is highly appreciated.  03/09/2018: Dysarthria has improved significantly.  Patient is still n.p.o.  Discussed with neurology team, Dr. Kathrynn Speed, patient will be discharged when dysphagia resolved as per Dr. Leonel Ramsay.   03/10/2018: Patient seen.  Patient continues to improve.  Speech therapy indicated evaluation is highly appreciated.  For barium swallow next week.  Dysarthria has improved significantly.  Dysphagia is improving, but aspiration risk persists.  Patient is still to be kept n.p.o.  Assessment &  Plan:   Principal Problem:   Myasthenia gravis ([place]) Active Problems:   Sinus bradycardia   TIA (transient ischemic attack)   Gastro-esophageal reflux disease without esophagitis   Essential (primary) hypertension   Dysphagia  Myasthenia gravis:  Continue steroids, Mestinon and IVIG. Supportive care. Rule out thymoma as planned by neurology team. Further management depend on hospital course. 03/08/2018: Patient seen alongside patient's wife.  Patient reports some improvement in his speech.  Patient is still n.p.o.  P from neurology is highly appreciated. 03/09/2018: Dysarthria has improved significantly.  Patient is still n.p.o.  Discussed with neurology team, Dr. Kathrynn Speed, patient will be discharged when dysphagia resolved as per Dr. Leonel Ramsay. 03/10/2018: Patient seen.  Patient continues to improve.  Speech therapy indicated evaluation is highly appreciated.  For barium swallow next week.  Dysarthria has improved significantly.  Dysphagia is improving, but aspiration risk persists.  Patient is still to be kept n.p.o.  DYSPHAGIA: Likely related to myasthenia gravis NG tube placed. Dietitian input is appreciated. 03/10/2018: Dysphagia is improving, but patient remains high risk for aspiration.  For barium swallow next week.  Hypokalemia: Replete and continue to monitor.  Potassium was 3.6 on 03/08/2018.  HYPERTENSION: 03/10/2018: Continue Hyzaar.  DC metoprolol.  Start Coreg 12.5 mg via NG tube twice daily.  Continue to monitor closely.  BP control has not been optimized.   HISTORY OF TIA: Continue aspirin  DVT prophylaxis: Subcu Lovenox Code Status: Full code Family Communication:  Disposition Plan: Will depend on hospital course   Consultants:   Neurology  Procedures:   None  Antimicrobials:   None   Subjective: Dysarthria has improved significantly. Dysphagia is improving.  Objective: Vitals:   03/09/18 2345 03/10/18 0030 03/10/18 0345  03/10/18 0734  BP: (!) 165/72 (!) 156/75 (!) 159/73 (!) 177/71  Pulse: 62 (!) 58 (!) 59 63  Resp: 18 20 17 20   Temp: (!) 97.5 F (36.4 C) 98.3 F (36.8 C)  98 F (36.7 C)  TempSrc: Oral Oral  Oral  SpO2: 96% 94% 94% 91%  Weight:      Height:        Intake/Output Summary (Last 24 hours) at 03/10/2018 1694 Last data filed at 03/09/2018 2352 Gross per 24 hour  Intake 882.85 ml  Output 500 ml  Net 382.85 ml   Filed Weights   03/08/18 0200 03/09/18 0800  Weight: 86 kg 86 kg    Examination:  General exam: Appears calm and comfortable.  Respiratory system: Clear to auscultation.  Cardiovascular system: S1 & S2 heard. No pedal edema. Gastrointestinal system: Abdomen is nondistended, soft and nontender. No organomegaly or masses felt. Normal bowel sounds heard. Central nervous system: Alert and oriented.  Full range of movement.  Dysarthria noted.  Dysphagia reported.  Patient moves all limbs. Extremities: No leg edema.    Data Reviewed: I have personally reviewed following labs and imaging studies  CBC: Recent Labs  Lab 03/06/18 1009 03/06/18 1707 03/07/18 0402 03/08/18 0607  WBC 11.7* 10.8* 8.7 7.1  NEUTROABS 7.7  --   --  4.7  HGB 16.3 14.9 13.6 14.0  HCT 49.3 44.5 40.7 40.5  MCV 95.5 93.9 94.7 93.5  PLT 245 217 178 503   Basic Metabolic Panel: Recent Labs  Lab 03/06/18 1009 03/06/18 1707 03/07/18 0402 03/08/18 0607  NA 141  --  138 138  K 3.9  --  3.3* 3.6  CL 103  --  104 106  CO2 26  --  26 23  GLUCOSE 90  --  89 110*  BUN 23  --  22 20  CREATININE 1.12 1.13 0.91 0.99  CALCIUM 9.5  --  8.6* 8.5*  MG  --   --   --  2.1  PHOS  --   --   --  3.9   GFR: Estimated Creatinine Clearance: 64.5 mL/min (by C-G formula based on SCr of 0.99 mg/dL). Liver Function Tests: Recent Labs  Lab 03/06/18 1009 03/08/18 0607  AST 27  --   ALT 27  --   ALKPHOS 65  --   BILITOT 1.3*  --   PROT 7.0  --   ALBUMIN 4.3 3.0*   No results for input(s): LIPASE, AMYLASE  in the last 168 hours. No results for input(s): AMMONIA in the last 168 hours. Coagulation Profile: Recent Labs  Lab 03/06/18 1009  INR 1.02   Cardiac Enzymes: No results for input(s): CKTOTAL, CKMB, CKMBINDEX, TROPONINI in the last 168 hours. BNP (last 3 results) No results for input(s): PROBNP in the last 8760 hours. HbA1C: No results for input(s): HGBA1C in the last 72 hours. CBG: Recent Labs  Lab 03/09/18 1626 03/09/18 2018 03/09/18 2344 03/10/18 0344 03/10/18 0733  GLUCAP 127* 138* 117* 122* 103*   Lipid Profile: No results for input(s): CHOL, HDL, LDLCALC, TRIG, CHOLHDL, LDLDIRECT in the last 72 hours. Thyroid Function Tests: No results for input(s): TSH, T4TOTAL, FREET4, T3FREE, THYROIDAB in the last 72 hours. Anemia Panel: No results for input(s): VITAMINB12, FOLATE, FERRITIN, TIBC, IRON, RETICCTPCT in the last 72 hours. Urine analysis:    Component Value Date/Time   COLORURINE YELLOW 02/28/2018 [place] 02/28/2018  [place] 1.024 02/28/2018 0318   PHURINE 5.0 02/28/2018 0318   GLUCOSEU NEGATIVE 02/28/2018 0318   HGBUR NEGATIVE 02/28/2018 0318   BILIRUBINUR NEGATIVE 02/28/2018 0318   KETONESUR NEGATIVE 02/28/2018 0318   PROTEINUR NEGATIVE 02/28/2018 0318   UROBILINOGEN 0.2 08/23/2014 2359   NITRITE NEGATIVE 02/28/2018 0318   LEUKOCYTESUR NEGATIVE 02/28/2018 0318   Sepsis Labs: @LABRCNTIP (procalcitonin:4,lacticidven:4)  ) Recent Results (from the past 240 hour(s))  MRSA PCR Screening     Status: None   Collection Time: 02/28/18  8:53 PM  Result Value Ref Range Status   MRSA by PCR NEGATIVE NEGATIVE Final    Comment:        The GeneXpert MRSA Assay (FDA approved for NASAL specimens only), is one component of a comprehensive MRSA colonization surveillance program. It is not intended to diagnose MRSA infection nor to guide or monitor treatment for MRSA infections. Performed at [facility], [place] [address] [place], [place] [address]          Radiology Studies: No results found.      Scheduled Meds: . aspirin  81 mg Per NG tube Daily  . atorvastatin  20 mg Per NG tube Daily  . carvedilol  12.5 mg Oral BID WC  . enoxaparin (LOVENOX) injection  40 mg Subcutaneous Q24H  . feeding supplement (PRO-STAT SUGAR FREE 64)  30 mL Per Tube Daily  . hydrochlorothiazide  25 mg Oral Daily  . losartan  100 mg Oral Daily  . oxybutynin  5 mg Per Tube TID  . pantoprazole  40 mg Oral Daily  . predniSONE  60 mg Oral Q breakfast  . pyridostigmine  30 mg Oral Q8H  . sodium chloride flush  3 mL Intravenous Once   Continuous Infusions: . feeding supplement (OSMOLITE 1.5 CAL) 1,000 mL (03/09/18 1616)     LOS: 4 days    Time spent: 25 Minutes.   Dana Allan, MD  [facility] Pager #: [phone] 7PM-7AM contact night coverage as above

## 2018-03-10 NOTE — Progress Notes (Signed)
nif -28 vc 1.4

## 2018-03-11 LAB — GLUCOSE, CAPILLARY
GLUCOSE-CAPILLARY: 163 mg/dL — AB (ref 70–99)
GLUCOSE-CAPILLARY: 166 mg/dL — AB (ref 70–99)
Glucose-Capillary: 124 mg/dL — ABNORMAL HIGH (ref 70–99)
Glucose-Capillary: 135 mg/dL — ABNORMAL HIGH (ref 70–99)
Glucose-Capillary: 142 mg/dL — ABNORMAL HIGH (ref 70–99)
Glucose-Capillary: 148 mg/dL — ABNORMAL HIGH (ref 70–99)

## 2018-03-11 LAB — MUSK ANTIBODIES: MuSK Antibodies: <1 U/mL

## 2018-03-11 MED ORDER — DEXTROSE 5 % IV SOLN
INTRAVENOUS | Status: DC
  Administered 2018-03-12 (×2): via INTRAVENOUS

## 2018-03-11 NOTE — Progress Notes (Signed)
PROGRESS NOTE    Travis Morris  YFV:[id number] DOB: May 11, 1940 DOA: 03/06/2018 PCP: London Pepper, MD  Outpatient Specialists:   Brief Narrative: Travis Morris is an 78 year old male with past medical history significant for hypertension, GERD, prostate cancer and essential tremor who has developed diplopia and dysphagia over the past 6 weeks. He was recently discharged last week from [facility] where he was worked up for myasthenia gravis and started on prednisone and Mestinon pending results of ACTH receptor antibodies. The ACTH receptor antibodies have come back positive.  Patient had been doing reasonably well until the morning of admission when he noted that he suddenly developed a complete inability to swallow.  Patient also reported about 25 pound weight loss.  In the ED patient was evaluated by neurology who recommended placement of NG tube so he could take the Mestinon. Unfortunately NG tube placement was eventful with possible placement in his trachea. Patient had episode of significant desaturation to the mid 50s with coughing and oxygen requirement thereafter.  The plan is for the interventional radiology to place a cortrak tube (NG tube).  Neurology team is directing care.  Plan is for steroids, Mestinon as well as IVIG.  03/08/2018: Patient seen alongside patient's wife.  Patient reports some improvement in his speech.  Patient is still n.p.o.  P from neurology is highly appreciated.  03/09/2018: Dysarthria has improved significantly.  Patient is still n.p.o.  Discussed with neurology team, Dr. Kathrynn Speed, patient will be discharged when dysphagia resolved as per Dr. Leonel Ramsay.   03/10/2018: Patient seen.  Patient continues to improve.  Speech therapy indicated evaluation is highly appreciated.  For barium swallow next week.  Dysarthria has improved significantly.  Dysphagia is improving, but aspiration risk persists.  Patient is still to be kept n.p.o.  03/11/2018: Patient  continues to improve.  Dysphagia is improving.  Diplopia is improving, but persists.  For modified barium swallow in the morning.  Assessment & Plan:   Principal Problem:   Myasthenia gravis ([place]) Active Problems:   Sinus bradycardia   TIA (transient ischemic attack)   Gastro-esophageal reflux disease without esophagitis   Essential (primary) hypertension   Dysphagia  Myasthenia gravis:  Continue steroids, Mestinon and IVIG. Supportive care. Rule out thymoma as planned by neurology team. Further management depend on hospital course. 03/08/2018: Patient seen alongside patient's wife.  Patient reports some improvement in his speech.  Patient is still n.p.o.  P from neurology is highly appreciated. 03/09/2018: Dysarthria has improved significantly.  Patient is still n.p.o.  Discussed with neurology team, Dr. Kathrynn Speed, patient will be discharged when dysphagia resolved as per Dr. Leonel Ramsay. 03/10/2018: Patient seen.  Patient continues to improve.  Speech therapy indicated evaluation is highly appreciated.  For barium swallow next week.  Dysarthria has improved significantly.  Dysphagia is improving, but aspiration risk persists.  Patient is still to be kept n.p.o. 03/11/2018: Patient continues to improve.  Dysphagia is improving.  Diplopia is improving, but persists.  For modified barium swallow in the morning.  DYSPHAGIA: Likely related to myasthenia gravis NG tube placed. Dietitian input is appreciated. 03/10/2018: Dysphagia is improving, but patient remains high risk for aspiration.  For barium swallow next week. 03/11/2018: Improving.  For modified barium swallow in the morning.  Hypokalemia: Replete and continue to monitor.  Potassium was 3.6 on 03/08/2018.  HYPERTENSION: 03/10/2018: Continue Hyzaar.  DC metoprolol.  Start Coreg 12.5 mg via NG tube twice daily.  Continue to monitor closely.  BP control  has not been optimized.   HISTORY OF TIA: Continue aspirin  DVT  prophylaxis: Subcu Lovenox Code Status: Full code Family Communication:  Disposition Plan: Will depend on hospital course   Consultants:   Neurology  Procedures:   None  Antimicrobials:   None   Subjective: Dysarthria has improved significantly. Dysphagia is improving. Diplopia is improving, but persists.  Objective: Vitals:   03/10/18 2332 03/11/18 0405 03/11/18 0500 03/11/18 0802  BP: 115/60 134/69  (!) 150/63  Pulse: (!) 50 (!) 56  (!) 54  Resp:  16  18  Temp:  (!) 97.5 F (36.4 C)  97.8 F (36.6 C)  TempSrc:  Oral  Oral  SpO2: 90% 91%  94%  Weight:   85.5 kg   Height:        Intake/Output Summary (Last 24 hours) at 03/11/2018 0957 Last data filed at 03/11/2018 0430 Gross per 24 hour  Intake -  Output 500 ml  Net -500 ml   Filed Weights   03/08/18 0200 03/09/18 0800 03/11/18 0500  Weight: 86 kg 86 kg 85.5 kg    Examination:  General exam: Appears calm and comfortable.  Respiratory system: Clear to auscultation.  Cardiovascular system: S1 & S2 heard. No pedal edema. Gastrointestinal system: Abdomen is nondistended, soft and nontender. No organomegaly or masses felt. Normal bowel sounds heard. Central nervous system: Alert and oriented.  Full range of movement.  Dysarthria noted.  Dysphagia reported.  Patient moves all limbs. Extremities: No leg edema.    Data Reviewed: I have personally reviewed following labs and imaging studies  CBC: Recent Labs  Lab 03/06/18 1009 03/06/18 1707 03/07/18 0402 03/08/18 0607  WBC 11.7* 10.8* 8.7 7.1  NEUTROABS 7.7  --   --  4.7  HGB 16.3 14.9 13.6 14.0  HCT 49.3 44.5 40.7 40.5  MCV 95.5 93.9 94.7 93.5  PLT 245 217 178 161   Basic Metabolic Panel: Recent Labs  Lab 03/06/18 1009 03/06/18 1707 03/07/18 0402 03/08/18 0607  NA 141  --  138 138  K 3.9  --  3.3* 3.6  CL 103  --  104 106  CO2 26  --  26 23  GLUCOSE 90  --  89 110*  BUN 23  --  22 20  CREATININE 1.12 1.13 0.91 0.99  CALCIUM 9.5  --   8.6* 8.5*  MG  --   --   --  2.1  PHOS  --   --   --  3.9   GFR: Estimated Creatinine Clearance: 64.4 mL/min (by C-G formula based on SCr of 0.99 mg/dL). Liver Function Tests: Recent Labs  Lab 03/06/18 1009 03/08/18 0607  AST 27  --   ALT 27  --   ALKPHOS 65  --   BILITOT 1.3*  --   PROT 7.0  --   ALBUMIN 4.3 3.0*   No results for input(s): LIPASE, AMYLASE in the last 168 hours. No results for input(s): AMMONIA in the last 168 hours. Coagulation Profile: Recent Labs  Lab 03/06/18 1009  INR 1.02   Cardiac Enzymes: No results for input(s): CKTOTAL, CKMB, CKMBINDEX, TROPONINI in the last 168 hours. BNP (last 3 results) No results for input(s): PROBNP in the last 8760 hours. HbA1C: No results for input(s): HGBA1C in the last 72 hours. CBG: Recent Labs  Lab 03/10/18 1552 03/10/18 1958 03/10/18 2344 03/11/18 0323 03/11/18 0759  GLUCAP 202* 158* 135* 135* 124*   Lipid Profile: No results for input(s): CHOL, HDL, LDLCALC,  TRIG, CHOLHDL, LDLDIRECT in the last 72 hours. Thyroid Function Tests: No results for input(s): TSH, T4TOTAL, FREET4, T3FREE, THYROIDAB in the last 72 hours. Anemia Panel: No results for input(s): VITAMINB12, FOLATE, FERRITIN, TIBC, IRON, RETICCTPCT in the last 72 hours. Urine analysis:    Component Value Date/Time   COLORURINE YELLOW 02/28/2018 0318   APPEARANCEUR CLEAR 02/28/2018 0318   LABSPEC 1.024 02/28/2018 0318   PHURINE 5.0 02/28/2018 0318   GLUCOSEU NEGATIVE 02/28/2018 0318   HGBUR NEGATIVE 02/28/2018 0318   BILIRUBINUR NEGATIVE 02/28/2018 0318   KETONESUR NEGATIVE 02/28/2018 0318   PROTEINUR NEGATIVE 02/28/2018 0318   UROBILINOGEN 0.2 08/23/2014 2359   NITRITE NEGATIVE 02/28/2018 0318   LEUKOCYTESUR NEGATIVE 02/28/2018 0318   Sepsis Labs: @LABRCNTIP (procalcitonin:4,lacticidven:4)  ) No results found for this or any previous visit (from the past 240 hour(s)).       Radiology Studies: No results found.      Scheduled  Meds: . aspirin  81 mg Per NG tube Daily  . atorvastatin  20 mg Per NG tube Daily  . carvedilol  12.5 mg Oral BID WC  . enoxaparin (LOVENOX) injection  40 mg Subcutaneous Q24H  . feeding supplement (PRO-STAT SUGAR FREE 64)  30 mL Per Tube Daily  . hydrochlorothiazide  25 mg Oral Daily  . losartan  100 mg Oral Daily  . oxybutynin  5 mg Per Tube TID  . pantoprazole  40 mg Oral Daily  . predniSONE  60 mg Oral Q breakfast  . pyridostigmine  30 mg Oral Q8H  . sodium chloride flush  3 mL Intravenous Once   Continuous Infusions: . feeding supplement (OSMOLITE 1.5 CAL) 1,000 mL (03/10/18 1558)     LOS: 5 days    Time spent: 25 Minutes.   Dana Allan, MD  [facility] Pager #: [phone] 7PM-7AM contact night coverage as above

## 2018-03-11 NOTE — Progress Notes (Signed)
Subjective: He feels like he is improving  Exam: Vitals:   03/11/18 1125 03/11/18 1511  BP: (!) 143/63 (!) 145/90  Pulse: (!) 56 67  Resp: 18 18  Temp: (!) 97.5 F (36.4 C) (!) 97.4 F (36.3 C)  SpO2: 92% 92%   Gen: In bed, NAD Resp: non-labored breathing, no acute distress Abd: soft, nt  Neuro: MS: Awake, alert, interactive and appropriate CN: Pupils equal round and reactive to light, eyes are very mildly disconjugate, palate elevates well, smile symmetric and not really transverse Motor: 5/5 throughout Sensory: Intact light touch  Pertinent Labs: Acetylcholine receptor antibodies positive for modulating and blocking CT chest-negative for thymoma  Impression: 78 year old male with newly diagnosed myasthenia gravis with exacerbation resulting in dysphagia.  He has had good response to IVIG, and I suspect that he will be able to swallow much better now.  I have increased his prednisone to 60 mg a day and I would favor continuing this for the time being, he was unable to tolerate 60 mg doses of Mestinon, but seems to be tolerating 30 mg doses better.  Recommendations: 1) continue prednisone 60 mg/day 2) continue Mestinon 30 mg every 6 hours while awake 3) he will need outpatient neurology follow-up, he has seen Dr. Carles Collet in the past. 4) swallow study tomorrow 5) neurology will continue to follow  Roland Rack, MD [facility] [phone]  If 7pm- 7am, please page neurology on call as listed in [place].

## 2018-03-11 NOTE — Progress Notes (Signed)
nif -40 VC 1.6

## 2018-03-11 NOTE — Progress Notes (Signed)
NIF -38 VC 2.4L  Patient has good effort.

## 2018-03-12 ENCOUNTER — Inpatient Hospital Stay (HOSPITAL_COMMUNITY): Payer: Medicare Other

## 2018-03-12 DIAGNOSIS — G7000 Myasthenia gravis without (acute) exacerbation: Secondary | ICD-10-CM

## 2018-03-12 LAB — GLUCOSE, CAPILLARY
GLUCOSE-CAPILLARY: 233 mg/dL — AB (ref 70–99)
Glucose-Capillary: 103 mg/dL — ABNORMAL HIGH (ref 70–99)
Glucose-Capillary: 101 mg/dL — ABNORMAL HIGH (ref 70–99)
Glucose-Capillary: 164 mg/dL — ABNORMAL HIGH (ref 70–99)
Glucose-Capillary: 165 mg/dL — ABNORMAL HIGH (ref 70–99)

## 2018-03-12 NOTE — Progress Notes (Signed)
  Speech Language Pathology Treatment:    Patient Details Name: Travis Morris MRN: [id number] DOB: Mar 29, 1940 Today's Date: 03/12/2018 Time: 4888-9169 SLP Time Calculation (min) (ACUTE ONLY): 44 min  Assessment / Plan / Recommendation Clinical Impression  Pt was seen for dysphagia treatment with his wife present. Pt and his wife were educated extensively regarding the results of the modified barium swallow study, diet recommendations, swallowing precautions, and the rationale for swallowing precautions. Video recording of the study was used to facilitate educated and both parties verbalized understanding as well as agreement. He tolerated solids and liquids without overt s/sx of aspiration when swallowing precautions were consistently observed. However, a mildly wet voal quality was noted when pt did not utilize a the chin tuck posture with three boluses. Pt's vocal quality was improved with prompted coughing. Pt independently reduced bolus sizes but required minimal-moderate cues to use a liquid wash and moderate cues for consistent use of the chin tuck posture. SLP will continue to follow pt to facilitate his consistent observance of swallowing precautions. Dr. Marthenia Rolling has been advised of the pt's need for outpatient SLP services upon discharge for dysphagia and has indicated that it will be included in the discharge orders.    HPI HPI: Pt is an 78 y.o. male with past medical history significant for hypertension, GERD, prostate cancer and essential tremor who has developed diplopia and dysphagia over the past 6 weeks. He was recently discharged last week from [facility] where he was worked up for myasthenia gravis and started on prednisone and Mestinon pending results of ACTH receptor antibodies. Patient had been doing reasonably well until the morning of 03/06/18 when he noted that he suddenly developed a complete inability to swallow. Patient indicated in ED that he tolerated dinner last night as  long as he ate slowly however this morning he was unable to drink even more than 2-3 sips of coffee. He was evaluated by evaluated by neurology in the ED and NG tube was recommended so he could take the Mestinon. However, NG tube placement was unsucessful with possible placement in his trachea. Patient had episode of significant desaturation to the mid 50s with coughing and oxygen requirement thereafter.      SLP Plan  Continue with current plan of care       Recommendations  Diet recommendations: Dysphagia 3 (mechanical soft);Thin liquid Liquids provided via: Cup;Straw Medication Administration: Crushed with puree Supervision: Patient able to self feed;Intermittent supervision to cue for compensatory strategies Compensations: Slow rate;Small sips/bites;Chin tuck;Follow solids with liquid Postural Changes and/or Swallow Maneuvers: Seated upright 90 degrees;Upright 30-60 min after meal;Chin tuck                Oral Care Recommendations: Oral care BID Follow up Recommendations: Outpatient SLP SLP Visit Diagnosis: Dysphagia, pharyngeal phase (R13.13) Plan: Continue with current plan of care       Travis Morris, [place], [place] Office number [phone] Pager [place] 03/12/2018, 3:23 PM

## 2018-03-12 NOTE — Progress Notes (Signed)
After three attempts for NIF and VC results are as follow;   NIF: > -40   VC: 2.7 L

## 2018-03-12 NOTE — Progress Notes (Signed)
Modified Barium Swallow Progress Note  Patient Details  Name: Travis Morris MRN: [id number] Date of Birth: February 26, 1940  Today's Date: 03/12/2018  Modified Barium Swallow completed.  Full report located under Chart Review in the Imaging Section.  Brief recommendations include the following:  Clinical Impression  Pt's swallow function has improved compared to that which was noted on 03/07/18. He now presents with mild-moderate pharyngeal dysphagia characterized by reduced hyolaryngeal elevation and excursion which resulted in reduced epiglottic retroversion, transient penetration (PAS 2) with thin liquids, and pyriform sinus residue. Mild-moderate vallecular residue was also noted secondary to reduced lingual retraction. A chin tuck posture was effective in facilitating epiglottic retroversion, eliminating penetration, and reducing pharyngeal residue. A liquid wash further reduced pharyngeal residue to a more functional level. It is recommended that a dysphagia 3 diet with thin liquids be initiated at this time with strict observance of swallowing precautions. SLP will follow pt to ensure tolerance of the recommended diet and it is recommended that outpatient SLP services for dysphagia be continued following discharge.    Swallow Evaluation Recommendations       SLP Diet Recommendations: Dysphagia 3 (Mech soft) solids;Thin liquid   Liquid Administration via: Cup;Straw   Medication Administration: Crushed with puree   Supervision: Intermittent supervision to cue for compensatory strategies;Patient able to self feed   Compensations: Slow rate;Small sips/bites;Chin tuck;Follow solids with liquid   Postural Changes: Seated upright at 90 degrees;Remain semi-upright after after feeds/meals (Comment)   Oral Care Recommendations: Oral care BID;Patient independent with oral care      Cleston Lautner I. Hardin Negus, [place], [place] Office number [phone] Pager  [phone]   Horton Marshall 03/12/2018,9:25 AM

## 2018-03-12 NOTE — Progress Notes (Addendum)
NEUROLOGY PROGRESS NOTE  Subjective: Patient is feeling much better today.  He has passed a swallow screen which he is very happy about.  He does have a follow-up appointment with Dr. Posey Pronto and knows the date and time that he needs to be there.  Mestinon had to be decreased secondary to patient having time locked symptoms of dizziness and numbness etc. But at this point having no difficulties per patient.  Exam: Vitals:   03/12/18 0337 03/12/18 0921  BP: (!) 145/78 (!) 153/70  Pulse: (!) 51 (!) 56  Resp: 16 20  Temp: 97.8 F (36.6 C) 97.7 F (36.5 C)  SpO2: 93% 91%    Physical Exam   HEENT-  Normocephalic, no lesions, without obvious abnormality.  Normal external eye and conjunctiva.  Extremities- Warm, dry and intact Musculoskeletal-no joint tenderness, deformity or swelling Skin-warm and dry, no hyperpigmentation, vitiligo, or suspicious lesions  Neuro:  Mental Status: Alert, oriented, thought content appropriate.  Speech fluent without evidence of aphasia.  Able to follow 3 step commands without difficulty. His speech is not dysarthric but remains guttural. Cranial Nerves: II:  Visual fields grossly normal, visual acuity within normal limits III,IV, VI: ptosis not present, extra-ocular motions intact bilaterally pupils equal, round, reactive to light and accommodation.  Reports vertical diplopia on neutral gaze gazing forward.  No diplopia on gaze on either ends. V,VII: smile symmetric, facial light touch sensation normal bilaterally VIII: hearing normal bilaterally  Motor: Right : Upper extremity   5/5    Left:     Upper extremity   5/5  Lower extremity   5/5     Lower extremity   5/5 Tone and bulk:normal tone throughout; no atrophy noted No fatigability on exam Sensory: Pinprick and light touch intact throughout, bilaterally Cerebellar: normal finger-to-nose   Medications:  Scheduled: . aspirin  81 mg Per NG tube Daily  . atorvastatin  20 mg Per NG tube Daily  .  carvedilol  12.5 mg Oral BID WC  . enoxaparin (LOVENOX) injection  40 mg Subcutaneous Q24H  . feeding supplement (PRO-STAT SUGAR FREE 64)  30 mL Per Tube Daily  . hydrochlorothiazide  25 mg Oral Daily  . losartan  100 mg Oral Daily  . oxybutynin  5 mg Per Tube TID  . pantoprazole  40 mg Oral Daily  . predniSONE  60 mg Oral Q breakfast  . pyridostigmine  30 mg Oral Q8H  . sodium chloride flush  3 mL Intravenous Once   Continuous: . dextrose 50 mL/hr at 03/12/18 0000  . feeding supplement (OSMOLITE 1.5 CAL) 1,000 mL (03/11/18 1620)    Pertinent Labs/Diagnostics: Swallow screen: At this point he is to have dysphasia 3/soft solids/thin liquid and drink with a straw. Acetylcholine receptor antibodies positive for modulating and blocking antibodies CT chest-negative for thymoma  Etta Quill PA-C [facility] [phone]  Assessment: 78 year old male with newly diagnosed myasthenia gravis with exacerbation resulting in dysphagia and bulbar symptoms.  Patient did have a good response to IVIG and now is passed his swallow screen.   As noted Mestinon needed to be decreased to secondary to side effects, those were time locked with the dosage administration with a higher dose.  Impression Seropositive myasthenia gravis with predominant bulbar features.   Recommendations: -Completed IVIG 5 days.  No further need for IVIG. -Continue Mestinon 30 3 times daily -Continue steroids-prednisone 60 mg daily. -Follow-up with Dr. Posey Pronto on scheduled date March 21, 2018.  Consider steroid sparing agents per outpatient neurology. -  Follow speech/swallow recommendations -If any significant changes with swallowing please call 911 and come back to the hospital. Inpatient neurology service will be available as needed.  Please call us with questions.  03/12/2018, 9:32 AM  Attending Neurohospitalist Addendum Patient seen and examined with APP/Resident. Agree with the history and physical as  documented above. Agree with the plan as documented, which I helped formulate. I have independently reviewed the chart, obtained history, review of systems and examined the patient.I have personally reviewed pertinent head/neck/spine imaging (CT/MRI). Please feel free to call with any questions. --- Amie Portland, MD [facility] Pager: [phone]  If 7pm to 7am, please call on call as listed on AMION.

## 2018-03-12 NOTE — Progress Notes (Signed)
PROGRESS NOTE    Travis Morris  HWE:[id number] DOB: 03-17-1940 DOA: 03/06/2018 PCP: London Pepper, MD  Outpatient Specialists:   Brief Narrative: Travis Morris is an 78 year old male with past medical history significant for hypertension, GERD, prostate cancer and essential tremor who has developed diplopia and dysphagia over the past 6 weeks. He was recently discharged last week from [facility] where he was worked up for myasthenia gravis and started on prednisone and Mestinon pending results of ACTH receptor antibodies. The ACTH receptor antibodies have come back positive.  Patient had been doing reasonably well until the morning of admission when he noted that he suddenly developed a complete inability to swallow.  Patient also reported about 25 pound weight loss.  In the ED patient was evaluated by neurology who recommended placement of NG tube so he could take the Mestinon. Unfortunately NG tube placement was eventful with possible placement in his trachea. Patient had episode of significant desaturation to the mid 50s with coughing and oxygen requirement thereafter.  The plan is for the interventional radiology to place a cortrak tube (NG tube).  Neurology team is directing care.  Plan is for steroids, Mestinon as well as IVIG.  03/08/2018: Patient seen alongside patient's wife.  Patient reports some improvement in his speech.  Patient is still n.p.o.  P from neurology is highly appreciated.  03/09/2018: Dysarthria has improved significantly.  Patient is still n.p.o.  Discussed with neurology team, Dr. Kathrynn Speed, patient will be discharged when dysphagia resolved as per Dr. Leonel Ramsay.   03/10/2018: Patient seen.  Patient continues to improve.  Speech therapy indicated evaluation is highly appreciated.  For barium swallow next week.  Dysarthria has improved significantly.  Dysphagia is improving, but aspiration risk persists.  Patient is still to be kept n.p.o.  03/11/2018: Patient  continues to improve.  Dysphagia is improving.  Diplopia is improving, but persists.  For modified barium swallow in the morning.  03/12/2018: Patient passed modified barium swallowing test today.  As per speech therapist recommendation, patient will be started on dysphagia 3 diet with thin liquid.  Will discontinue cotrak tube.  Will monitor patient overnight.  Will likely discharge patient home tomorrow.  Patient will need home health speech therapy on discharge.  Patient will follow with the neurology team on discharge.    Assessment & Plan:   Principal Problem:   Myasthenia gravis ([place]) Active Problems:   Sinus bradycardia   TIA (transient ischemic attack)   Gastro-esophageal reflux disease without esophagitis   Essential (primary) hypertension   Dysphagia  Myasthenia gravis:  Continue steroids, Mestinon and IVIG. Supportive care. Rule out thymoma as planned by neurology team. Further management depend on hospital course. 03/08/2018: Patient seen alongside patient's wife.  Patient reports some improvement in his speech.  Patient is still n.p.o.  P from neurology is highly appreciated. 03/09/2018: Dysarthria has improved significantly.  Patient is still n.p.o.  Discussed with neurology team, Dr. Kathrynn Speed, patient will be discharged when dysphagia resolved as per Dr. Leonel Ramsay. 03/10/2018: Patient seen.  Patient continues to improve.  Speech therapy indicated evaluation is highly appreciated.  For barium swallow next week.  Dysarthria has improved significantly.  Dysphagia is improving, but aspiration risk persists.  Patient is still to be kept n.p.o. 03/11/2018: Patient continues to improve.  Dysphagia is improving.  Diplopia is improving, but persists.  For modified barium swallow in the morning. 03/12/2018: Patient passed modified barium swallowing test today.  As per speech therapist recommendation, patient  will be started on dysphagia 3 diet with thin liquid.  Will discontinue  cotrak tube.  Will monitor patient overnight.  Will likely discharge patient home tomorrow.  Patient will need home health speech therapy on discharge.  Patient will follow with the neurology team on discharge.    DYSPHAGIA: Likely related to myasthenia gravis NG tube placed. Dietitian input is appreciated. 03/10/2018: Dysphagia is improving, but patient remains high risk for aspiration.  For barium swallow next week. 03/11/2018: Improving.  For modified barium swallow in the morning. 03/12/2018: Improved.  Patient passed modified barium swallowing test.  Speech therapist has recommended dysphagia 3 diet with thin liquid.  Continue aspiration precautions.  Hypokalemia: Replete and continue to monitor.  Potassium was 3.6 on 03/08/2018.  HYPERTENSION: 03/10/2018: Continue Hyzaar.  DC metoprolol.  Start Coreg 12.5 mg via NG tube twice daily.  Continue to monitor closely.  BP control has not been optimized.   HISTORY OF TIA: Continue aspirin  DVT prophylaxis: Subcu Lovenox Code Status: Full code Family Communication:  Disposition Plan: Will depend on hospital course   Consultants:   Neurology  Procedures:   None  Antimicrobials:   None   Subjective: Dysarthria has improved significantly. Dysphagia has improved significantly. Diplopia is improving, but persists.  Objective: Vitals:   03/11/18 2318 03/12/18 0337 03/12/18 0921 03/12/18 1146  BP: (!) 165/70 (!) 145/78 (!) 153/70 126/67  Pulse: (!) 50 (!) 51 (!) 56 (!) 58  Resp: 18 16 20 18   Temp: (!) 97.4 F (36.3 C) 97.8 F (36.6 C) 97.7 F (36.5 C)   TempSrc: Oral Oral Oral   SpO2: 94% 93% 91% 93%  Weight:      Height:        Intake/Output Summary (Last 24 hours) at 03/12/2018 1249 Last data filed at 03/12/2018 0800 Gross per 24 hour  Intake 450 ml  Output 700 ml  Net -250 ml   Filed Weights   03/08/18 0200 03/09/18 0800 03/11/18 0500  Weight: 86 kg 86 kg 85.5 kg    Examination:  General exam: Appears  calm and comfortable.  Respiratory system: Clear to auscultation.  Cardiovascular system: S1 & S2 heard. No pedal edema. Gastrointestinal system: Abdomen is nondistended, soft and nontender. No organomegaly or masses felt. Normal bowel sounds heard. Central nervous system: Alert and oriented.  Full range of movement.  Dysarthria noted.  Dysphagia reported.  Patient moves all limbs. Extremities: No leg edema.    Data Reviewed: I have personally reviewed following labs and imaging studies  CBC: Recent Labs  Lab 03/06/18 1009 03/06/18 1707 03/07/18 0402 03/08/18 0607  WBC 11.7* 10.8* 8.7 7.1  NEUTROABS 7.7  --   --  4.7  HGB 16.3 14.9 13.6 14.0  HCT 49.3 44.5 40.7 40.5  MCV 95.5 93.9 94.7 93.5  PLT 245 217 178 734   Basic Metabolic Panel: Recent Labs  Lab 03/06/18 1009 03/06/18 1707 03/07/18 0402 03/08/18 0607  NA 141  --  138 138  K 3.9  --  3.3* 3.6  CL 103  --  104 106  CO2 26  --  26 23  GLUCOSE 90  --  89 110*  BUN 23  --  22 20  CREATININE 1.12 1.13 0.91 0.99  CALCIUM 9.5  --  8.6* 8.5*  MG  --   --   --  2.1  PHOS  --   --   --  3.9   GFR: Estimated Creatinine Clearance: 64.4 mL/min (by C-G formula  based on SCr of 0.99 mg/dL). Liver Function Tests: Recent Labs  Lab 03/06/18 1009 03/08/18 0607  AST 27  --   ALT 27  --   ALKPHOS 65  --   BILITOT 1.3*  --   PROT 7.0  --   ALBUMIN 4.3 3.0*   No results for input(s): LIPASE, AMYLASE in the last 168 hours. No results for input(s): AMMONIA in the last 168 hours. Coagulation Profile: Recent Labs  Lab 03/06/18 1009  INR 1.02   Cardiac Enzymes: No results for input(s): CKTOTAL, CKMB, CKMBINDEX, TROPONINI in the last 168 hours. BNP (last 3 results) No results for input(s): PROBNP in the last 8760 hours. HbA1C: No results for input(s): HGBA1C in the last 72 hours. CBG: Recent Labs  Lab 03/11/18 2008 03/11/18 2353 03/12/18 0335 03/12/18 0914 03/12/18 1147  GLUCAP 166* 148* 103* 101* 164*   Lipid  Profile: No results for input(s): CHOL, HDL, LDLCALC, TRIG, CHOLHDL, LDLDIRECT in the last 72 hours. Thyroid Function Tests: No results for input(s): TSH, T4TOTAL, FREET4, T3FREE, THYROIDAB in the last 72 hours. Anemia Panel: No results for input(s): VITAMINB12, FOLATE, FERRITIN, TIBC, IRON, RETICCTPCT in the last 72 hours. Urine analysis:    Component Value Date/Time   COLORURINE YELLOW 02/28/2018 0318   APPEARANCEUR CLEAR 02/28/2018 0318   LABSPEC 1.024 02/28/2018 0318   PHURINE 5.0 02/28/2018 0318   GLUCOSEU NEGATIVE 02/28/2018 0318   HGBUR NEGATIVE 02/28/2018 0318   BILIRUBINUR NEGATIVE 02/28/2018 0318   KETONESUR NEGATIVE 02/28/2018 0318   PROTEINUR NEGATIVE 02/28/2018 0318   UROBILINOGEN 0.2 08/23/2014 2359   NITRITE NEGATIVE 02/28/2018 0318   LEUKOCYTESUR NEGATIVE 02/28/2018 0318   Sepsis Labs: @LABRCNTIP (procalcitonin:4,lacticidven:4)  ) No results found for this or any previous visit (from the past 240 hour(s)).       Radiology Studies: Dg Swallowing Func-speech Pathology  Result Date: 03/12/2018 Objective Swallowing Evaluation: Type of Study: MBS-Modified Barium Swallow Study  Patient Details Name: KENDRICKS REAP MRN: [id number] Date of Birth: [birth year] Today's Date: 03/12/2018 Time: SLP Start Time (ACUTE ONLY): 0815 -SLP Stop Time (ACUTE ONLY): 0830 SLP Time Calculation (min) (ACUTE ONLY): 15 min Past Medical History: Past Medical History: Diagnosis Date . BPH with urinary obstruction  . GERD (gastroesophageal reflux disease)   ESOPHAGEAL REFLUX HX . History of balanitis  . History of phimosis  . History of repair of rotator cuff  . Hx of biopsy   PENIS CUTANEOUS HX . Hypercholesteremia  . Hypertension  . Macular hole   AIR FLUID EXCHANGE  . Myasthenia gravis ([place]) 02/2018 . Organic impotence  . Prostate cancer [facility])  Past Surgical History: Past Surgical History: Procedure Laterality Date . APPENDECTOMY   . PARS PLANA VITRECTOMY W/ REPAIR OF MACULAR HOLE    x3 . ROTATOR  CUFF REPAIR Right  . UVULOPLASTY    LASER ASSISTED HPI: Pt is an 78 y.o. male with past medical history significant for hypertension, GERD, prostate cancer and essential tremor who has developed diplopia and dysphagia over the past 6 weeks. He was recently discharged last week from [facility] where he was worked up for myasthenia gravis and started on prednisone and Mestinon pending results of ACTH receptor antibodies. Patient had been doing reasonably well until the morning of 03/06/18 when he noted that he suddenly developed a complete inability to swallow. Patient indicated in ED that he tolerated dinner last night as long as he ate slowly however this morning he was unable to drink even more than 2-3 sips of  coffee. He was evaluated by evaluated by neurology in the ED and NG tube was recommended so he could take the Mestinon. However, NG tube placement was unsucessful with possible placement in his trachea. Patient had episode of significant desaturation to the mid 50s with coughing and oxygen requirement thereafter.  Subjective: Pt seen in radiology for outpatient MBS. Wife present Assessment / Plan / Recommendation CHL IP CLINICAL IMPRESSIONS 03/12/2018 Clinical Impression Pt's swallow function has improved compared to that which was noted on 03/07/18. He now presents with mild-moderate pharyngeal dysphagia characterized by reduced hyolaryngeal elevation and excursion which resulted in reduced epiglottic retroversion, transient penetration (PAS 2) with thin liquids, and pyriform sinus residue. Mild-moderate vallecular residue was also noted secondary to reduced lingual retraction. A chin tuck posture was effective in facilitating epiglottic retroversion, eliminating penetration, and reducing pharyngeal residue. A liquid wash further reduced pharyngeal residue to a more functional level. It is recommended that a dysphagia 3 diet with thin liquids be initiated at this time with strict observance of swallowing  precautions. SLP will follow pt to ensure tolerance of the recommended diet and it is recommended that outpatient SLP services for dysphagia be continued following discharge.  SLP Visit Diagnosis Dysphagia, pharyngeal phase (R13.13) Attention and concentration deficit following -- Frontal lobe and executive function deficit following -- Impact on safety and function Mild aspiration risk   CHL IP TREATMENT RECOMMENDATION 03/12/2018 Treatment Recommendations Therapy as outlined in treatment plan below   Prognosis 03/12/2018 Prognosis for Safe Diet Advancement Good Barriers to Reach Goals -- Barriers/Prognosis Comment -- CHL IP DIET RECOMMENDATION 03/12/2018 SLP Diet Recommendations Dysphagia 3 (Mech soft) solids;Thin liquid Liquid Administration via Cup;Straw Medication Administration Crushed with puree Compensations Slow rate;Small sips/bites;Chin tuck;Follow solids with liquid Postural Changes Seated upright at 90 degrees;Remain semi-upright after after feeds/meals (Comment)   CHL IP OTHER RECOMMENDATIONS 03/12/2018 Recommended Consults -- Oral Care Recommendations Oral care BID;Patient independent with oral care Other Recommendations --   CHL IP FOLLOW UP RECOMMENDATIONS 03/12/2018 Follow up Recommendations Outpatient SLP   CHL IP FREQUENCY AND DURATION 03/12/2018 Speech Therapy Frequency (ACUTE ONLY) min 2x/week Treatment Duration 2 weeks      CHL IP ORAL PHASE 03/12/2018 Oral Phase WFL Oral - Pudding Teaspoon -- Oral - Pudding Cup -- Oral - Honey Teaspoon -- Oral - Honey Cup -- Oral - Nectar Teaspoon -- Oral - Nectar Cup -- Oral - Nectar Straw -- Oral - Thin Teaspoon -- Oral - Thin Cup -- Oral - Thin Straw -- Oral - Puree -- Oral - Mech Soft -- Oral - Regular -- Oral - Multi-Consistency -- Oral - Pill -- Oral Phase - Comment --  CHL IP PHARYNGEAL PHASE 03/12/2018 Pharyngeal Phase Impaired Pharyngeal- Pudding Teaspoon -- Pharyngeal -- Pharyngeal- Pudding Cup -- Pharyngeal -- Pharyngeal- Honey Teaspoon -- Pharyngeal --  Pharyngeal- Honey Cup -- Pharyngeal -- Pharyngeal- Nectar Teaspoon -- Pharyngeal -- Pharyngeal- Nectar Cup -- Pharyngeal -- Pharyngeal- Nectar Straw Pharyngeal residue - pyriform;Reduced epiglottic inversion Pharyngeal -- Pharyngeal- Thin Teaspoon -- Pharyngeal -- Pharyngeal- Thin Cup -- Pharyngeal -- Pharyngeal- Thin Straw Reduced epiglottic inversion;Penetration/Aspiration during swallow;Pharyngeal residue - pyriform Pharyngeal Material enters airway, remains ABOVE vocal cords then ejected out Pharyngeal- Puree Pharyngeal residue - valleculae;Pharyngeal residue - pyriform;Reduced epiglottic inversion;Compensatory strategies attempted (with notebox) Pharyngeal -- Pharyngeal- Mechanical Soft Pharyngeal residue - valleculae;Pharyngeal residue - pyriform;Reduced epiglottic inversion;Compensatory strategies attempted (with notebox) Pharyngeal -- Pharyngeal- Regular Pharyngeal residue - valleculae;Pharyngeal residue - pyriform;Reduced epiglottic inversion;Compensatory strategies attempted (with notebox);Other (Comment) Pharyngeal -- Pharyngeal-  Multi-consistency -- Pharyngeal -- Pharyngeal- Pill -- Pharyngeal -- Pharyngeal Comment --  CHL IP CERVICAL ESOPHAGEAL PHASE 03/12/2018 Cervical Esophageal Phase WFL Pudding Teaspoon -- Pudding Cup -- Honey Teaspoon -- Honey Cup -- Nectar Teaspoon -- Nectar Cup -- Nectar Straw -- Thin Teaspoon -- Thin Cup -- Thin Straw -- Puree -- Mechanical Soft -- Regular -- Multi-consistency -- Pill -- Cervical Esophageal Comment -- Shanika I. Hardin Negus, [place], [place] Office number [phone] Pager [place] 03/12/2018, 9:32 AM                   Scheduled Meds: . aspirin  81 mg Per NG tube Daily  . atorvastatin  20 mg Per NG tube Daily  . carvedilol  12.5 mg Oral BID WC  . enoxaparin (LOVENOX) injection  40 mg Subcutaneous Q24H  . feeding supplement (PRO-STAT SUGAR FREE 64)  30 mL Per Tube Daily  . hydrochlorothiazide  25 mg Oral  Daily  . losartan  100 mg Oral Daily  . oxybutynin  5 mg Per Tube TID  . pantoprazole  40 mg Oral Daily  . predniSONE  60 mg Oral Q breakfast  . pyridostigmine  30 mg Oral Q8H  . sodium chloride flush  3 mL Intravenous Once   Continuous Infusions: . dextrose 50 mL/hr at 03/12/18 0000  . feeding supplement (OSMOLITE 1.5 CAL) 1,000 mL (03/11/18 1620)     LOS: 6 days    Time spent: 25 Minutes.   Dana Allan, MD  [facility] Pager #: [phone] 7PM-7AM contact night coverage as above

## 2018-03-12 NOTE — Progress Notes (Signed)
NIF and VC performed with Pt. Pt gave good effort.   NIF -40  VC 2.7 L

## 2018-03-13 ENCOUNTER — Ambulatory Visit: Payer: Medicare Other | Admitting: Speech Pathology

## 2018-03-13 LAB — GLUCOSE, CAPILLARY
Glucose-Capillary: 105 mg/dL — ABNORMAL HIGH (ref 70–99)
Glucose-Capillary: 107 mg/dL — ABNORMAL HIGH (ref 70–99)
Glucose-Capillary: 172 mg/dL — ABNORMAL HIGH (ref 70–99)
Glucose-Capillary: 211 mg/dL — ABNORMAL HIGH (ref 70–99)

## 2018-03-13 LAB — CREATININE, SERUM
Creatinine, Ser: 1.06 mg/dL (ref 0.61–1.24)
GFR calc Af Amer: >60 mL/min (ref >60)
GFR calc non Af Amer: >60 mL/min (ref >60)

## 2018-03-13 MED ORDER — PYRIDOSTIGMINE BROMIDE 60 MG PO TABS: 30.0000 mg | ORAL_TABLET | Freq: Three times a day (TID) | ORAL | 0 refills | Status: DC

## 2018-03-13 MED ORDER — METFORMIN HCL 500 MG PO TABS: 500.0000 mg | ORAL_TABLET | Freq: Two times a day (BID) | ORAL | 0 refills | Status: DC

## 2018-03-13 MED ORDER — CARVEDILOL 12.5 MG PO TABS: 12.5000 mg | ORAL_TABLET | Freq: Two times a day (BID) | ORAL | 0 refills | Status: AC

## 2018-03-13 MED ORDER — PREDNISONE 20 MG PO TABS: 60.0000 mg | ORAL_TABLET | Freq: Every day | ORAL | 0 refills | Status: DC

## 2018-03-13 NOTE — Progress Notes (Signed)
Pt CBG at 1130 was 211, nurse rechecked at 1208 and it was 172. Pt ate waffles, oatmeal, and coffee for breakfast. Dr. Marthenia Rolling notified. Nurse will continue to monitor. [name]

## 2018-03-13 NOTE — Care Management Important Message (Signed)
Important Message  Patient Details  Name: Travis Morris MRN: [id number] Date of Birth: Jun 15, 1940   Medicare Important Message Given:  Yes    Orbie Pyo 03/13/2018, 3:45 PM

## 2018-03-13 NOTE — Progress Notes (Signed)
Patient is being discharged home. Wife is here for transport. All discharge paperwork went over this patient and wife. All questions and concerns addressed. Patient to be taken down in wheelchair. [place]

## 2018-03-13 NOTE — Progress Notes (Signed)
RT NOTE: NIF/VC completed by patient with good effort with following results:  NIF: Greater than -40.  VC: 2.2L

## 2018-03-13 NOTE — Discharge Summary (Addendum)
Physician Discharge Summary  Patient ID: Travis Morris MRN: [id number] DOB/AGE: [birth year] 78 y.o.  Admit date: 03/06/2018 Discharge date: 03/13/2018  Admission Diagnoses:  Discharge Diagnoses:  Principal Problem:   Myasthenia gravis ([place])  Diplopia, likely secondary to myasthenia gravis. Active Problems:   Sinus bradycardia   TIA (transient ischemic attack)   Gastro-esophageal reflux disease without esophagitis   Essential (primary) hypertension   Dysphagia   Discharged Condition: stable  Hospital Course:  Travis A Wallaceis an 78 year oldmale,with past medical history significant for hypertension, GERD, prostate cancer and essential tremor.  Patient presented with 6-week history of diplopia, dysarthria and dysphagia.  Apparently, patient was recently discharged a week prior to presentation from [facility] where patient was worked up for myasthenia gravis and started on prednisone and Mestinon pending results of ACTH receptor antibodies. The ACTH receptor antibodies have come back positive.  Patient was doing reasonably well until the morning of admission when patient noted that he suddenly developed dysphagia (complete inability to swallow).  Patient also reported about 25 pound weight loss.  Patient was admitted for further assessment and management.  Neurology team was consulted to assist with patient's management.  Patient received 5-day course of IVIG.  Patient was also managed with prednisone and Mestinon.  Cortrak (NG tube) was placed for tube feeds and administration of medication.  He has gradually improved.  Dysarthria has improved significantly.  Dysphagia has gradually improved.  Patient underwent repeat modified barium swallow test today prior to discharge, and dysphagia 3 diet and thin liquid were recommended.  NG tube has been discontinued.  Patient was monitored overnight.  Patient has continued to do well, and will be followed by speech therapy on discharge.   Diplopia persists.  Patient will be discharged back come to the care of the primary care team and neurology.  Physical therapy will also follow patient on discharge.  Myasthenia gravis:  Patient was admitted and managed with 5-day course of IVIG, steroids and Mestinon.  Patient will continue on steroids and Mestinon on discharge. Dysphagia has resolved significantly. Dysarthria has resolved significantly. Diplopia persists. Patient will be discharged to the care of the primary care provider and the neurologist.    DYSPHAGIA: Likely related to myasthenia gravis. NG tube was placed on presentation. With improvement in dysphagia, NG tube was discontinued. Patient is currently on dysphagia 3 diet with thin liquids. Continue aspiration precautions. Follow up with speech therapy on discharge.  Hypokalemia: Repleted Continue to monitor.    HYPERTENSION: Continue to monitor and optimize on discharge.  HISTORY OF TIA: Continue aspirin  Consults: neurology  Significant Diagnostic Studies:  CT chest with contrast: Moderate circumferential wall thickening is seen involving the superior portion of the thoracic esophagus; it is uncertain if this is inflammatory or neoplastic in origin. Endoscopy is recommended for further evaluation.  Aortic Atherosclerosis  Discharge Exam: Blood pressure (!) 157/69, pulse (!) 53, temperature 97.6 F (36.4 C), temperature source Oral, resp. rate 18, height 5\' 8"  (1.727 m), weight 85.5 kg, SpO2 91 %.  Disposition: Discharge disposition: 06-Home-Health Care Svc   Discharge Instructions    Diet - low sodium heart healthy   Complete by:  As directed    Dysphagia 3 diet, thin liquid, aspiration precautions   Increase activity slowly   Complete by:  As directed      Allergies as of 03/13/2018   No Known Allergies     Medication List    STOP taking these medications   metoprolol succinate  50 MG 24 hr tablet Commonly known as:   TOPROL-XL     TAKE these medications   aspirin 81 MG tablet Take 81 mg by mouth daily.   atorvastatin 20 MG tablet Commonly known as:  LIPITOR Take 20 mg by mouth daily.   carvedilol 12.5 MG tablet Commonly known as:  COREG Take 1 tablet (12.5 mg total) by mouth 2 (two) times daily with a meal.   losartan-hydrochlorothiazide 100-25 MG tablet Commonly known as:  HYZAAR Take 1 tablet by mouth daily.   MULTIVITAMINS PO Take 1 tablet by mouth daily. CENTRUM SILVER   omeprazole 20 MG capsule Commonly known as:  PRILOSEC Take 20 mg by mouth daily.   oxybutynin 5 MG tablet Commonly known as:  DITROPAN Take 5 mg by mouth 3 (three) times daily.   predniSONE 20 MG tablet Commonly known as:  DELTASONE Take 3 tablets (60 mg total) by mouth daily with breakfast for 30 days. Start taking on:  March 14, 2018 What changed:    medication strength  how much to take  how to take this  when to take this  additional instructions   pyridostigmine 60 MG tablet Commonly known as:  MESTINON Take 0.5 tablets (30 mg total) by mouth 3 (three) times daily. What changed:  how much to take   vitamin C 100 MG tablet Take 100 mg by mouth daily.   vitamin E 400 UNIT capsule Take 400 Units by mouth daily.      Addendum: Blood sugar was noted to be elevated, ranging from 170-200 ranges.  Will start patient on metformin 500 Mg p.o. twice daily on discharge.  I have given prescription for metformin for the next 21 Days.  Patient will need to check blood sugar before meals at bedtime.  Patient will need to call primary care provider for blood sugar greater than 250 or less than 110.  SignedBonnell Public 03/13/2018, 10:17 AM

## 2018-03-13 NOTE — Care Management Note (Signed)
Case Management Note  Patient Details  Name: Travis Morris MRN: [id number] Date of Birth: 1940-02-27  Subjective/Objective:      Pt admitted with Myasthenia Gravis. He is from home with spouse.              Action/Plan: Pt is discharging home with orders for [facility] services. CM provided him choice and he states he prefers to attend [employer]. MD updated and orders placed in Epic. Information on the AVS. Pt has transportation home.   Expected Discharge Date:  03/13/18               Expected Discharge Plan:  OP Rehab  In-House Referral:     Discharge planning Services  CM Consult  Post Acute Care Choice:    Choice offered to:     DME Arranged:    DME Agency:     HH Arranged:    HH Agency:     Status of Service:  Completed, signed off  If discussed at [employer] of Stay Meetings, dates discussed:    Additional Comments:  Pollie Friar, RN 03/13/2018, 2:04 PM

## 2018-03-13 NOTE — Progress Notes (Signed)
  Speech Language Pathology Treatment:    Patient Details Name: Travis Morris MRN: [id number] DOB: [birth year] Today's Date: 03/13/2018 Time: 0939-1000 SLP Time Calculation (min) (ACUTE ONLY): 21 min  Assessment / Plan / Recommendation Clinical Impression  Pt was seen for dysphagia treatment to facilite his use of compensatory strategies and ensure continued tolerance of the recommended diet. He consumed a meal of oatmeal, pancakes, ground sausage, and thin liquids via cup and straw. He tolerated solids and liquids without overt s/sx of aspiration. Pt continues to independently reduce bolus sizes. Occasional cues were needed for use a liquid wash and minimal-moderate cues for consistent use of the chin tuck posture. Pt has demonstrated improved independence with use of compensatory strategies during this meal and was able to verbalize all the necessary swallowing precautions. His discharge is scheduled for today and he has agreed to follow up with outpatient SLP.    HPI HPI: Pt is an 78 y.o. male with past medical history significant for hypertension, GERD, prostate cancer and essential tremor who has developed diplopia and dysphagia over the past 6 weeks. He was recently discharged last week from [facility] where he was worked up for myasthenia gravis and started on prednisone and Mestinon pending results of ACTH receptor antibodies. Patient had been doing reasonably well until the morning of 03/06/18 when he noted that he suddenly developed a complete inability to swallow. Patient indicated in ED that he tolerated dinner last night as long as he ate slowly however this morning he was unable to drink even more than 2-3 sips of coffee. He was evaluated by evaluated by neurology in the ED and NG tube was recommended so he could take the Mestinon. However, NG tube placement was unsucessful with possible placement in his trachea. Patient had episode of significant desaturation to the mid 50s with coughing  and oxygen requirement thereafter.      SLP Plan  Continue with current plan of care       Recommendations  Diet recommendations: Dysphagia 3 (mechanical soft);Thin liquid Liquids provided via: Cup;Straw Medication Administration: Crushed with puree Supervision: Patient able to self feed;Intermittent supervision to cue for compensatory strategies Compensations: Slow rate;Small sips/bites;Chin tuck;Follow solids with liquid Postural Changes and/or Swallow Maneuvers: Seated upright 90 degrees;Upright 30-60 min after meal;Chin tuck                Oral Care Recommendations: Oral care BID Follow up Recommendations: Outpatient SLP SLP Visit Diagnosis: Dysphagia, pharyngeal phase (R13.13) Plan: Continue with current plan of care       Yamileth Hayse I. Hardin Negus, [place], [place] Office number [phone] Pager [place] 03/13/2018, 9:55 AM

## 2018-03-15 ENCOUNTER — Ambulatory Visit: Payer: Medicare Other | Admitting: Speech Pathology

## 2018-03-15 ENCOUNTER — Encounter: Payer: Self-pay | Admitting: Speech Pathology

## 2018-03-15 DIAGNOSIS — R1313 Dysphagia, pharyngeal phase: Secondary | ICD-10-CM | POA: Diagnosis not present

## 2018-03-15 DIAGNOSIS — R1314 Dysphagia, pharyngoesophageal phase: Secondary | ICD-10-CM | POA: Diagnosis not present

## 2018-03-15 NOTE — Patient Instructions (Signed)
   Sit up right  Small bites/sips  Chin tuck  Fork down  Follow solids with liquid sips  Meds in puree (applesauce, pudding or yogurt) they did recommend crushed at the hospital with Alesia Banda  Try to avoid distractions with meals, think about limiting TV  Think about higher protein health fat foods  Mixed consistency foods may be hard (Cereal and milk; soups with meal or noodles, fruit cocktails) - see how you do  4-6 small meals - soft solids  With MG, your muscles can fatigue as the meal goes on or if you are already fatigued you may have more difficulty on one day than another  Look up Mechanical soft diet online for some ideas

## 2018-03-15 NOTE — Therapy (Signed)
[facility] [address] [place], [place], [address] Phone: [phone]   Fax:  [phone]  Speech Language Pathology Treatment  Patient Details  Name: Travis Morris MRN: [id number] Date of Birth: Jul 07, 1940 Referring Provider (SLP): Wonda Horner, MD   Encounter Date: 03/15/2018  End of Session - 03/15/18 1529    Visit Number  7    Number of Visits  17    Date for SLP Re-Evaluation  05/08/18    SLP Start Time  1316    SLP Stop Time   56    SLP Time Calculation (min)  42 min    Activity Tolerance  Patient tolerated treatment well       Past Medical History:  Diagnosis Date  . BPH with urinary obstruction   . GERD (gastroesophageal reflux disease)    ESOPHAGEAL REFLUX HX  . History of balanitis   . History of phimosis   . History of repair of rotator cuff   . Hx of biopsy    PENIS CUTANEOUS HX  . Hypercholesteremia   . Hypertension   . Macular hole    AIR FLUID EXCHANGE   . Myasthenia gravis ([place]) 02/2018  . Organic impotence   . Prostate cancer [facility])     Past Surgical History:  Procedure Laterality Date  . APPENDECTOMY    . PARS PLANA VITRECTOMY W/ REPAIR OF MACULAR HOLE     x3  . ROTATOR CUFF REPAIR Right   . UVULOPLASTY     LASER ASSISTED    There were no vitals filed for this visit.  Subjective Assessment - 03/15/18 1328    Subjective  "they said I have Myasthenia Gravis"    Patient is accompained by:  Family member   wife   Currently in Pain?  No/denies            ADULT SLP TREATMENT - 03/15/18 1329      General Information   Behavior/Cognition  Alert;Cooperative;Pleasant mood;Requires cueing    Patient Positioning  Upright in chair      Treatment Provided   Treatment provided  Cognitive-Linquistic      Dysphagia Treatment   Temperature Spikes Noted  No    Respiratory Status  Room air    Oral Cavity - Dentition  Adequate natural dentition    Treatment Methods   Skilled observation;Therapeutic exercise;Patient/caregiver education    Patient observed directly with PO's  Yes    Type of PO's observed  Thin liquids    Liquids provided via  Cup    Type of cueing  Verbal;Visual    Amount of cueing  Minimal    Other treatment/comments  Reviewed results of MBSS with pt and spouse. Spouse reports pt has been "fairly good" with remembering his chin tuck and alternating solids with liquids. Pt has elected to continue to watch TV during meals despite recommendation to elimiate distractions. Educated pt and spouse that pt's swallowing muscles may fatigue with longer meals due to Myasthenia Gravis. Trained spouse to provided smaller meals of 1/2 meals 4-6 times a day to facilitate ease and safety of swallow. Pt and spouse did not recall recommendation for meds crushed in puree. We reviewed this and they decided pt would use chocolate pudding for his meds. Pt and spouse also trained to use chin tucck and liquid wash with meds as well. Pt reports difficulty with chicken soup last night. Educated pt and spouse re: mixed consistencies and trained them to use caution  and chin tuck if he was goingn to attempt mixed consistencies. Encouraged sposue to use food chopped to make tuna salad, chidken salads etc as fine as possible. Pt demonstrated adequate chin tuck 5/5 liquid sips with rare min A. He states "they told me to put my chin all the way down on my chest"      Pain Assessment   Pain Assessment  No/denies pain      Assessment / Recommendations / Plan   Plan  Continue with current plan of care      Dysphagia Recommendations   Diet recommendations  Dysphagia 3 (mechanical soft);Thin liquid    Liquids provided via  Straw;Cup    Medication Administration  Crushed with puree    Supervision  Patient able to self feed    Compensations  Slow rate;Small sips/bites    Postural Changes and/or Swallow Maneuvers  Seated upright 90 degrees;Chin tuck      Progression Toward Goals    Progression toward goals  Progressing toward goals       SLP Education - 03/15/18 1523    Education Details  revised swallow precautions - including with meds; 4-6 smaller meals    Person(s) Educated  Patient    Methods  Explanation;Demonstration;Handout    Comprehension  Verbalized understanding;Returned demonstration;Verbal cues required;Need further instruction       SLP Short Term Goals - 03/15/18 1528      SLP SHORT TERM GOAL #1   Title  pt will follow swallow precautions with rare min A in 2 sessions    Time  2    Period  Weeks    Status  Achieved      SLP SHORT TERM GOAL #2   Title  pt will perform swallowing HEP with rare min A in 2 sessions    Time  1    Period  Weeks    Status  Deferred      SLP SHORT TERM GOAL #3   Title  pt will provide SLP 3 overt s/s aspiration PNA with modified independence in 3 sessions    Time  1    Period  Weeks    Status  On-going       SLP Long Term Goals - 03/15/18 1529      SLP LONG TERM GOAL #1   Title  pt will complete HEP for dysphagia with modified independence in 3 sessions    Time  5    Period  Weeks   or 17 sessions, for all LTGs   Status  On-going      SLP LONG TERM GOAL #2   Title  pt will follow swallow precautions with POs with modified independence over three sessions    Time  5    Period  Weeks    Status  On-going       Plan - 03/15/18 1524    Clinical Impression Statement  Pt returns after brief hospitalization where he was dx with Myasthenia Gravis. He had several MBSS while hospitalized. Most recent recs are above. Initiated training of new swallow precautions, including chin tuck and crushed meds with puree. Instructed pt in small more frequent meals and to d/c HEP for now, due to muscular fatigue with MG. Continue skilled ST to maximize safety of swallow. Pt missed visits due to hospitalization, so 2 additional visits were added.    Speech Therapy Frequency  2x / week    Duration  --   6 weeks or 13  sessions  Treatment/Interventions  Aspiration precaution training;Pharyngeal strengthening exercises;Diet toleration management by SLP;Group dysphagia treatment;Trials of upgraded texture/liquids;Compensatory strategies;Environmental controls;SLP instruction and feedback    Potential to Achieve Goals  Good       Patient will benefit from skilled therapeutic intervention in order to improve the following deficits and impairments:   Dysphagia, pharyngeal phase    Problem List Patient Active Problem List   Diagnosis Date Noted  . Myasthenia gravis ([place]) 03/06/2018  . Dysphagia 03/06/2018  . Diplopia 02/28/2018  . Essential tremor 08/11/2015  . Male erectile dysfunction 07/16/2015  . Other abnormal glucose 07/16/2015  . Hyperlipidemia 07/16/2015  . Gastro-esophageal reflux disease without esophagitis 07/16/2015  . Essential (primary) hypertension 07/16/2015  . Malignant neoplasm of prostate ([place]) 07/16/2015  . Tremor 07/16/2015  . Vertigo 08/24/2014  . History of hypercholesterolemia 08/24/2014  . Sinus bradycardia 08/24/2014  . TIA (transient ischemic attack)   . Prostate cancer [facility])     Fabiola Mudgett, Annye Rusk MS, CCC-SLP 03/15/2018, 3:30 PM  [facility] [address] [place], [place], [address] Phone: [phone]   Fax:  [phone]   Name: Travis Morris MRN: [id number] Date of Birth: [birth year]

## 2018-03-16 DIAGNOSIS — M5412 Radiculopathy, cervical region: Secondary | ICD-10-CM | POA: Diagnosis not present

## 2018-03-16 DIAGNOSIS — M542 Cervicalgia: Secondary | ICD-10-CM | POA: Diagnosis not present

## 2018-03-20 ENCOUNTER — Encounter: Payer: Self-pay | Admitting: Speech Pathology

## 2018-03-20 ENCOUNTER — Ambulatory Visit: Payer: Medicare Other | Attending: Gastroenterology | Admitting: Speech Pathology

## 2018-03-20 DIAGNOSIS — R2681 Unsteadiness on feet: Secondary | ICD-10-CM | POA: Diagnosis not present

## 2018-03-20 DIAGNOSIS — R2689 Other abnormalities of gait and mobility: Secondary | ICD-10-CM | POA: Diagnosis not present

## 2018-03-20 DIAGNOSIS — R1313 Dysphagia, pharyngeal phase: Secondary | ICD-10-CM | POA: Diagnosis not present

## 2018-03-20 NOTE — Therapy (Signed)
[facility] [address] [place], [place], [address] Phone: [phone]   Fax:  [phone]  Speech Language Pathology Treatment  Patient Details  Name: Travis Morris MRN: [id number] Date of Birth: 1940-10-29 Referring Provider (SLP): Wonda Horner, MD   Encounter Date: 03/20/2018  End of Session - 03/20/18 1213    Visit Number  8    Number of Visits  17    Date for SLP Re-Evaluation  05/08/18    SLP Start Time  0932    SLP Stop Time   4665    SLP Time Calculation (min)  42 min    Activity Tolerance  Patient tolerated treatment well       Past Medical History:  Diagnosis Date  . BPH with urinary obstruction   . GERD (gastroesophageal reflux disease)    ESOPHAGEAL REFLUX HX  . History of balanitis   . History of phimosis   . History of repair of rotator cuff   . Hx of biopsy    PENIS CUTANEOUS HX  . Hypercholesteremia   . Hypertension   . Macular hole    AIR FLUID EXCHANGE   . Myasthenia gravis ([place]) 02/2018  . Organic impotence   . Prostate cancer [facility])     Past Surgical History:  Procedure Laterality Date  . APPENDECTOMY    . PARS PLANA VITRECTOMY W/ REPAIR OF MACULAR HOLE     x3  . ROTATOR CUFF REPAIR Right   . UVULOPLASTY     LASER ASSISTED    There were no vitals filed for this visit.  Subjective Assessment - 03/20/18 0943    Subjective  "I am doing better, my double vision is gone and my swallowing is better with no choking"    Patient is accompained by:  Family member   sposue   Currently in Pain?  No/denies            ADULT SLP TREATMENT - 03/20/18 0944      General Information   Behavior/Cognition  Alert;Cooperative;Pleasant mood;Requires cueing      Treatment Provided   Treatment provided  Cognitive-Linquistic      Dysphagia Treatment   Temperature Spikes Noted  No    Respiratory Status  Room air    Oral Cavity - Dentition  Adequate natural dentition    Treatment  Methods  Skilled observation;Therapeutic exercise;Patient/caregiver education    Type of PO's observed  Thin liquids;Dysphagia 3 (soft)    Liquids provided via  Cup    Pharyngeal Phase Signs & Symptoms  Immediate cough   at end of session, immediate coughj 1/8 liquid sips   Type of cueing  Verbal;Visual    Amount of cueing  Moderate    Other treatment/comments  spouse reports pt is inconsistent with chin tuck and is not sure if he is putting his head down enough. Pt reports he is feeling stronger since starting his medicatoins for MG, and his double vision has resolved. Swallowing as improved also per pt report, with some increased endurance. Pt required occasional min A for amplitude of chin tuck and to alternate solids and liquids initially, however he progressed to supervision cues for remainder of trials. I re-instated modiied HEP as pt  has responded to medications. Pt demonstrated HEP of effortful swallow and [place] with rare min A. Sposue is concerned that pt is not getting enough protein on the soft diet. We discussed some soft protein options.  Pain Assessment   Pain Assessment  No/denies pain      Assessment / Recommendations / Plan   Plan  Continue with current plan of care      Dysphagia Recommendations   Diet recommendations  Dysphagia 3 (mechanical soft);Thin liquid    Liquids provided via  Straw;Cup    Medication Administration  Crushed with puree    Supervision  Patient able to self feed    Compensations  Slow rate;Small sips/bites;Follow solids with liquid    Postural Changes and/or Swallow Maneuvers  Seated upright 90 degrees;Chin tuck      Progression Toward Goals   Progression toward goals  Progressing toward goals       SLP Education - 03/20/18 1211    Education Details  use straw if that is easier for chin tuck, chin tuck with all swallows - solid, liquid, meds    Person(s) Educated  Patient;Spouse    Methods  Explanation;Demonstration;Handout;Verbal cues     Comprehension  Verbalized understanding;Returned demonstration;Verbal cues required;Need further instruction       SLP Short Term Goals - 03/20/18 1213      SLP SHORT TERM GOAL #1   Title  pt will follow swallow precautions with rare min A in 2 sessions    Time  1    Period  Weeks    Status  Achieved      SLP SHORT TERM GOAL #2   Title  pt will perform swallowing HEP with rare min A in 2 sessions    Time  1    Period  Weeks    Status  Achieved      SLP SHORT TERM GOAL #3   Title  pt will provide SLP 3 overt s/s aspiration PNA with modified independence in 3 sessions    Time  1    Period  Weeks    Status  Achieved       SLP Long Term Goals - 03/20/18 1213      SLP LONG TERM GOAL #1   Title  pt will complete HEP for dysphagia with modified independence in 3 sessions    Time  4    Period  Weeks   or 17 sessions, for all LTGs   Status  On-going      SLP LONG TERM GOAL #2   Title  pt will follow swallow precautions with POs with modified independence over three sessions    Time  4    Period  Weeks    Status  On-going       Plan - 03/20/18 1212    Clinical Impression Statement  Pt returns after brief hospitalization where he was dx with Myasthenia Gravis. He had several MBSS while hospitalized. Most recent recs are above. Initiated training of new swallow precautions, including chin tuck and crushed meds with puree. Instructed pt in small more frequent meals and to d/c HEP for now, due to muscular fatigue with MG. Continue skilled ST to maximize safety of swallow. Pt missed visits due to hospitalization, so 2 additional visits were added.    Speech Therapy Frequency  2x / week    Duration  --   6 weeks or 13 visits   Treatment/Interventions  Aspiration precaution training;Pharyngeal strengthening exercises;Diet toleration management by SLP;Group dysphagia treatment;Trials of upgraded texture/liquids;Compensatory strategies;Environmental controls;SLP instruction and  feedback    Potential to Achieve Goals  Good       Patient will benefit from skilled therapeutic intervention in order to improve  the following deficits and impairments:   Dysphagia, pharyngeal phase    Problem List Patient Active Problem List   Diagnosis Date Noted  . Myasthenia gravis ([place]) 03/06/2018  . Dysphagia 03/06/2018  . Diplopia 02/28/2018  . Essential tremor 08/11/2015  . Male erectile dysfunction 07/16/2015  . Other abnormal glucose 07/16/2015  . Hyperlipidemia 07/16/2015  . Gastro-esophageal reflux disease without esophagitis 07/16/2015  . Essential (primary) hypertension 07/16/2015  . Malignant neoplasm of prostate ([place]) 07/16/2015  . Tremor 07/16/2015  . Vertigo 08/24/2014  . History of hypercholesterolemia 08/24/2014  . Sinus bradycardia 08/24/2014  . TIA (transient ischemic attack)   . Prostate cancer [facility])     Akeiba Axelson, Annye Rusk MS, CCC-SLP 03/20/2018, 12:14 PM  [facility] [address] [place], [place], [address] Phone: [phone]   Fax:  [phone]   Name: ANDRZEJ SCULLY MRN: [id number] Date of Birth: 1940-07-07

## 2018-03-20 NOTE — Patient Instructions (Addendum)
  Add back some of your swallow exercises - if you need to modify it, do less repetitions or focus on effortful swallow and half swallow - those are the most important  Write down your questions for the neurologist (she)  You can use a straw if that is easier for the chin tuck - just take single sips with the straw  You must tuck your chin with each swallow and meds  Alternate solids and liquids  Doctor questions: Exercise - how much? Med build up tolerance Driving  Progression   [place] milk has more protien  Protein shakes

## 2018-03-21 ENCOUNTER — Encounter: Payer: Self-pay | Admitting: Neurology

## 2018-03-21 ENCOUNTER — Encounter: Payer: Self-pay | Admitting: *Deleted

## 2018-03-21 ENCOUNTER — Ambulatory Visit (INDEPENDENT_AMBULATORY_CARE_PROVIDER_SITE_OTHER): Payer: Medicare Other | Admitting: Neurology

## 2018-03-21 VITALS — BP 140/80 | HR 61 | Ht 68.0 in | Wt 185.4 lb

## 2018-03-21 DIAGNOSIS — G7001 Myasthenia gravis with (acute) exacerbation: Secondary | ICD-10-CM

## 2018-03-21 MED ORDER — PREDNISONE 20 MG PO TABS: 60.0000 mg | ORAL_TABLET | Freq: Every day | ORAL | 3 refills | Status: DC

## 2018-03-21 MED ORDER — SULFAMETHOXAZOLE-TRIMETHOPRIM 800-160 MG PO TABS: 1.0000 | ORAL_TABLET | ORAL | 2 refills | Status: DC

## 2018-03-21 MED ORDER — PYRIDOSTIGMINE BROMIDE 60 MG PO TABS: 30.0000 mg | ORAL_TABLET | Freq: Three times a day (TID) | ORAL | 3 refills | Status: DC

## 2018-03-21 NOTE — Patient Instructions (Addendum)
Continue prednsone 60mg  daily  Continue mestinon 30mg  three times daily as you are taking  Start bactrim 1 tablet Monday, Wednesday, Friday  Maintain calcium intake of 1200 mg/day and vitamin D intake of 800 international units/day through either diet and/or supplements  Start IVIG every 21 days  Follow-up with your primary care doctor to manage your blood sugar   Return to clinic on 3/24 at 3pm

## 2018-03-21 NOTE — Progress Notes (Signed)
Follow-up Visit   Date: 03/21/18   Travis Morris MRN: [id number] DOB: 07/12/1940   Interim History: Travis Morris is a 78 y.o. right-handed Caucasian male with hypertension, GERD, prostate cancer, and essential tremor (followed by Dr. Carles Collet) returning to the clinic with newly diagnosed seropositive myasthenia gravis.  The patient was accompanied to the clinic by wife who also provides collateral information.    History of present illness: Starting in early December 2019, he began noticing difficulty with swallowing and speaking.  He felt that he would tire quickly and it would take up to an hour for him to eat.  Wife says that he would have coughing and choking spells.  He had lost 25lb.  Later in February, he began having vertical diplopia which prompted him to go to the ER. He denies having droopy eyelids or weakness of the arms and legs.  There was concern for myasthenia gravis by in-patient neurology team and he was started on mestinon 60mg  three times daily, but he developed dizziness and it was reduced to 30mg  three times daily and told to follow-up with out-patient neurology.  Unfortunately, he was readmitted on 2/18 - 2/25 with progressive dysphagia and treated with IVIG x 5 days due to myasthenia crisis. He was also started on prednisone 60mg /d.   Swallowing function was impaired to the point where he needed a NG tube placement for feedings. Upon discharge, his speech had become stronger, but he continued to have double vision and difficulty swallowing.  Since being home, his double vision has resolved and he is coughing/choking much less.  He continues to eat only a soft diet.  He is able to swallow thin liquids and soft foods, but cannot eat hard textured foods.  He is supplementing meals with 1 bottle of boost.    He currently takes prednisone 60mg  daily and mestinon 30mg  at 9am, 2pm, and 6pm.  He denies shortness of breath, neck heaviness, droopy eyelids, or limb weakness.   His speech is back to baseline, however, swallowing is still not quite there.  Medications:  Current Outpatient Medications on File Prior to Visit  Medication Sig Dispense Refill  . Ascorbic Acid (VITAMIN C) 100 MG tablet Take 100 mg by mouth daily.    Marland Kitchen aspirin 81 MG tablet Take 81 mg by mouth daily.      Marland Kitchen atorvastatin (LIPITOR) 20 MG tablet Take 20 mg by mouth daily.    . carvedilol (COREG) 12.5 MG tablet Take 1 tablet (12.5 mg total) by mouth 2 (two) times daily with a meal. 60 tablet 0  . losartan-hydrochlorothiazide (HYZAAR) 100-25 MG tablet Take 1 tablet by mouth daily. 90 tablet 1  . metFORMIN (GLUCOPHAGE) 500 MG tablet Take 1 tablet (500 mg total) by mouth 2 (two) times daily with a meal for 21 days. 42 tablet 0  . Multiple Vitamin (MULTIVITAMINS PO) Take 1 tablet by mouth daily. CENTRUM SILVER     . omeprazole (PRILOSEC) 20 MG capsule Take 20 mg by mouth daily.      Marland Kitchen oxybutynin (DITROPAN) 5 MG tablet Take 5 mg by mouth 3 (three) times daily.    . vitamin E 400 UNIT capsule Take 400 Units by mouth daily.       No current facility-administered medications on file prior to visit.     Allergies: No Known Allergies  Review of Systems:  CONSTITUTIONAL: No fevers, chills, night sweats, or weight loss.  EYES: No visual changes or eye pain ENT: No  hearing changes.  No history of nose bleeds.   RESPIRATORY: No cough, wheezing and shortness of breath.   CARDIOVASCULAR: Negative for chest pain, and palpitations.   GI: Negative for abdominal discomfort, blood in stools or black stools.  No recent change in bowel habits.   GU:  No history of incontinence.   MUSCLOSKELETAL: No history of joint pain or swelling.  No myalgias.   SKIN: Negative for lesions, rash, and itching.   ENDOCRINE: Negative for cold or heat intolerance, polydipsia or goiter.   PSYCH:  No depression or anxiety symptoms.   NEURO: As Above.   Vital Signs:  BP 140/80   Pulse 61   Ht 5\' 8"  (1.727 m)   Wt 185 lb 6  oz (84.1 kg)   SpO2 95%   BMI 28.19 kg/m    General Medical Exam:   General:  Well appearing, comfortable  Eyes/ENT: see cranial nerve examination.   Neck:  No carotid bruits. Respiratory:  Clear to auscultation, good air entry bilaterally.   Cardiac:  Regular rate and rhythm, no murmur.   Ext:  No edema   Neurological Exam: MENTAL STATUS including orientation to time, place, person, recent and remote memory, attention span and concentration, language, and fund of knowledge is normal.  Speech is not dysarthric.  CRANIAL NERVES:  No visual field defects.  Pupils equal round and reactive to light.  Normal conjugate, extra-ocular eye movements in all directions of gaze.  No ptosis at rest or with sustained upgaze.  Face is symmetric. Facial muscles are 5/5 including frontalis, orbicularis oculi, orbicularis oris, and buccinator is 5-/5.  Palate elevates symmetrically.  Tongue is midline and 5/5, side-to-side movements are normal.  MOTOR:  Motor strength is 5/5 in all extremities, without fatiguability.  Bilateral intentional tremor of the hands.  No atrophy.  No pronator drift.  Tone is normal.    MSRs:  Reflexes are 2+/4 throughout, except absent at the Achilles.  SENSORY:  Absent vibration at the ankles bilaterally.  COORDINATION/GAIT:  Normal finger-to- nose-finger.  He is able to stand up without using arms to push off.  Gait narrow based and stable.   Data: MRI brain 02/28/2018: 1. No acute intracranial abnormality. 2. Mild chronic small vessel ischemic disease  CT chest with contrast 03/06/2018:  Moderate circumferential wall thickening is seen involving the superior portion of the thoracic esophagus; it is uncertain if this is inflammatory or neoplastic in origin. Endoscopy is recommended for further evaluation. Aortic Atherosclerosis  Labs 02/28/2018:  AChR blocking 60*, modulating 63*, binding <0.03  Lab Results  Component Value Date   HGBA1C 6.3 (H) 02/28/2018    IMPRESSION/PLAN: Seropositive myasthenia gravis with exacerbation manifesting with bulbar weakness.  He underwent treatment with IVIG x 5 days and started on prednisone 60mg /d which he has been on for almost 3 weeks which has markedly improved dysarthria, resolved diplopia, but he continues to have dysphagia and remains on soft diet.  His exam does not disclose weakness of the arms or legs and overall bulbar strength is intact.  Because of the severity of his dysphagia on presentation, I will need to be cautious taping his prednisone to avoid risk of exacerbation.  Therefore, I will start maintenance therapy of IVIG 1g/kg over 1-2 days every 21 days. Continue prednisone 60mg  and plan to slowly taper at his next visit.  Patient was counseled on the potential adverse effects of high-dose steroid therapy including, but not limited to hypertension, secondary diabetes mellitus, susceptibility to infections,  anxiety, insomnia, weight gain, bone issues (including avascular necrosis and osteoporotic changes) and cataracts. Continue mestinon 30mg  three times daily Start calcium supplementation Start bactrim DS MWF Start prilosec 20mg  daily Follow-up with PCP to manage steroid-induced hyperglycemia   Patient wanted to discuss his neuropathy, but due to complexity of myasthenia and patient education on this disease, there was not enough time to discuss any other problems.  I will be happy to address his neuropathy at later visits when his myasthenia is better-controlled.  Return to clinic in 3 weeks  Greater than 50% of this 60 minute visit was spent in counseling, explanation of diagnosis, planning of further management, and coordination of care.   Thank you for allowing me to participate in patient's care.  If I can answer any additional questions, I would be pleased to do so.    Sincerely,    Donika K. Posey Pronto, DO

## 2018-03-21 NOTE — Addendum Note (Signed)
Addended by: Chester Holstein on: 03/21/2018 01:29 PM   Modules accepted: Orders

## 2018-03-23 ENCOUNTER — Encounter: Payer: Self-pay | Admitting: Physical Therapy

## 2018-03-23 ENCOUNTER — Ambulatory Visit: Payer: Medicare Other | Admitting: Physical Therapy

## 2018-03-23 ENCOUNTER — Ambulatory Visit: Payer: Medicare Other

## 2018-03-23 DIAGNOSIS — R1313 Dysphagia, pharyngeal phase: Secondary | ICD-10-CM | POA: Diagnosis not present

## 2018-03-23 DIAGNOSIS — R2681 Unsteadiness on feet: Secondary | ICD-10-CM

## 2018-03-23 DIAGNOSIS — R2689 Other abnormalities of gait and mobility: Secondary | ICD-10-CM

## 2018-03-23 NOTE — Patient Instructions (Signed)
Tandem Stance   Right foot in front of left, heel touching toe both feet "straight ahead". Stand on Foot Triangle of Support with both feet. Balance in this position _up to 30__ seconds. Do with left foot in front of right. **at counter for safety**  Tandem Walking   Walk with each foot directly in front of other, heel of one foot touching toes of other foot with each step. Both feet straight ahead. **at counter for safety**  Single Leg - Eyes Closed   While standing on left leg, close eyes and maintain balance. Hold__~20__ seconds. Repeat __3__ times per session.  **at counter for safety**  Balance: Eyes Closed - Bilateral (Varied Surfaces)   Stand, feet shoulder width, close eyes. Maintain balance __up to 20_ seconds. Repeat __3-5__ times per set.  **perform in corner for safety**  Balance: Eyes Open - Bilateral (Varied Surfaces)   Stand, feet shoulder width, eyes open. Maintain balance _~30___ seconds. Repeat _3___ times per set.  Repeat on compliant surface: foam. **perform in corner for safety**

## 2018-03-23 NOTE — Therapy (Signed)
[facility] [address] [place], [place], [address] Phone: [phone]   Fax:  [phone]  Speech Language Pathology Treatment  Patient Details  Name: Travis Morris MRN: [id number] Date of Birth: [birth year] Referring Provider (SLP): Wonda Horner, MD   Encounter Date: 03/23/2018  End of Session - 03/23/18 1644    Visit Number  9    Number of Visits  17    Date for SLP Re-Evaluation  05/08/18    SLP Start Time  1407    SLP Stop Time   1448    SLP Time Calculation (min)  41 min       Past Medical History:  Diagnosis Date  . BPH with urinary obstruction   . GERD (gastroesophageal reflux disease)    ESOPHAGEAL REFLUX HX  . History of balanitis   . History of phimosis   . History of repair of rotator cuff   . Hx of biopsy    PENIS CUTANEOUS HX  . Hypercholesteremia   . Hypertension   . Macular hole    AIR FLUID EXCHANGE   . Myasthenia gravis ([place]) 02/2018  . Organic impotence   . Prostate cancer [facility])     Past Surgical History:  Procedure Laterality Date  . APPENDECTOMY    . PARS PLANA VITRECTOMY W/ REPAIR OF MACULAR HOLE     x3  . ROTATOR CUFF REPAIR Right   . UVULOPLASTY     LASER ASSISTED    There were no vitals filed for this visit.  Subjective Assessment - 03/23/18 1444    Subjective  Pt reports double vision is gone, speech is stronger, and swallowing has no choking. Pt still stays away from tough meats, rice, and dry cereal.     Currently in Pain?  No/denies            ADULT SLP TREATMENT - 03/23/18 1446      General Information   Behavior/Cognition  Alert;Cooperative;Pleasant mood;Requires cueing      Treatment Provided   Treatment provided  Cognitive-Linquistic      Dysphagia Treatment   Temperature Spikes Noted  No    Respiratory Status  Room air    Oral Cavity - Dentition  Adequate natural dentition    Treatment Methods  Skilled observation;Therapeutic  exercise;Patient/caregiver education    Patient observed directly with PO's  Yes    Type of PO's observed  Thin liquids;Dysphagia 3 (soft)    Feeding  Able to feed self    Liquids provided via  Cup    Oral Phase Signs & Symptoms  --   none, with 9 liquid boluses   Pharyngeal Phase Signs & Symptoms  --   none, with 7 solid boluses   Type of cueing  Verbal;Tactile    Amount of cueing  Minimal    Other treatment/comments  min verbal and tactile cues were needed rarely (with Mendedsohn) for success. When pt used tactile cues of hand on thyroid, success with Mendelsohn improved dramatically. SLP reviewed handout provided pt on 03-20-18, stressed smaller meals, energy conservation, reduced HEP (Mendelsohn and effortfl swallow) of 10 reps x3/day.  PT without overt s/s aspiration. Followed precautions with independence.       Assessment / Recommendations / Plan   Plan  Continue with current plan of care      Dysphagia Recommendations   Diet recommendations  Dysphagia 3 (mechanical soft);Thin liquid    Liquids provided via  Cup    Medication  Administration  Crushed with puree    Supervision  Patient able to self feed    Compensations  Slow rate;Small sips/bites;Follow solids with liquid    Postural Changes and/or Swallow Maneuvers  Seated upright 90 degrees;Chin tuck      Progression Toward Goals   Progression toward goals  Progressing toward goals       SLP Education - 03/23/18 1643    Education Details  reviewed handout from 03-20-18, Marlowe Sax) Educated  Patient    Methods  Explanation;Demonstration;Other (comment)   tactile cues (pt-generated)   Comprehension  Verbalized understanding       SLP Short Term Goals - 03/23/18 1647      SLP SHORT TERM GOAL #1   Title  pt will follow swallow precautions with rare min A in 2 sessions    Status  Achieved      SLP SHORT TERM GOAL #2   Title  pt will perform swallowing HEP with rare min A in 2 sessions    Status  Achieved       SLP SHORT TERM GOAL #3   Title  pt will provide SLP 3 overt s/s aspiration PNA with modified independence in 3 sessions    Status  Achieved       SLP Long Term Goals - 03/23/18 1647      SLP LONG TERM GOAL #1   Title  pt will complete HEP for dysphagia with modified independence in 3 sessions    Time  4    Period  Weeks   or 17 sessions, for all LTGs   Status  On-going      SLP LONG TERM GOAL #2   Title  pt will follow swallow precautions with POs with modified independence over three sessions    Baseline  03-23-18    Time  4    Period  Weeks    Status  On-going       Plan - 03/23/18 1646    Clinical Impression Statement  Pt followed most recent swallowing recs independently with POs. Pt completing a simplified HEP for now, with min A with Caryl Ada - pt performance improved with digital monitoring of thyroid cartilage. See "other treatment/comments" for further details. Continue skilled ST to maximize safety of swallow. Pt missed visits due to hospitalization, so 2 additional visits were added.    Speech Therapy Frequency  2x / week    Treatment/Interventions  Aspiration precaution training;Pharyngeal strengthening exercises;Diet toleration management by SLP;Group dysphagia treatment;Trials of upgraded texture/liquids;Compensatory strategies;Environmental controls;SLP instruction and feedback    Potential to Achieve Goals  Good       Patient will benefit from skilled therapeutic intervention in order to improve the following deficits and impairments:   Dysphagia, pharyngeal phase    Problem List Patient Active Problem List   Diagnosis Date Noted  . Myasthenia gravis ([place]) 03/06/2018  . Dysphagia 03/06/2018  . Essential tremor 08/11/2015  . Male erectile dysfunction 07/16/2015  . Other abnormal glucose 07/16/2015  . Hyperlipidemia 07/16/2015  . Gastro-esophageal reflux disease without esophagitis 07/16/2015  . Essential (primary) hypertension 07/16/2015  . Malignant  neoplasm of prostate ([place]) 07/16/2015  . Tremor 07/16/2015  . Vertigo 08/24/2014  . History of hypercholesterolemia 08/24/2014  . Sinus bradycardia 08/24/2014  . TIA (transient ischemic attack)   . Prostate cancer ([place])     [place] ,[place], [place]  03/23/2018, 4:48 PM  [facility] [address] [place],  [place], [address] Phone: [phone]   Fax:  [phone]   Name: Travis Morris MRN: [id number] Date of Birth: [birth year]

## 2018-03-23 NOTE — Therapy (Signed)
[facility] [address] [place], [place], [address] Phone: [phone]   Fax:  [phone]  Physical Therapy Evaluation  Patient Details  Name: EGOR FULLILOVE MRN: [id number] Date of Birth: 29-Aug-1940 Referring Provider (PT): London Pepper, MD   Encounter Date: 03/23/2018  PT End of Session - 03/23/18 1454    Visit Number  1    Number of Visits  9    Date for PT Re-Evaluation  04/27/18    Authorization Type  Medicare    PT Start Time  1318    PT Stop Time  1358    PT Time Calculation (min)  40 min    Activity Tolerance  Patient tolerated treatment well    Behavior During Therapy  [facility] for tasks assessed/performed       Past Medical History:  Diagnosis Date  . BPH with urinary obstruction   . GERD (gastroesophageal reflux disease)    ESOPHAGEAL REFLUX HX  . History of balanitis   . History of phimosis   . History of repair of rotator cuff   . Hx of biopsy    PENIS CUTANEOUS HX  . Hypercholesteremia   . Hypertension   . Macular hole    AIR FLUID EXCHANGE   . Myasthenia gravis ([place]) 02/2018  . Organic impotence   . Prostate cancer [facility])     Past Surgical History:  Procedure Laterality Date  . APPENDECTOMY    . PARS PLANA VITRECTOMY W/ REPAIR OF MACULAR HOLE     x3  . ROTATOR CUFF REPAIR Right   . UVULOPLASTY     LASER ASSISTED    There were no vitals filed for this visit.   Subjective Assessment - 03/23/18 1318    Subjective  2 recent hospital admissions due to MG crisis - had difficulty swallowing as well as double vision; currently receiving speech therapy. Reports a history of B neuropathy. Does feel a little "uneasy" at times. Feels like he is a little weaker from hospital admission. Currently not on an exercise program.     Pertinent History  MG, hx of prostate cancer, HTN, RTC repair    Patient Stated Goals  improve balance    Currently in Pain?  No/denies    Pain Score  0-No pain          [facility] PT Assessment - 03/23/18 1327      Assessment   Medical Diagnosis  MG crisis    Referring Provider (PT)  London Pepper, MD    Onset Date/Surgical Date  03/06/18    Next MD Visit  prn    Prior Therapy  not for this issue      Precautions   Precautions  Fall      Restrictions   Weight Bearing Restrictions  No      Balance Screen   Has the patient fallen in the past 6 months  No    Has the patient had a decrease in activity level because of a fear of falling?   No    Is the patient reluctant to leave their home because of a fear of falling?   No      Home Environment   Living Environment  Private residence    Living Arrangements  Spouse/significant other    Home Access  Stairs to enter    Entrance Stairs-Number of Steps  1    [place]  Two level    Alternate Level Stairs-Number of Steps  --  flight   Home Equipment  None      Prior Function   Level of Independence  Independent      Cognition   Overall Cognitive Status  Within Functional Limits for tasks assessed      Sensation   Light Touch  --   history of B neuropathy     Coordination   Gross Motor Movements are Fluid and Coordinated  Yes      Posture/Postural Control   Posture/Postural Control  Postural limitations    Postural Limitations  Rounded Shoulders;Forward head      ROM / Strength   AROM / PROM / Strength  AROM;Strength      AROM   Overall AROM   Within functional limits for tasks performed      Strength   Overall Strength  Within functional limits for tasks performed      Ambulation/Gait   Ambulation/Gait  Yes    Ambulation/Gait Assistance  6: Modified independent (Device/Increase time)    Ambulation/Gait Assistance Details  mild instability throughout    Ambulation Distance (Feet)  100 Feet    Assistive device  None    Gait Pattern  Step-through pattern;Decreased stride length    Ambulation Surface  Level;Indoor      Balance   Balance Assessed  Yes      Standardized  Balance Assessment   Standardized Balance Assessment  Berg Balance Test      Berg Balance Test   Sit to Stand  Able to stand without using hands and stabilize independently    Standing Unsupported  Able to stand safely 2 minutes    Sitting with Back Unsupported but Feet Supported on Floor or Stool  Able to sit safely and securely 2 minutes    Stand to Sit  Sits safely with minimal use of hands    Transfers  Able to transfer safely, minor use of hands    Standing Unsupported with Eyes Closed  Able to stand 10 seconds with supervision    Standing Unsupported with Feet Together  Able to place feet together independently and stand for 1 minute with supervision    From Standing, Reach Forward with Outstretched Arm  Can reach confidently >25 cm (10")    From Standing Position, Pick up Object from [place] to pick up shoe safely and easily    From Standing Position, Turn to Look Behind Over each Shoulder  Needs supervision when turning    Turn 360 Degrees  Able to turn 360 degrees safely but slowly    Standing Unsupported, Alternately Place Feet on Step/Stool  Able to stand independently and complete 8 steps >20 seconds    Standing Unsupported, One Foot in [employer] balance while stepping or standing    Standing on One Leg  Tries to lift leg/unable to hold 3 seconds but remains standing independently    Total Score  41      Functional Gait  Assessment   Gait assessed   Yes    Gait Level Surface  Walks 20 ft in less than 7 sec but greater than 5.5 sec, uses assistive device, slower speed, mild gait deviations, or deviates 6-10 in outside of the 12 in walkway width.    Change in Gait Speed  Able to change speed, demonstrates mild gait deviations, deviates 6-10 in outside of the 12 in walkway width, or no gait deviations, unable to achieve a major change in velocity, or uses a change in velocity, or  uses an assistive device.    Gait with Horizontal Head Turns  Performs head turns smoothly with no  change in gait. Deviates no more than 6 in outside 12 in walkway width    Gait with Vertical Head Turns  Performs head turns with no change in gait. Deviates no more than 6 in outside 12 in walkway width.    Gait and Pivot Turn  Pivot turns safely in greater than 3 sec and stops with no loss of balance, or pivot turns safely within 3 sec and stops with mild imbalance, requires small steps to catch balance.    Step Over Obstacle  Is able to step over 2 stacked shoe boxes taped together (9 in total height) without changing gait speed. No evidence of imbalance.    Gait with Narrow Base of Support  Ambulates less than 4 steps heel to toe or cannot perform without assistance.    Gait with Eyes Closed  Walks 20 ft, uses assistive device, slower speed, mild gait deviations, deviates 6-10 in outside 12 in walkway width. Ambulates 20 ft in less than 9 sec but greater than 7 sec.    Ambulating Backwards  Walks 20 ft, uses assistive device, slower speed, mild gait deviations, deviates 6-10 in outside 12 in walkway width.    Steps  Alternating feet, must use rail.    Total Score  21                Objective measurements completed on examination: See above findings.              PT Education - 03/23/18 1454    Education Details  exam findings, fall risk stratification, POC, goal establishment    Person(s) Educated  Patient    Methods  Explanation;Handout    Comprehension  Verbalized understanding;Returned demonstration          PT Long Term Goals - 03/23/18 1513      PT LONG TERM GOAL #1   Title  patient to be independent with advanced HEP    Time  4    Period  Weeks    Status  New    Target Date  04/27/18      PT LONG TERM GOAL #2   Title  patient to improve Berg to >/= 46/56    Time  4    Status  New    Target Date  04/27/18      PT LONG TERM GOAL #3   Title  patient to improve FGA to >/= 24/30    Time  4    Period  Weeks    Status  New    Target Date  04/27/18       PT LONG TERM GOAL #4   Title  patient to demonstrate gait on uneven surfaces without LOB    Time  4    Period  Weeks    Status  New    Target Date  04/27/18             Plan - 03/23/18 1502    Clinical Impression Statement  Mr. Keidel is a very pleasant 78 y/o male presenting to [facility] today regarding imbalance due to recent MG crisis with 2 hospital admissions. Patient today with general complaints regarding imbalance. Berg and FGA assessed with patient scoring 41/56 and 21/30, respectively. Both placing him into medium-high fall risk. Patient today instructed on initial HEP with focus on safety at home with good understanding. Patient to  benefit from skilled PT intervention to address balance for reduced fall risk and improved community mobility.     Personal Factors and Comorbidities  Age;Comorbidity 3+    Comorbidities  MG, hx of prostate cancer, HTN, RTC repair    Examination-Activity Limitations  Locomotion Level    Examination-Participation Restrictions  Community Activity    Stability/Clinical Decision Making  Stable/Uncomplicated    Clinical Decision Making  Low    Rehab Potential  Good    PT Frequency  2x / week    PT Duration  4 weeks    PT Treatment/Interventions  ADLs/Self Care Home Management;Gait training;Functional mobility training;Therapeutic activities;Balance training;Therapeutic exercise;Neuromuscular re-education;Patient/family education    PT Next Visit Plan  update HEP for balance    Consulted and Agree with Plan of Care  Patient       Patient will benefit from skilled therapeutic intervention in order to improve the following deficits and impairments:  Abnormal gait, Decreased balance, Decreased mobility  Visit Diagnosis: Unsteadiness on feet  Other abnormalities of gait and mobility     Problem List Patient Active Problem List   Diagnosis Date Noted  . Myasthenia gravis ([place]) 03/06/2018  . Dysphagia 03/06/2018  . Essential tremor  08/11/2015  . Male erectile dysfunction 07/16/2015  . Other abnormal glucose 07/16/2015  . Hyperlipidemia 07/16/2015  . Gastro-esophageal reflux disease without esophagitis 07/16/2015  . Essential (primary) hypertension 07/16/2015  . Malignant neoplasm of prostate ([place]) 07/16/2015  . Tremor 07/16/2015  . Vertigo 08/24/2014  . History of hypercholesterolemia 08/24/2014  . Sinus bradycardia 08/24/2014  . TIA (transient ischemic attack)   . Prostate cancer [facility])     Lanney Gins, PT, DPT Supplemental Physical Therapist 03/23/18 3:15 PM Pager: [phone] Office: [facility] [address] [place], [place], [address] Phone: [phone]   Fax:  [phone]  Name: DAIN LASETER MRN: [id number] Date of Birth: 04-23-1940

## 2018-03-26 DIAGNOSIS — H35371 Puckering of macula, right eye: Secondary | ICD-10-CM | POA: Diagnosis not present

## 2018-03-27 ENCOUNTER — Ambulatory Visit: Payer: Medicare Other | Admitting: Speech Pathology

## 2018-03-27 DIAGNOSIS — R1310 Dysphagia, unspecified: Secondary | ICD-10-CM | POA: Diagnosis not present

## 2018-03-27 DIAGNOSIS — G7000 Myasthenia gravis without (acute) exacerbation: Secondary | ICD-10-CM | POA: Diagnosis not present

## 2018-03-27 DIAGNOSIS — I10 Essential (primary) hypertension: Secondary | ICD-10-CM | POA: Diagnosis not present

## 2018-03-27 DIAGNOSIS — Z09 Encounter for follow-up examination after completed treatment for conditions other than malignant neoplasm: Secondary | ICD-10-CM | POA: Diagnosis not present

## 2018-03-27 DIAGNOSIS — Z578 Occupational exposure to other risk factors: Secondary | ICD-10-CM | POA: Diagnosis not present

## 2018-03-27 DIAGNOSIS — R7309 Other abnormal glucose: Secondary | ICD-10-CM | POA: Diagnosis not present

## 2018-04-03 ENCOUNTER — Ambulatory Visit: Payer: Medicare Other | Admitting: Speech Pathology

## 2018-04-04 ENCOUNTER — Other Ambulatory Visit (HOSPITAL_COMMUNITY): Payer: Self-pay | Admitting: *Deleted

## 2018-04-04 ENCOUNTER — Other Ambulatory Visit: Payer: Self-pay

## 2018-04-05 ENCOUNTER — Ambulatory Visit (HOSPITAL_COMMUNITY)
Admission: RE | Admit: 2018-04-05 | Discharge: 2018-04-05 | Disposition: A | Discharge status: Home / Self Care | Payer: Medicare Other | Source: Ambulatory Visit | Attending: Neurology | Admitting: Neurology

## 2018-04-05 ENCOUNTER — Other Ambulatory Visit: Payer: Self-pay

## 2018-04-05 DIAGNOSIS — G7001 Myasthenia gravis with (acute) exacerbation: Secondary | ICD-10-CM | POA: Diagnosis not present

## 2018-04-05 DIAGNOSIS — Z79899 Other long term (current) drug therapy: Secondary | ICD-10-CM | POA: Diagnosis not present

## 2018-04-05 LAB — GLUCOSE, CAPILLARY: Glucose-Capillary: 125 mg/dL — ABNORMAL HIGH (ref 70–99)

## 2018-04-05 MED ORDER — IMMUNE GLOBULIN (HUMAN) 5 GM/50ML IV SOLN
1.0000 g/kg | INTRAVENOUS | Status: DC
  Administered 2018-04-05: 85 g via INTRAVENOUS
  Filled 2018-04-05: qty 50

## 2018-04-05 NOTE — Discharge Instructions (Signed)

## 2018-04-06 ENCOUNTER — Ambulatory Visit: Payer: Medicare Other

## 2018-04-09 DIAGNOSIS — D696 Thrombocytopenia, unspecified: Secondary | ICD-10-CM | POA: Diagnosis not present

## 2018-04-10 ENCOUNTER — Ambulatory Visit (INDEPENDENT_AMBULATORY_CARE_PROVIDER_SITE_OTHER): Payer: Medicare Other | Admitting: Neurology

## 2018-04-10 ENCOUNTER — Ambulatory Visit: Payer: Medicare Other | Admitting: Neurology

## 2018-04-10 ENCOUNTER — Telehealth: Payer: Self-pay | Admitting: Speech Pathology

## 2018-04-10 ENCOUNTER — Encounter: Payer: Self-pay | Admitting: Neurology

## 2018-04-10 ENCOUNTER — Other Ambulatory Visit: Payer: Self-pay

## 2018-04-10 VITALS — BP 140/74 | HR 55 | Temp 97.5°F | Ht 68.0 in

## 2018-04-10 DIAGNOSIS — G7001 Myasthenia gravis with (acute) exacerbation: Secondary | ICD-10-CM | POA: Diagnosis not present

## 2018-04-10 MED ORDER — PREDNISONE 20 MG PO TABS: 50.0000 mg | ORAL_TABLET | Freq: Every day | ORAL | 3 refills | Status: DC

## 2018-04-10 NOTE — Patient Instructions (Signed)
Reduce prednisone to 50mg  daily for two weeks, then go down to 40mg  daily  Continue IVIG   Continue mestinon 30mg  three times daily  Return to clinic on April 9th 8am.

## 2018-04-10 NOTE — Telephone Encounter (Signed)
Travis Morris were contacted today regarding the temporary closing of OP Rehab Services due to Covid-19. Left message on voicemail.  Therapist discussed:    Left message re: interest in further information for an e-visit, virtual check in, or telehealth visit, if those services become available.    [facility] Services will follow up with patients when we are able to resume care.  Travis Morris, [name], [facility] [address] [name], [place]  [address] Phone:  [phone] Fax:  [phone] \

## 2018-04-10 NOTE — Progress Notes (Signed)
Follow-up Visit   Date: 04/10/18   Travis Morris MRN: [id number] DOB: [birth year]   Interim History: Travis Morris is a 78 y.o. right-handed Caucasian male with hypertension, GERD, prostate cancer, and essential tremor (followed by Dr. Carles Collet) returning to the clinic with newly diagnosed seropositive myasthenia gravis.  The patient was accompanied to the clinic by wife who also provides collateral information.    History of present illness: Starting in early December 2019, he began noticing difficulty with swallowing and speaking.  He felt that he would tire quickly and it would take up to an hour for him to eat.  Wife says that he would have coughing and choking spells.  He had lost 25lb.  Later in February, he began having vertical diplopia which prompted him to go to the ER. He denies having droopy eyelids or weakness of the arms and legs.  There was concern for myasthenia gravis by in-patient neurology team and he was started on mestinon 60mg  three times daily, but he developed dizziness and it was reduced to 30mg  three times daily and told to follow-up with out-patient neurology.  Unfortunately, he was readmitted on 2/18 - 2/25 with progressive dysphagia and treated with IVIG x 5 days due to myasthenia crisis. He was also started on prednisone 60mg /d.   Swallowing function was impaired to the point where he needed a NG tube placement for feedings. Upon discharge, his speech had become stronger, but he continued to have double vision and difficulty swallowing.  Since being home, his double vision has resolved and he is coughing/choking much less.  He continues to eat only a soft diet.  He is able to swallow thin liquids and soft foods, but cannot eat hard textured foods.  He is supplementing meals with 1 bottle of boost.    He currently takes prednisone 60mg  daily and mestinon 30mg  at 9am, 2pm, and 6pm.  He denies shortness of breath, neck heaviness, droopy eyelids, or limb weakness.   His speech is back to baseline, however, swallowing is still not quite there.  UPDATE 04/10/2018:  He is here for 3 week follow-up visit.  He was started on maintenance therapy with IVIG in addition remains on prednisone 60mg  daily. Even prior to starting IVIG, he noticed marked improvement in dysphagia and is able to swallow harder textured foods.  He has not had any double vision, dysarthrtia, or limb weakness.  He continues to fatigue and has noticed increased hand tremor, especially when monitoring his glucose.    Medications:  Current Outpatient Medications on File Prior to Visit  Medication Sig Dispense Refill  . aspirin 81 MG tablet Take 81 mg by mouth daily.      Marland Kitchen atorvastatin (LIPITOR) 20 MG tablet Take 20 mg by mouth daily.    . carvedilol (COREG) 12.5 MG tablet Take 1 tablet (12.5 mg total) by mouth 2 (two) times daily with a meal. 60 tablet 0  . losartan-hydrochlorothiazide (HYZAAR) 100-25 MG tablet Take 1 tablet by mouth daily. 90 tablet 1  . Multiple Vitamin (MULTIVITAMINS PO) Take 1 tablet by mouth daily. CENTRUM SILVER     . omeprazole (PRILOSEC) 20 MG capsule Take 20 mg by mouth daily.      Marland Kitchen oxybutynin (DITROPAN) 5 MG tablet Take 5 mg by mouth 3 (three) times daily.    Marland Kitchen pyridostigmine (MESTINON) 60 MG tablet Take 0.5 tablets (30 mg total) by mouth 3 (three) times daily. 140 tablet 3  . sulfamethoxazole-trimethoprim (BACTRIM DS,SEPTRA DS)  800-160 MG tablet Take 1 tablet by mouth every Monday, Wednesday, and Friday. 30 tablet 2  . vitamin E 400 UNIT capsule Take 400 Units by mouth daily.      . metFORMIN (GLUCOPHAGE) 500 MG tablet Take 1 tablet (500 mg total) by mouth 2 (two) times daily with a meal for 21 days. 42 tablet 0   No current facility-administered medications on file prior to visit.     Allergies: No Known Allergies  Review of Systems:  CONSTITUTIONAL: No fevers, chills, night sweats, or weight loss.  EYES: No visual changes or eye pain ENT: No hearing changes.   No history of nose bleeds.   RESPIRATORY: No cough, wheezing and shortness of breath.   CARDIOVASCULAR: Negative for chest pain, and palpitations.   GI: Negative for abdominal discomfort, blood in stools or black stools.  No recent change in bowel habits.   GU:  No history of incontinence.   MUSCLOSKELETAL: No history of joint pain or swelling.  No myalgias.   SKIN: Negative for lesions, rash, and itching.   ENDOCRINE: Negative for cold or heat intolerance, polydipsia or goiter.   PSYCH:  No depression or anxiety symptoms.   NEURO: As Above.   Vital Signs:  BP 140/74   Pulse (!) 55   Temp (!) 97.5 F (36.4 C) (Oral)   Ht 5\' 8"  (1.727 m)   SpO2 96%   BMI 27.37 kg/m    General Medical Exam:   General:  Well appearing, comfortable  Eyes/ENT: see cranial nerve examination.   Neck:  No carotid bruits. Respiratory:  Clear to auscultation, good air entry bilaterally.   Cardiac:  Regular rate and rhythm, no murmur.   Ext:  No edema   Neurological Exam: MENTAL STATUS including orientation to time, place, person, recent and remote memory, attention span and concentration, language, and fund of knowledge is normal.  Speech is not dysarthric.  CRANIAL NERVES:  No visual field defects.  Pupils equal round and reactive to light.  Normal conjugate, extra-ocular eye movements in all directions of gaze.  No ptosis at rest or with sustained upgaze.  Face is symmetric. Facial muscles are 5/5 including frontalis, orbicularis oculi, orbicularis oris, and buccinator is 5/5.  Palate elevates symmetrically.  Tongue is midline and 5/5, side-to-side movements are normal.  MOTOR:  Motor strength is 5/5 in all extremities, without fatiguability.  Bilateral intentional tremor of the hands.  No atrophy.  No pronator drift.  Tone is normal.    COORDINATION/GAIT:  Normal finger-to- nose-finger.  He is able to stand up without using arms to push off.  Gait narrow based and stable.   Data: MRI brain 02/28/2018:  1. No acute intracranial abnormality. 2. Mild chronic small vessel ischemic disease  CT chest with contrast 03/06/2018:  Moderate circumferential wall thickening is seen involving the superior portion of the thoracic esophagus; it is uncertain if this is inflammatory or neoplastic in origin. Endoscopy is recommended for further evaluation. Aortic Atherosclerosis  Labs 02/28/2018:  AChR blocking 60*, modulating 63*, binding <0.03  Lab Results  Component Value Date   HGBA1C 6.3 (H) 02/28/2018   IMPRESSION/PLAN: Seropositive myasthenia gravis without exacerbation manifesting with bulbar weakness (02/2018).  He underwent treatment with IVIG x 5 days and started on prednisone 60mg /d and maintenance IVIG.  He has responded very well and does not have any signs of exacerbation. I will start to taper prednisone to 50mg  daily x 2 weeks, then 40mg  daily. Continue IVIG every 21 days  Continue mestinon 30mg  three times daily Continue calcium supplementation Continue bactrim DS MWF Continue prilosec 20mg  daily  Return to clinic in 3 weeks  Greater than 50% of this 30 minute visit was spent in counseling, explanation of diagnosis, planning of further management, and coordination of care.   Thank you for allowing me to participate in patient's care.  If I can answer any additional questions, I would be pleased to do so.    Sincerely,    Mirl Hillery K. Posey Pronto, DO

## 2018-04-13 ENCOUNTER — Ambulatory Visit: Payer: Medicare Other | Admitting: Physical Therapy

## 2018-04-17 ENCOUNTER — Ambulatory Visit: Payer: Medicare Other | Admitting: Rehabilitation

## 2018-04-18 ENCOUNTER — Telehealth: Payer: Self-pay | Admitting: Hematology

## 2018-04-18 NOTE — Telephone Encounter (Signed)
Received a new hem referral from Dr. Orland Mustard for thrombocytopenia. Pt cld to schedule an appt. Mr. Travis Morris has been scheduled to see Dr. Irene Limbo on 4/7 at [facility] to arrive 15 minutes early.

## 2018-04-19 ENCOUNTER — Telehealth: Payer: Self-pay | Admitting: *Deleted

## 2018-04-19 NOTE — Telephone Encounter (Signed)
Sandy from Dr. Darien Ramus office called asking if patient needs to stop his Bactrim since his platelets are low.  Called her back and left message that he can stop the Bactrim per Dr. Posey Pronto.  FYI: patient is being referred to hematology.

## 2018-04-20 ENCOUNTER — Telehealth: Payer: Self-pay | Admitting: Physical Therapy

## 2018-04-20 NOTE — Telephone Encounter (Signed)
Travis Morris was contacted 04-19-18 regarding temporary reduction of Outpatient Neuro Rehabilitation Services due to concerns for community transmission of COVID-19.  Patient identity was verified.  Assessed if patient needed to be seen in person by clinician (recent fall or acute injury that requires hands on assessment and advice, change in diet order, post-surgical, special cases, etc.).    Patient did not have an acute/special need that requires in person visit. Proceeded with phone call.  Therapist advised the patient to continue to perform his/her HEP and assured he/she had no unanswered questions or concerns at this time.  The patient expressed interest in being contacted for an E-Visit, virtual check in, or Telehealth visit to continue their plan of care, when those services become available.  Outpatient [facility] Services will follow up with patient at that time.  Patient is aware we can be reached by telephone during limited business hours in the meantime.    Guido Sander, PT [facility] [address] [place], [place] [address] [phone]

## 2018-04-24 ENCOUNTER — Other Ambulatory Visit: Payer: Self-pay

## 2018-04-24 ENCOUNTER — Encounter

## 2018-04-24 ENCOUNTER — Ambulatory Visit: Payer: Medicare Other | Admitting: Rehabilitation

## 2018-04-24 ENCOUNTER — Inpatient Hospital Stay: Payer: Medicare Other | Attending: Hematology | Admitting: Hematology

## 2018-04-24 ENCOUNTER — Inpatient Hospital Stay: Payer: Medicare Other

## 2018-04-24 ENCOUNTER — Ambulatory Visit: Payer: Medicare Other | Admitting: Neurology

## 2018-04-24 VITALS — BP 150/64 | HR 63 | Temp 97.5°F | Resp 18 | Ht 68.0 in | Wt 184.8 lb

## 2018-04-24 DIAGNOSIS — Z7984 Long term (current) use of oral hypoglycemic drugs: Secondary | ICD-10-CM | POA: Insufficient documentation

## 2018-04-24 DIAGNOSIS — I10 Essential (primary) hypertension: Secondary | ICD-10-CM | POA: Diagnosis not present

## 2018-04-24 DIAGNOSIS — Z800 Family history of malignant neoplasm of digestive organs: Secondary | ICD-10-CM

## 2018-04-24 DIAGNOSIS — E119 Type 2 diabetes mellitus without complications: Secondary | ICD-10-CM | POA: Diagnosis not present

## 2018-04-24 DIAGNOSIS — K219 Gastro-esophageal reflux disease without esophagitis: Secondary | ICD-10-CM | POA: Insufficient documentation

## 2018-04-24 DIAGNOSIS — Z7982 Long term (current) use of aspirin: Secondary | ICD-10-CM | POA: Insufficient documentation

## 2018-04-24 DIAGNOSIS — D696 Thrombocytopenia, unspecified: Secondary | ICD-10-CM

## 2018-04-24 DIAGNOSIS — E785 Hyperlipidemia, unspecified: Secondary | ICD-10-CM | POA: Diagnosis not present

## 2018-04-24 LAB — CBC WITH DIFFERENTIAL/PLATELET
Abs Immature Granulocytes: 0.05 10*3/uL (ref 0.00–0.07)
Basophils Absolute: 0 10*3/uL (ref 0.0–0.1)
Basophils Relative: 0 %
Eosinophils Absolute: 0.1 10*3/uL (ref 0.0–0.5)
Eosinophils Relative: 1 %
HCT: 41.8 % (ref 39.0–52.0)
Hemoglobin: 14.3 g/dL (ref 13.0–17.0)
Immature Granulocytes: 1 %
Lymphocytes Relative: 13 %
Lymphs Abs: 1.4 10*3/uL (ref 0.7–4.0)
MCH: 32.2 pg (ref 26.0–34.0)
MCHC: 34.2 g/dL (ref 30.0–36.0)
MCV: 94.1 fL (ref 80.0–100.0)
Monocytes Absolute: 0.5 10*3/uL (ref 0.1–1.0)
Monocytes Relative: 5 %
Neutro Abs: 8.6 10*3/uL — ABNORMAL HIGH (ref 1.7–7.7)
Neutrophils Relative %: 80 %
Platelets: 144 10*3/uL — ABNORMAL LOW (ref 150–400)
RBC: 4.44 MIL/uL (ref 4.22–5.81)
RDW: 12.3 % (ref 11.5–15.5)
WBC: 10.6 10*3/uL — ABNORMAL HIGH (ref 4.0–10.5)
nRBC: 0 % (ref 0.0–0.2)

## 2018-04-24 LAB — CMP (CANCER CENTER ONLY)
ALT: 35 U/L (ref 0–44)
AST: 34 U/L (ref 15–41)
Albumin: 3.3 g/dL — ABNORMAL LOW (ref 3.5–5.0)
Alkaline Phosphatase: 57 U/L (ref 38–126)
Anion gap: 8 (ref 5–15)
BUN: 26 mg/dL — ABNORMAL HIGH (ref 8–23)
CO2: 28 mmol/L (ref 22–32)
Calcium: 9.2 mg/dL (ref 8.9–10.3)
Chloride: 102 mmol/L (ref 98–111)
Creatinine: 1.04 mg/dL (ref 0.61–1.24)
GFR, Est AFR Am: >60 mL/min (ref >60)
GFR, Estimated: >60 mL/min (ref >60)
Glucose, Bld: 104 mg/dL — ABNORMAL HIGH (ref 70–99)
Potassium: 3.6 mmol/L (ref 3.5–5.1)
Sodium: 138 mmol/L (ref 135–145)
Total Bilirubin: 0.8 mg/dL (ref 0.3–1.2)
Total Protein: 7 g/dL (ref 6.5–8.1)

## 2018-04-24 LAB — IMMATURE PLATELET FRACTION: Immature Platelet Fraction: 3.9 % (ref 1.2–8.6)

## 2018-04-24 LAB — PLATELET BY CITRATE

## 2018-04-24 LAB — VITAMIN B12: Vitamin B-12: 404 pg/mL (ref 180–914)

## 2018-04-24 LAB — LACTATE DEHYDROGENASE: LDH: 213 U/L — ABNORMAL HIGH (ref 98–192)

## 2018-04-24 NOTE — Progress Notes (Signed)
HEMATOLOGY/ONCOLOGY CONSULTATION NOTE  Date of Service: 04/24/2018  Patient Care Team: London Pepper, MD as PCP - General (Family Medicine)  CHIEF COMPLAINTS/PURPOSE OF CONSULTATION:  Thrombocytopenia  HISTORY OF PRESENTING ILLNESS:   Travis Morris is a wonderful 78 y.o. male who has been referred to [place] by London Pepper, MD  for evaluation and management of Thrombocytopenia. The pt reports that he is doing well overall.   The pt was recently diagnosed with Myasthenia gravis, after an admission in mid February 2020 for dysphagia, diplopia, and dysarthria. He began his first course of IVIG in the hospital, received a second course 3 weeks later, and is anticipating his third round later this week. He also bean Mestinon in February after being diagnosed with Myasthenia gravis. His neurologist is Dr. Narda Amber.  The pt also reports that he began prophylactic Bactrim and steroids MWF with his PCP Dr. Orland Mustard in late February/ early March. The pt is now on 50mg  Prednisone. He notes that he stopped Bactrim on 04/10/18 with Dr. Posey Pronto.  The pt reports that he has neuropathy in his feet and began medication for DM when he was in the hospital in February. The pt notes that his urinary urgency has improved recently. He notes that he has been checking his blood sugars every day since leaving the hospital. He notes that he has not checked his fasting glucose in the mornings.  The pt notes that he has completed speech therapy to aid his speaking and dysphagia, which the pt reports have been effective. He notes that he is now eating very well and is swallowing much better. He lost 25 pounds due to his previous difficulty swallowing, but notes that he is beginning to gain some of this weight back. The pt notes that he does "tire out" when cutting his grass, but notes that his muscle strength has returned to his baseline.  The pt denies any recent concerns for infections in the last 2 months. The pt  denies any concerns for bleeding or abnormal bruising.  Most recent lab results (04/09/18) of CBC w/diff is as follows: all values are WNL except for RBC at 4.16, MCV at 95.2, PLT at 91k, Lymphs abs at 1.0k.  On review of systems, pt reports eating well, improving swallowing, neuropathy in legs, and denies bleeding, abnormal bruising, fevers, chills, night sweats, recent concerns for infections, testicular pain or swelling, and any other symptoms.   On PMHx the pt reports Myasthenia gravis, prostate cancer, essential tremors.  MEDICAL HISTORY:  Past Medical History:  Diagnosis Date  . BPH with urinary obstruction   . GERD (gastroesophageal reflux disease)    ESOPHAGEAL REFLUX HX  . History of balanitis   . History of phimosis   . History of repair of rotator cuff   . Hx of biopsy    PENIS CUTANEOUS HX  . Hypercholesteremia   . Hypertension   . Macular hole    AIR FLUID EXCHANGE   . Myasthenia gravis ([place]) 02/2018  . Organic impotence   . Prostate cancer [facility])     SURGICAL HISTORY: Past Surgical History:  Procedure Laterality Date  . APPENDECTOMY    . PARS PLANA VITRECTOMY W/ REPAIR OF MACULAR HOLE     x3  . ROTATOR CUFF REPAIR Right   . UVULOPLASTY     LASER ASSISTED    SOCIAL HISTORY: Social History   Socioeconomic History  . Marital status: Married    Spouse name: Not on file  .  Number of children: 2  . Years of education: Not on file  . Highest education level: Not on file  Occupational History    Comment: accountant, semi-retired  Social Needs  . Financial resource strain: Not on file  . Food insecurity:    Worry: Not on file    Inability: Not on file  . Transportation needs:    Medical: Not on file    Non-medical: Not on file  Tobacco Use  . Smoking status: Never Smoker  . Smokeless tobacco: Never Used  Substance and Sexual Activity  . Alcohol use: Yes    Alcohol/week: 0.0 standard drinks    Comment: 2 oz gin weekly  . Drug use: No  . Sexual  activity: Not on file  Lifestyle  . Physical activity:    Days per week: Not on file    Minutes per session: Not on file  . Stress: Not on file  Relationships  . Social connections:    Talks on phone: Not on file    Gets together: Not on file    Attends religious service: Not on file    Active member of club or organization: Not on file    Attends meetings of clubs or organizations: Not on file    Relationship status: Not on file  . Intimate partner violence:    Fear of current or ex partner: Not on file    Emotionally abused: Not on file    Physically abused: Not on file    Forced sexual activity: Not on file  Other Topics Concern  . Not on file  Social History Narrative  . Not on file    FAMILY HISTORY: Family History  Problem Relation Age of Onset  . Dementia Father   . Colon cancer Brother        mets to liver   . Liver cancer Brother   . Heart attack Brother        MI at age 40  . Healthy Son   . Healthy Daughter     ALLERGIES:  has No Known Allergies.  MEDICATIONS:  Current Outpatient Medications  Medication Sig Dispense Refill  . aspirin 81 MG tablet Take 81 mg by mouth daily.      Marland Kitchen atorvastatin (LIPITOR) 20 MG tablet Take 20 mg by mouth daily.    . carvedilol (COREG) 12.5 MG tablet Take 1 tablet (12.5 mg total) by mouth 2 (two) times daily with a meal. 60 tablet 0  . losartan-hydrochlorothiazide (HYZAAR) 100-25 MG tablet Take 1 tablet by mouth daily. 90 tablet 1  . metFORMIN (GLUCOPHAGE) 500 MG tablet Take 1 tablet (500 mg total) by mouth 2 (two) times daily with a meal for 21 days. 42 tablet 0  . Multiple Vitamin (MULTIVITAMINS PO) Take 1 tablet by mouth daily. CENTRUM SILVER     . omeprazole (PRILOSEC) 20 MG capsule Take 20 mg by mouth daily.      Marland Kitchen oxybutynin (DITROPAN) 5 MG tablet Take 5 mg by mouth 3 (three) times daily.    . predniSONE (DELTASONE) 20 MG tablet Take 2.5 tablets (50 mg total) by mouth daily with breakfast. 90 tablet 3  . pyridostigmine  (MESTINON) 60 MG tablet Take 0.5 tablets (30 mg total) by mouth 3 (three) times daily. 140 tablet 3  . sulfamethoxazole-trimethoprim (BACTRIM DS,SEPTRA DS) 800-160 MG tablet Take 1 tablet by mouth every Monday, Wednesday, and Friday. 30 tablet 2  . vitamin E 400 UNIT capsule Take 400 Units by mouth daily.  No current facility-administered medications for this visit.     REVIEW OF SYSTEMS:    10 Point review of Systems was done is negative except as noted above.  PHYSICAL EXAMINATION:  . Vitals:   04/24/18 0959  BP: (!) 150/64  Pulse: 63  Resp: 18  Temp: (!) 97.5 F (36.4 C)  SpO2: 96%   Filed Weights   04/24/18 0959  Weight: 184 lb 12.8 oz (83.8 kg)   .Body mass index is 28.1 kg/m.  GENERAL:alert, in no acute distress and comfortable SKIN: no acute rashes, no significant lesions EYES: conjunctiva are pink and non-injected, sclera anicteric OROPHARYNX: MMM, no exudates, no oropharyngeal erythema or ulceration NECK: supple, no JVD LYMPH:  no palpable lymphadenopathy in the cervical, axillary or inguinal regions LUNGS: clear to auscultation b/l with normal respiratory effort HEART: regular rate & rhythm ABDOMEN:  normoactive bowel sounds , non tender, not distended. Extremity: no pedal edema PSYCH: alert & oriented x 3 with fluent speech NEURO: no focal motor/sensory deficits  LABORATORY DATA:  I have reviewed the data as listed  . CBC Latest Ref Rng & Units 04/24/2018 04/24/2018 03/08/2018  WBC 4.0 - 10.5 K/uL 10.6(H) - 7.1  Hemoglobin 13.0 - 17.0 g/dL 14.3 - 14.0  Hematocrit 37.5 - 51.0 % 41.8 43.0 40.5  Platelets 150 - 400 K/uL 144(L) - 190    . CMP Latest Ref Rng & Units 04/24/2018 03/13/2018 03/08/2018  Glucose 70 - 99 mg/dL 104(H) - 110(H)  BUN 8 - 23 mg/dL 26(H) - 20  Creatinine 0.61 - 1.24 mg/dL 1.04 1.06 0.99  Sodium 135 - 145 mmol/L 138 - 138  Potassium 3.5 - 5.1 mmol/L 3.6 - 3.6  Chloride 98 - 111 mmol/L 102 - 106  CO2 22 - 32 mmol/L 28 - 23  Calcium  8.9 - 10.3 mg/dL 9.2 - 8.5(L)  Total Protein 6.5 - 8.1 g/dL 7.0 - -  Total Bilirubin 0.3 - 1.2 mg/dL 0.8 - -  Alkaline Phos 38 - 126 U/L 57 - -  AST 15 - 41 U/L 34 - -  ALT 0 - 44 U/L 35 - -        RADIOGRAPHIC STUDIES: I have personally reviewed the radiological images as listed and agreed with the findings in the report. No results found.  ASSESSMENT & PLAN:  78 y.o. male with  1. Thrombocytopenia - likely related to Bactrim and possibly IVIG PLAN -Discussed patient's most recent labs from 04/09/18, PLT at 91k. WBC at 7.0k, HGB normal at 13.6. -Previous labs: 03/27/18 PLT at 133k. 03/08/18 PLT normal at 190k. 01/15/18 PLT at 205k -Reviewed the 03/06/18 CT Chest, which did not reveal concerns of a thymoma. Also reviewed the 02/27/18 endoscopy and the associated pathology which was benign. -Suspect that patient's PLT were decreasing due to Bactrim, which he began taking in late February/early March, which corresponds with his new onset of thrombocytopenia.  -Pt stopped Bactrim on 04/10/18 and has not had repeat labs since stopping Bactrim -labs done today show platelets are back up to near normal at 144k -Discussed that the pt's new thrombocytopenia could also be related to IVIG treatments, though felt to be less likely, and if so, recommendation would be to watch PLT closely as his IVIG treatments are of therapeutic importance -If pt has an element of ITP, treatment would include Prednisone and IVIG which the pt is currently receiving -Discussed that Omeprazole can also cause thrombocytopenia, though his chronic use of this is not suspected to cause  the new onset of his thrombocytopenia -Recommend checking morning fasting glucose levels and continuing DM management and evaluation with PCP Dr. Orland Mustard -Will order labs today -Will have a telephone visit with the pt again in 1 week  2.  Patient Active Problem List   Diagnosis Date Noted  . Myasthenia gravis ([place]) 03/06/2018  . Dysphagia  03/06/2018  . Essential tremor 08/11/2015  . Male erectile dysfunction 07/16/2015  . Other abnormal glucose 07/16/2015  . Hyperlipidemia 07/16/2015  . Gastro-esophageal reflux disease without esophagitis 07/16/2015  . Essential (primary) hypertension 07/16/2015  . Malignant neoplasm of prostate ([place]) 07/16/2015  . Tremor 07/16/2015  . Vertigo 08/24/2014  . History of hypercholesterolemia 08/24/2014  . Sinus bradycardia 08/24/2014  . TIA (transient ischemic attack)   . Prostate cancer [facility])   -following with PCP for this  Labs today Telephone visit with Dr Irene Limbo in 1 week   All of the patients questions were answered with apparent satisfaction. The patient knows to call the clinic with any problems, questions or concerns.  The total time spent in the appt was 45 minutes and more than 50% was on counseling and direct patient cares.    Sullivan Lone MD MS AAHIVMS [facility] Hematology/Oncology Physician [facility]  (Office):       [phone] (Work cell):  [phone] (Fax):           [phone]  04/24/2018 10:39 AM  I, Baldwin Jamaica, am acting as a scribe for Dr. Sullivan Lone.   .I have reviewed the above documentation for accuracy and completeness, and I agree with the above. Brunetta Genera MD

## 2018-04-25 ENCOUNTER — Other Ambulatory Visit (HOSPITAL_COMMUNITY): Payer: Self-pay | Admitting: *Deleted

## 2018-04-25 LAB — FOLATE RBC
Folate, Hemolysate: 460 ng/mL
Folate, RBC: 1070 ng/mL (ref >498)
Hematocrit: 43 % (ref 37.5–51.0)

## 2018-04-26 ENCOUNTER — Ambulatory Visit: Payer: Medicare Other | Admitting: Neurology

## 2018-04-26 ENCOUNTER — Telehealth: Payer: Medicare Other | Admitting: Neurology

## 2018-04-26 ENCOUNTER — Other Ambulatory Visit: Payer: Self-pay

## 2018-04-26 ENCOUNTER — Encounter (HOSPITAL_COMMUNITY)
Admission: RE | Admit: 2018-04-26 | Discharge: 2018-04-26 | Disposition: A | Discharge status: Home / Self Care | Payer: Medicare Other | Source: Ambulatory Visit | Attending: Neurology | Admitting: Neurology

## 2018-04-26 ENCOUNTER — Encounter (HOSPITAL_COMMUNITY): Payer: Medicare Other

## 2018-04-26 DIAGNOSIS — G7001 Myasthenia gravis with (acute) exacerbation: Secondary | ICD-10-CM | POA: Insufficient documentation

## 2018-04-26 MED ORDER — IMMUNE GLOBULIN (HUMAN) 5 GM/50ML IV SOLN
1.0000 g/kg | INTRAVENOUS | Status: DC
  Administered 2018-04-26: 10:00:00 85 g via INTRAVENOUS
  Filled 2018-04-26 (×2): qty 50

## 2018-05-01 ENCOUNTER — Encounter: Payer: Medicare Other | Admitting: Speech Pathology

## 2018-05-02 DIAGNOSIS — G7000 Myasthenia gravis without (acute) exacerbation: Secondary | ICD-10-CM | POA: Diagnosis not present

## 2018-05-02 DIAGNOSIS — E1140 Type 2 diabetes mellitus with diabetic neuropathy, unspecified: Secondary | ICD-10-CM | POA: Diagnosis not present

## 2018-05-02 DIAGNOSIS — D696 Thrombocytopenia, unspecified: Secondary | ICD-10-CM | POA: Diagnosis not present

## 2018-05-03 ENCOUNTER — Ambulatory Visit: Payer: Medicare Other | Admitting: Physical Therapy

## 2018-05-03 NOTE — Progress Notes (Signed)
   Virtual Visit via Telephone Note The purpose of this virtual visit is to provide medical care while limiting exposure to the novel coronavirus.    Consent was obtained for phone visit:  Yes.   Answered questions that patient had about telehealth interaction:  Yes.   I discussed the limitations, risks, security and privacy concerns of performing an evaluation and management service by telemedicine. I also discussed with the patient that there may be a patient responsible charge related to this service. The patient expressed understanding and agreed to proceed.  Pt location: Home Physician Location: office Name of referring provider:  London Pepper, MD I connected with Aretha Parrot at patients initiation/request on 05/04/2018 at 10:30 AM EDT by video enabled telemedicine application and verified that I am speaking with the correct person using two identifiers. Pt MRN:  [id number] Pt DOB:  Oct 12, 1940 Video Participants:  Aretha Parrot; wife   History of Present Illness: This is a 78 y.o. male returning for follow-up of myasthenia gravis.  He is doing well and denies any new weakness, double vision, droopy eyelids, difficulty swallowing or talking.  He was instructed to reduce prednisone to 40 mg on April 8, however forgot to do this until the 15th.    Observations/Objective:   Patient is awake, alert, and appears comfortable.  Oriented x 4. Speech is not dysarthric.    IMPRESSION/PLAN: Seropositive myasthenia gravis without exacerbation (02/2018)  - Continue prednisone 40mg  until 4/29, then reduce prednisone to 30mg    - Continue mestinon 30mg  three times daily  - Discontinue IVIG as he does not show signs of weakness  Chronic steroid use  - Continue calium, bactrim MWF  - Continue prilosec 20mg  daily   Follow Up Instructions:  I discussed the assessment and treatment plan with the patient. The patient was provided an opportunity to ask questions and all were answered. The  patient agreed with the plan and demonstrated an understanding of the instructions.   The patient was advised to call back or seek an in-person evaluation if the symptoms worsen or if the condition fails to improve as anticipated.  Return to clinic in 6 weeks  Total Time spent:  15 minutes   Alda Berthold, DO

## 2018-05-04 ENCOUNTER — Encounter: Payer: Self-pay | Admitting: *Deleted

## 2018-05-04 ENCOUNTER — Telehealth (INDEPENDENT_AMBULATORY_CARE_PROVIDER_SITE_OTHER): Payer: Medicare Other | Admitting: Neurology

## 2018-05-04 ENCOUNTER — Other Ambulatory Visit: Payer: Self-pay

## 2018-05-04 DIAGNOSIS — G7001 Myasthenia gravis with (acute) exacerbation: Secondary | ICD-10-CM

## 2018-05-04 DIAGNOSIS — Z7952 Long term (current) use of systemic steroids: Secondary | ICD-10-CM | POA: Diagnosis not present

## 2018-05-04 MED ORDER — PREDNISONE 10 MG PO TABS: ORAL_TABLET | ORAL | 3 refills | Status: DC

## 2018-05-04 NOTE — Progress Notes (Signed)
I will call to schedule.

## 2018-05-07 ENCOUNTER — Telehealth: Payer: Self-pay | Admitting: *Deleted

## 2018-05-07 NOTE — Telephone Encounter (Signed)
Cancelled patient's upcoming IVIG per Dr. Posey Pronto.

## 2018-05-08 ENCOUNTER — Ambulatory Visit: Payer: Medicare Other | Admitting: Physical Therapy

## 2018-05-08 ENCOUNTER — Encounter: Payer: Self-pay | Admitting: Speech Pathology

## 2018-05-08 ENCOUNTER — Ambulatory Visit: Payer: Medicare Other

## 2018-05-14 DIAGNOSIS — C61 Malignant neoplasm of prostate: Secondary | ICD-10-CM | POA: Diagnosis not present

## 2018-05-15 ENCOUNTER — Encounter: Payer: Medicare Other | Admitting: Speech Pathology

## 2018-05-17 ENCOUNTER — Encounter (HOSPITAL_COMMUNITY): Payer: Medicare Other

## 2018-05-18 ENCOUNTER — Ambulatory Visit: Payer: Medicare Other | Admitting: Physical Therapy

## 2018-05-21 ENCOUNTER — Ambulatory Visit: Payer: Medicare Other | Admitting: Neurology

## 2018-05-21 DIAGNOSIS — R972 Elevated prostate specific antigen [PSA]: Secondary | ICD-10-CM | POA: Diagnosis not present

## 2018-05-21 DIAGNOSIS — N401 Enlarged prostate with lower urinary tract symptoms: Secondary | ICD-10-CM | POA: Diagnosis not present

## 2018-05-21 DIAGNOSIS — C61 Malignant neoplasm of prostate: Secondary | ICD-10-CM | POA: Diagnosis not present

## 2018-05-21 DIAGNOSIS — R3915 Urgency of urination: Secondary | ICD-10-CM | POA: Diagnosis not present

## 2018-05-25 ENCOUNTER — Ambulatory Visit: Payer: Medicare Other | Admitting: Physical Therapy

## 2018-05-29 ENCOUNTER — Encounter: Payer: Medicare Other | Admitting: Speech Pathology

## 2018-06-01 ENCOUNTER — Telehealth (INDEPENDENT_AMBULATORY_CARE_PROVIDER_SITE_OTHER): Payer: Medicare Other | Admitting: Neurology

## 2018-06-01 ENCOUNTER — Encounter: Payer: Self-pay | Admitting: Neurology

## 2018-06-01 ENCOUNTER — Ambulatory Visit: Payer: Medicare Other | Admitting: Physical Therapy

## 2018-06-01 ENCOUNTER — Other Ambulatory Visit: Payer: Self-pay

## 2018-06-01 VITALS — BP 157/68 | Ht 68.0 in | Wt 180.0 lb

## 2018-06-01 DIAGNOSIS — Z7952 Long term (current) use of systemic steroids: Secondary | ICD-10-CM

## 2018-06-01 DIAGNOSIS — G7000 Myasthenia gravis without (acute) exacerbation: Secondary | ICD-10-CM

## 2018-06-01 MED ORDER — PREDNISONE 10 MG PO TABS: ORAL_TABLET | ORAL | 3 refills | Status: DC

## 2018-06-01 NOTE — Progress Notes (Signed)
    Virtual Visit via Telephone Note The purpose of this virtual visit is to provide medical care while limiting exposure to the novel coronavirus.    Consent was obtained for phone visit:  Yes.   Answered questions that patient had about telehealth interaction:  Yes.   I discussed the limitations, risks, security and privacy concerns of performing an evaluation and management service by telephone. I also discussed with the patient that there may be a patient responsible charge related to this service. The patient expressed understanding and agreed to proceed.  Pt location: Home Physician Location: office Name of referring provider:  London Pepper, MD I connected with .Travis Morris at patients initiation/request on 06/01/2018 at 10:30 AM EDT by telephone and verified that I am speaking with the correct person using two identifiers.  Pt MRN:  [id number] Pt DOB:  1940/12/08   History of Present Illness: This is a 78 year old man returning for 1 month follow-up of myasthenia gravis.  He was recommended to taper his prednisone and is currently on 30 mg daily.  He has been on this dose since 5/6 and denies any breakthrough weakness.  He denies ptosis, double vision, difficulty swallowing/talking, or limb weakness.  He tells me that he and his wife are planning to move to [place] should the borders open sometime this summer.  They will initially be staying with their daughter until they find their own home.  He does not have a primary care doctor or neurologist situated in [place] at this time.    Observations/Objective:    He is awake and oriented.  Speech is not dysarthric and sounds strong.   Assessment and Plan:   1.  Seropositive myasthenia gravis without exacerbation, diagnosed February 2020.  -Clinically doing well on prednisone taper.   -Continue prednisone 30 mg for 1 more week then reduce to 25 mg daily on 5/21.  Patient counseled on MG symptoms and notified to contact me, should he  develop any  -Continue Mestinon 30 mg 3 times daily  -Patient was urged to establish a neurologist in [place] as he will need ongoing close clinical follow-up for myasthenia medication management  2.  Chronic steroid use  -Continue calcium and vitamin D supplements  -Continue Prilosec 20 mg daily  -Continue Bactrim DS MWF, once his prednisone is less than 20 mg this can be discontinued  Follow Up Instructions: I discussed the assessment and treatment plan with the patient. The patient was provided an opportunity to ask questions and all were answered. The patient agreed with the plan and demonstrated an understanding of the instructions.   The patient was advised to call back or seek an in-person evaluation if the symptoms worsen or if the condition fails to improve as anticipated.   Total Time spent in visit with the patient was:  15 min, of which 100% of the time was spent in counseling and/or coordinating care.   Pt understands and agrees with the plan of care outlined.    Return to clinic in 1 month   Alda Berthold, DO

## 2018-06-07 ENCOUNTER — Telehealth: Payer: Self-pay | Admitting: Neurology

## 2018-06-07 NOTE — Telephone Encounter (Signed)
Patient called to update some information for Dr. Posey Pronto and to see if she can get a referral sent to [place] for him to have a Neurologist there. The  Address is [address], [place]  Email: [email or web address]  Thank you

## 2018-06-07 NOTE — Telephone Encounter (Signed)
Physicians name is Heidi Dach. Patient also wanted to let Dr. Posey Pronto know that he will not be moving until 08/01/18, so he will be here for his 07/05/18 appointment. Thanks

## 2018-06-21 DIAGNOSIS — D696 Thrombocytopenia, unspecified: Secondary | ICD-10-CM | POA: Diagnosis not present

## 2018-06-21 DIAGNOSIS — Z0000 Encounter for general adult medical examination without abnormal findings: Secondary | ICD-10-CM | POA: Diagnosis not present

## 2018-06-21 DIAGNOSIS — G7000 Myasthenia gravis without (acute) exacerbation: Secondary | ICD-10-CM | POA: Diagnosis not present

## 2018-06-21 DIAGNOSIS — E785 Hyperlipidemia, unspecified: Secondary | ICD-10-CM | POA: Diagnosis not present

## 2018-06-21 DIAGNOSIS — E1165 Type 2 diabetes mellitus with hyperglycemia: Secondary | ICD-10-CM | POA: Diagnosis not present

## 2018-06-21 DIAGNOSIS — C61 Malignant neoplasm of prostate: Secondary | ICD-10-CM | POA: Diagnosis not present

## 2018-06-21 DIAGNOSIS — I10 Essential (primary) hypertension: Secondary | ICD-10-CM | POA: Diagnosis not present

## 2018-06-22 DIAGNOSIS — S81811A Laceration without foreign body, right lower leg, initial encounter: Secondary | ICD-10-CM | POA: Diagnosis not present

## 2018-06-24 DIAGNOSIS — S81811D Laceration without foreign body, right lower leg, subsequent encounter: Secondary | ICD-10-CM | POA: Diagnosis not present

## 2018-06-27 DIAGNOSIS — D696 Thrombocytopenia, unspecified: Secondary | ICD-10-CM | POA: Diagnosis not present

## 2018-06-27 DIAGNOSIS — E785 Hyperlipidemia, unspecified: Secondary | ICD-10-CM | POA: Diagnosis not present

## 2018-06-27 DIAGNOSIS — Z7984 Long term (current) use of oral hypoglycemic drugs: Secondary | ICD-10-CM | POA: Diagnosis not present

## 2018-06-27 DIAGNOSIS — E1165 Type 2 diabetes mellitus with hyperglycemia: Secondary | ICD-10-CM | POA: Diagnosis not present

## 2018-06-27 DIAGNOSIS — Z0000 Encounter for general adult medical examination without abnormal findings: Secondary | ICD-10-CM | POA: Diagnosis not present

## 2018-06-28 DIAGNOSIS — S81811D Laceration without foreign body, right lower leg, subsequent encounter: Secondary | ICD-10-CM | POA: Diagnosis not present

## 2018-07-02 ENCOUNTER — Encounter (HOSPITAL_BASED_OUTPATIENT_CLINIC_OR_DEPARTMENT_OTHER): Payer: Medicare Other | Attending: Internal Medicine

## 2018-07-02 ENCOUNTER — Other Ambulatory Visit: Payer: Self-pay

## 2018-07-02 DIAGNOSIS — I10 Essential (primary) hypertension: Secondary | ICD-10-CM | POA: Diagnosis not present

## 2018-07-02 DIAGNOSIS — E1140 Type 2 diabetes mellitus with diabetic neuropathy, unspecified: Secondary | ICD-10-CM | POA: Diagnosis not present

## 2018-07-02 DIAGNOSIS — Z8546 Personal history of malignant neoplasm of prostate: Secondary | ICD-10-CM | POA: Diagnosis not present

## 2018-07-02 DIAGNOSIS — Z7984 Long term (current) use of oral hypoglycemic drugs: Secondary | ICD-10-CM | POA: Insufficient documentation

## 2018-07-02 DIAGNOSIS — G7000 Myasthenia gravis without (acute) exacerbation: Secondary | ICD-10-CM | POA: Diagnosis not present

## 2018-07-02 DIAGNOSIS — E1136 Type 2 diabetes mellitus with diabetic cataract: Secondary | ICD-10-CM | POA: Insufficient documentation

## 2018-07-02 DIAGNOSIS — W19XXXA Unspecified fall, initial encounter: Secondary | ICD-10-CM | POA: Diagnosis not present

## 2018-07-02 DIAGNOSIS — S81811A Laceration without foreign body, right lower leg, initial encounter: Secondary | ICD-10-CM | POA: Insufficient documentation

## 2018-07-04 ENCOUNTER — Other Ambulatory Visit: Payer: Self-pay

## 2018-07-04 ENCOUNTER — Telehealth (INDEPENDENT_AMBULATORY_CARE_PROVIDER_SITE_OTHER): Payer: Medicare Other | Admitting: Neurology

## 2018-07-04 VITALS — Ht 68.0 in | Wt 185.0 lb

## 2018-07-04 DIAGNOSIS — G7001 Myasthenia gravis with (acute) exacerbation: Secondary | ICD-10-CM

## 2018-07-04 DIAGNOSIS — R252 Cramp and spasm: Secondary | ICD-10-CM | POA: Diagnosis not present

## 2018-07-04 DIAGNOSIS — G7000 Myasthenia gravis without (acute) exacerbation: Secondary | ICD-10-CM

## 2018-07-04 DIAGNOSIS — G459 Transient cerebral ischemic attack, unspecified: Secondary | ICD-10-CM

## 2018-07-04 NOTE — Progress Notes (Signed)
Virtual Visit via Telephone Note The purpose of this virtual visit is to provide medical care while limiting exposure to the novel coronavirus.    Consent was obtained for phone visit:  Yes.   Answered questions that patient had about telehealth interaction:  Yes.   I discussed the limitations, risks, security and privacy concerns of performing an evaluation and management service by telephone. I also discussed with the patient that there may be a patient responsible charge related to this service. The patient expressed understanding and agreed to proceed.  Pt location: Home Physician Location: office Name of referring provider:  London Pepper, MD I connected with .Aretha Parrot at patients initiation/request on 07/04/2018 at  8:30 AM EDT by telephone and verified that I am speaking with the correct person using two identifiers.  Pt MRN:  [id number] Pt DOB:  Aug 12, 1940   History of Present Illness: This is a 78 year-old man returning for follow-up for myasthenia gravis.  He is down to prednisone 25mg  for the past two weeks and is doing well.  No breakthrough facial or limb weakness.  Unfortunately, last week he injured is right lower leg while climbing into a garden tub and saw the wound center on Monday. They had noticed that he could not perceive vibration in the feet and was told to discuss with me.  He has known neuropathy which I briefly discussed at his initial visit with me in March.    On 6/13, he had three spells of left hand numbness and weakness which lasted about 1 minute and self resolved.  He felt that his hand was weak and he was unable to extend the fingers and open his hand.  There was no associated face or leg numbness/weakness on the left side.  He has not had these spells again.  He takes aspirin and Lipitor 20 mg daily.    He also complains of intermittent cramping of the left foot, which usually always occurs at nighttime.  He often has to wake up to stretch the foot  to get relief.  He will be moving to [place] to be closer to his daughter on July 15th.  His daughter is assisting in setting up primary care doctor and neurologist for ongoing care.   Assessment and Plan:   1.  Seropositive myasthenia gravis without exacerbation, diagnosed February 2020.  He is doing very well on a prednisone taper and has been on 25 mg for the past 2 weeks.  Unfortunately, he suffered injury to his right leg and is followed by wound clinic.  He was informed that prednisone can contribute to poor wound healing, and even more important to try and taper him safely.  -Reduce prednisone to 20 mg daily  -Continue Mestinon 30 mg in the morning and afternoon.  Stop nighttime Mestinon to see if this will help with his leg cramps  2.  Transient left hand numbness and weakness, need to evaluate for TIA  - Check CT head, CTA head  - US carotids  - Continue aspirin 81mg  daily, liptior 20mg   - If spells recur, start dual antiplatelet with plavix 75mg   - Stroke warning signs discussed  3.  Left leg cramps  -Hopefully reducing Mestinon will help  -Encouraged to stay well-hydrated and start leg stretches  Follow Up Instructions:    -I discussed the assessment and treatment plan with the patient. The patient was provided an opportunity to ask questions and all were answered. The patient agreed with the  plan and demonstrated an understanding of the instructions.   The patient was advised to call back or seek an in-person evaluation if the symptoms worsen or if the condition fails to improve as anticipated.  Return to clinic on July 7 at 3:30p to be evaluated in the office  Total Time spent in visit with the patient was:  35 min, of which 100% of the time was spent in counseling and/or coordinating care.   Pt understands and agrees with the plan of care outlined.     Travis Berthold, DO

## 2018-07-05 ENCOUNTER — Telehealth: Payer: Self-pay

## 2018-07-05 ENCOUNTER — Telehealth: Payer: Medicare Other | Admitting: Neurology

## 2018-07-05 NOTE — Telephone Encounter (Signed)
Called [facility] and CT scans nor U/S have been scheduled. The receptionist instructed that the pt would be scheduled faster if he called on his own.  Spoke with pts wife Deanna. Number given to [place].

## 2018-07-09 DIAGNOSIS — G7000 Myasthenia gravis without (acute) exacerbation: Secondary | ICD-10-CM | POA: Diagnosis not present

## 2018-07-09 DIAGNOSIS — Z8546 Personal history of malignant neoplasm of prostate: Secondary | ICD-10-CM | POA: Diagnosis not present

## 2018-07-09 DIAGNOSIS — I10 Essential (primary) hypertension: Secondary | ICD-10-CM | POA: Diagnosis not present

## 2018-07-09 DIAGNOSIS — E1140 Type 2 diabetes mellitus with diabetic neuropathy, unspecified: Secondary | ICD-10-CM | POA: Diagnosis not present

## 2018-07-09 DIAGNOSIS — S81811A Laceration without foreign body, right lower leg, initial encounter: Secondary | ICD-10-CM | POA: Diagnosis not present

## 2018-07-09 DIAGNOSIS — E1136 Type 2 diabetes mellitus with diabetic cataract: Secondary | ICD-10-CM | POA: Diagnosis not present

## 2018-07-16 DIAGNOSIS — S81811A Laceration without foreign body, right lower leg, initial encounter: Secondary | ICD-10-CM | POA: Diagnosis not present

## 2018-07-16 DIAGNOSIS — G7000 Myasthenia gravis without (acute) exacerbation: Secondary | ICD-10-CM | POA: Diagnosis not present

## 2018-07-16 DIAGNOSIS — E1140 Type 2 diabetes mellitus with diabetic neuropathy, unspecified: Secondary | ICD-10-CM | POA: Diagnosis not present

## 2018-07-16 DIAGNOSIS — E1136 Type 2 diabetes mellitus with diabetic cataract: Secondary | ICD-10-CM | POA: Diagnosis not present

## 2018-07-16 DIAGNOSIS — Z8546 Personal history of malignant neoplasm of prostate: Secondary | ICD-10-CM | POA: Diagnosis not present

## 2018-07-16 DIAGNOSIS — I10 Essential (primary) hypertension: Secondary | ICD-10-CM | POA: Diagnosis not present

## 2018-07-23 ENCOUNTER — Ambulatory Visit
Admission: RE | Admit: 2018-07-23 | Discharge: 2018-07-23 | Disposition: A | Discharge status: Home / Self Care | Payer: Medicare Other | Source: Ambulatory Visit | Attending: Neurology | Admitting: Neurology

## 2018-07-23 DIAGNOSIS — G459 Transient cerebral ischemic attack, unspecified: Secondary | ICD-10-CM

## 2018-07-23 DIAGNOSIS — G7001 Myasthenia gravis with (acute) exacerbation: Secondary | ICD-10-CM

## 2018-07-23 DIAGNOSIS — C61 Malignant neoplasm of prostate: Secondary | ICD-10-CM | POA: Diagnosis not present

## 2018-07-23 DIAGNOSIS — Z8673 Personal history of transient ischemic attack (TIA), and cerebral infarction without residual deficits: Secondary | ICD-10-CM | POA: Diagnosis not present

## 2018-07-23 DIAGNOSIS — I6523 Occlusion and stenosis of bilateral carotid arteries: Secondary | ICD-10-CM | POA: Diagnosis not present

## 2018-07-23 MED ORDER — IOPAMIDOL (ISOVUE-370) INJECTION 76%
75.0000 mL | Freq: Once | INTRAVENOUS | Status: AC | PRN
  Administered 2018-07-23: 75 mL via INTRAVENOUS

## 2018-07-24 ENCOUNTER — Other Ambulatory Visit: Payer: Self-pay

## 2018-07-24 ENCOUNTER — Encounter (HOSPITAL_BASED_OUTPATIENT_CLINIC_OR_DEPARTMENT_OTHER): Payer: Medicare Other | Attending: Internal Medicine

## 2018-07-24 ENCOUNTER — Encounter: Payer: Self-pay | Admitting: Neurology

## 2018-07-24 ENCOUNTER — Ambulatory Visit (INDEPENDENT_AMBULATORY_CARE_PROVIDER_SITE_OTHER): Payer: Medicare Other | Admitting: Neurology

## 2018-07-24 VITALS — BP 164/66 | HR 84 | Ht 68.0 in | Wt 193.0 lb

## 2018-07-24 DIAGNOSIS — L97812 Non-pressure chronic ulcer of other part of right lower leg with fat layer exposed: Secondary | ICD-10-CM | POA: Diagnosis not present

## 2018-07-24 DIAGNOSIS — G459 Transient cerebral ischemic attack, unspecified: Secondary | ICD-10-CM | POA: Diagnosis not present

## 2018-07-24 DIAGNOSIS — S81811A Laceration without foreign body, right lower leg, initial encounter: Secondary | ICD-10-CM | POA: Diagnosis not present

## 2018-07-24 DIAGNOSIS — G250 Essential tremor: Secondary | ICD-10-CM

## 2018-07-24 DIAGNOSIS — G609 Hereditary and idiopathic neuropathy, unspecified: Secondary | ICD-10-CM | POA: Diagnosis not present

## 2018-07-24 DIAGNOSIS — G7000 Myasthenia gravis without (acute) exacerbation: Secondary | ICD-10-CM

## 2018-07-24 MED ORDER — CALCIUM CARBONATE-VITAMIN D 500-200 MG-UNIT PO TABS: 1.0000 | ORAL_TABLET | Freq: Two times a day (BID) | ORAL | 0 refills | Status: AC

## 2018-07-24 MED ORDER — PREDNISONE 20 MG PO TABS: 20.0000 mg | ORAL_TABLET | Freq: Every day | ORAL | 0 refills | Status: AC

## 2018-07-24 MED ORDER — PYRIDOSTIGMINE BROMIDE 60 MG PO TABS: ORAL_TABLET | ORAL | 0 refills | Status: AC

## 2018-07-24 NOTE — Patient Instructions (Addendum)
Continue prednisone 20mg  daily  Continue mestinon 30mg  in the morning and evening.  OK to take extra dose as needed Continue calcium supplements twice daily  Take extra care when you are walking on uneven ground - use a hiking stick/cane for support Take care when your eyes are closed, such as when in the shower - use handrails  It was a pleasure to take care of you.  All the best with your move to [place]

## 2018-07-24 NOTE — Progress Notes (Signed)
Follow-up Visit   Date: 07/24/18   Travis Morris MRN: [id number] DOB: January 14, 1941   Interim History: Travis Morris is a 78 y.o. right-handed Caucasian male with hypertension, GERD, prostate cancer, and essential tremor (followed by Dr. Carles Collet) returning to the clinic with seropositive myasthenia gravis, neuropathy, and transient left hand weakness.  The patient was accompanied to the clinic by wife who also provides collateral information.    History of present illness: In early December 2019, he began noticing difficulty with swallowing and speaking.  He felt that he would tire quickly and it would take up to an hour for him to eat.  He had lost 25lb.  Later in February, he began having vertical diplopia which prompted him to go to the ER. He denies having droopy eyelids or weakness of the arms and legs.  There was concern for myasthenia gravis by in-patient neurology team and he was started on mestinon 60mg  three times daily, but he developed dizziness and it was reduced to 30mg  three times daily and told to follow-up with out-patient neurology.  Unfortunately, he was readmitted on 2/18 - 2/25 with progressive dysphagia and treated with IVIG x 5 days due to myasthenia crisis. He was also started on prednisone 60mg /d.   Swallowing function was impaired to the point where he needed a NG tube placement for feedings. Upon discharge, his speech had become stronger, but he continued to have double vision and difficulty swallowing.  Since being home, his double vision has resolved and he is coughing/choking much less.  He continues to eat only a soft diet.  He is able to swallow thin liquids and soft foods, but cannot eat hard textured foods.  He is supplementing meals with 1 bottle of boost.   He was discharged on prednisone 60mg  daily and mestinon 30mg  at 9am, 2pm, and 6pm.   He received monthly maintenance dose of IVIG in March and April, after which it was discontinued.  Prednisone taper was  started in late March 2020, which he has been tolerating well.   UPDATE 07/24/2018:  He is here for follow-up visit prior to relocating to [place] to be closer to their daughter.  He will be moving next week.  His myasthenia gravis is well controlled on prednisone 20 mg daily.  He denies any diplopia, droopy eyelids, difficulty swallowing/talking, or limb weakness.  At his last virtual visit, he described one spells of left hand numbness and hand weakness, which resolved within a few minutes.  He underwent ultrasound carotids and CT/A head which did not show any significant stenosis or abnormalities. He remains on aspirin 81mg  and statin therapy.  No further spells of hand weakness/numbness.   For the past several years, he has noticed numbness over the balls of the feet and toes, as if his socks are rolled down.  Symptoms are constant and alleviated by nothing.  He does not have painful paresthesias.  He endorses mild imbalance and walks unassisted.   He is prediabetic.  No history of heavy alcohol use or chemotherapy exposure.   Medications:  Current Outpatient Medications on File Prior to Visit  Medication Sig Dispense Refill  . Ascorbic Acid (VITAMIN C PO) Take by mouth daily.    Marland Kitchen aspirin 81 MG tablet Take 81 mg by mouth daily.      Marland Kitchen atorvastatin (LIPITOR) 20 MG tablet Take 20 mg by mouth daily.    . carvedilol (COREG) 12.5 MG tablet Take 1 tablet (12.5 mg total) by  mouth 2 (two) times daily with a meal. 60 tablet 0  . losartan-hydrochlorothiazide (HYZAAR) 100-25 MG tablet Take 1 tablet by mouth daily. 90 tablet 1  . metFORMIN (GLUCOPHAGE) 500 MG tablet Take 500 mg by mouth 2 (two) times daily with a meal.    . Multiple Vitamin (MULTIVITAMINS PO) Take 1 tablet by mouth daily. CENTRUM SILVER     . omeprazole (PRILOSEC) 40 MG capsule Take by mouth daily.    Glory Rosebush VERIO test strip AS DIRECTED ONCE A DAY IN VITRO    . oxybutynin (DITROPAN) 5 MG tablet Take 5 mg by mouth 3 (three) times daily.     . vitamin E 400 UNIT capsule Take 400 Units by mouth daily.       No current facility-administered medications on file prior to visit.     Allergies: No Known Allergies  Review of Systems:  CONSTITUTIONAL: No fevers, chills, night sweats, or weight loss.  EYES: No visual changes or eye pain ENT: No hearing changes.  No history of nose bleeds.   RESPIRATORY: No cough, wheezing and shortness of breath.   CARDIOVASCULAR: Negative for chest pain, and palpitations.   GI: Negative for abdominal discomfort, blood in stools or black stools.  No recent change in bowel habits.   GU:  No history of incontinence.   MUSCLOSKELETAL: No history of joint pain or swelling.  No myalgias.   SKIN: Negative for lesions, rash, and itching.   ENDOCRINE: Negative for cold or heat intolerance, polydipsia or goiter.   PSYCH:  No depression or anxiety symptoms.   NEURO: As Above.   Vital Signs:  BP (!) 164/66   Pulse 84   Ht 5\' 8"  (1.727 m)   Wt 193 lb (87.5 kg)   SpO2 94%   BMI 29.35 kg/m    General Medical Exam:   General:  Well appearing, comfortable  Eyes/ENT: see cranial nerve examination.   Neck:  No carotid bruits. Respiratory:  Clear to auscultation, good air entry bilaterally.   Cardiac:  Regular rate and rhythm, no murmur.   Ext:  No edema, right lower leg has dressing  Neurological Exam: MENTAL STATUS including orientation to time, place, person, recent and remote memory, attention span and concentration, language, and fund of knowledge is normal.  Speech is not dysarthric, mildly  tremulous.  CRANIAL NERVES:   Pupils equal round and reactive to light.  Normal conjugate, extra-ocular eye movements in all directions of gaze.  No ptosis at rest or with sustained upgaze.  Face is symmetric. Facial muscles are 5/5.    MOTOR:  Motor strength is 5/5 in all extremities, without fatiguability.  Bilateral intentional tremor of the hands.  No atrophy.  No pronator drift.   SENSORY:   Vibration, pin prick, and temperature is reduced below the ankles and trace at the great toe bilaterally. Rhomberg testing is positive.   COORDINATION/GAIT:  Normal finger-to- nose-finger.  He is able to stand up without using arms to push off.  Gait narrow based and stable.   Data: MRI brain 02/28/2018: 1. No acute intracranial abnormality. 2. Mild chronic small vessel ischemic disease  CT chest with contrast 03/06/2018:  Moderate circumferential wall thickening is seen involving the superior portion of the thoracic esophagus; it is uncertain if this is inflammatory or neoplastic in origin. Endoscopy is recommended for further evaluation. Aortic Atherosclerosis  Labs 02/28/2018:  AChR blocking 60*, modulating 63*, binding <0.03  US carotids 07/23/2018:  Color duplex indicates moderate heterogeneous and  calcified plaque, with no hemodynamically significant stenosis by duplex criteria in the extracranial cerebrovascular circulation.  CT/A head 07/23/2018:  Normal appearance of the brain itself by CT. CT angiography is negative for significant finding. Ordinary atherosclerotic calcification in the carotid siphon regions but without stenosis greater than 30%. This is virtually uniformly present at this age. No distal vessel occlusion or stenosis.  Lab Results  Component Value Date   HGBA1C 6.3 (H) 02/28/2018   IMPRESSION/PLAN: 1.  Seropositive myasthenia gravis without exacerbation, diagnosed February 2020.  Initially presented with bulbar weakness and treated with IVIG and prednisone 60mg .  He has responded well to therapy and no longer is on IVIG.  He has been on a tapering dose of prednisone and down to 20mg /d and anticipate that he will do well on a slow taper and may not need steroid-sparing agent as there has been no breakthrough weakness going down on his prednisone.  - I will keep him on prednisone 20mg  daily and allow his new neurologist to guide steroid taper  - Continue mestinon 30mg  in  the morning and afternoon (low dose due to cramps)  - Continue vitamin D + calcium, prilosec  - He will follow-up with neurologist in [place], Dr. Milus Mallick, for ongoing care  2.  Transient left hand numbness/weakness in June 2020  - CT/A head and US carotids was personally viewed and shows no hemodynamically significant stenosis  - Continue aspirin 81mg  and lipitor 20mg  daily  - If spells recur, start dual antiplatelet with ASA + plavix  - Stroke warning signs discussed  3.  Idiopathic peripheral neuropathy manifesting with distal numbness and sensory ataxia  - Fall precautions discussed, such as using a hiking stick/cane when on uneven ground  - Use hand rails or shower chair to reduce risk of falls in the shower  - Daily foot inspection  4.  Essential tremor, last seen by Dr. Carles Collet in May 2019 at which time it was decided not to start medication (primidone)  I wished him and his wife all the best with their move to [place].    Thank you for allowing me to participate in patient's care.  If I can answer any additional questions, I would be pleased to do so.    Sincerely,    Lawana Hartzell K. Posey Pronto, DO

## 2018-07-30 DIAGNOSIS — R3915 Urgency of urination: Secondary | ICD-10-CM | POA: Diagnosis not present

## 2018-07-30 DIAGNOSIS — S81811A Laceration without foreign body, right lower leg, initial encounter: Secondary | ICD-10-CM | POA: Diagnosis not present

## 2018-07-30 DIAGNOSIS — L97812 Non-pressure chronic ulcer of other part of right lower leg with fat layer exposed: Secondary | ICD-10-CM | POA: Diagnosis not present

## 2018-07-30 DIAGNOSIS — R972 Elevated prostate specific antigen [PSA]: Secondary | ICD-10-CM | POA: Diagnosis not present

## 2018-07-30 DIAGNOSIS — C61 Malignant neoplasm of prostate: Secondary | ICD-10-CM | POA: Diagnosis not present

## 2018-07-30 DIAGNOSIS — N401 Enlarged prostate with lower urinary tract symptoms: Secondary | ICD-10-CM | POA: Diagnosis not present

## 2018-08-15 ENCOUNTER — Telehealth: Payer: Self-pay | Admitting: Neurology

## 2018-08-15 NOTE — Telephone Encounter (Signed)
Pt states that Dr Posey Pronto was going to do a referral letter for a Dr in [place] and he is wanting to check the status of that please call

## 2018-08-15 NOTE — Telephone Encounter (Signed)
Please advise on this. Thanks 

## 2018-08-16 NOTE — Telephone Encounter (Signed)
Please fax my last clinic note. Thanks.

## 2018-08-16 NOTE — Telephone Encounter (Signed)
My note indicates he will be seeing Dr. Milus Mallick - can you ask him to verify the practice name, telephone number, and fax details?  Once we have that information, we can send a referral to establish care for myasthenia gravis.

## 2018-08-16 NOTE — Telephone Encounter (Signed)
[phone] phone and [phone] fax for Dr. Morley Kos at [facility] in [place], [place]. Thanks!

## 2018-08-16 NOTE — Telephone Encounter (Signed)
Line buys at [phone]

## 2018-08-17 NOTE — Telephone Encounter (Signed)
Sent clinic notes faxed to [phone].

## 2018-08-30 NOTE — Telephone Encounter (Signed)
Clinic is saying they did not receive these. Can this be faxed again please per patient. Thanks!

## 2018-08-30 NOTE — Telephone Encounter (Signed)
Resent Dr. Serita Grit last clinic note to [facility] in [place] to fax provided.

## 2018-10-19 ENCOUNTER — Other Ambulatory Visit: Payer: Self-pay | Admitting: Neurology

## 2018-10-19 NOTE — Telephone Encounter (Signed)
Requested Prescriptions   Pending Prescriptions Disp Refills  . predniSONE (DELTASONE) 20 MG tablet [Pharmacy Med Name: PREDNISONE 20 MG TABLET] 90 tablet 0    Sig: TAKE 1 TABLET BY MOUTH EVERY DAY   Rx last filled:07/24/18 #90 0 refills Pt last seen:07/24/18  Follow up appt scheduled: NONE  DENIED PATIENT NO LONGER UNDER PROVIDER CARE

## 2020-01-22 IMAGING — CT CT CHEST W/ CM
2 of 4 series · 15 of 36 positions shown, 18 images · IV contrast (APPLIED)
Comparison: Radiograph of same day.

CLINICAL DATA: Swallowing difficulty.

EXAM:
CT CHEST WITH CONTRAST
TECHNIQUE: Multidetector CT imaging of the chest was performed during
intravenous contrast administration.
CONTRAST:  75mL OMNIPAQUE IOHEXOL 300 MG/ML  SOLN

[Series 3: thorax 2.0 i31f 2 · axial · 0.93mm/px · z∈[-240,+22]mm · 12 of 153 slices shown, 15 images]
[im 11/153  mediastinal]
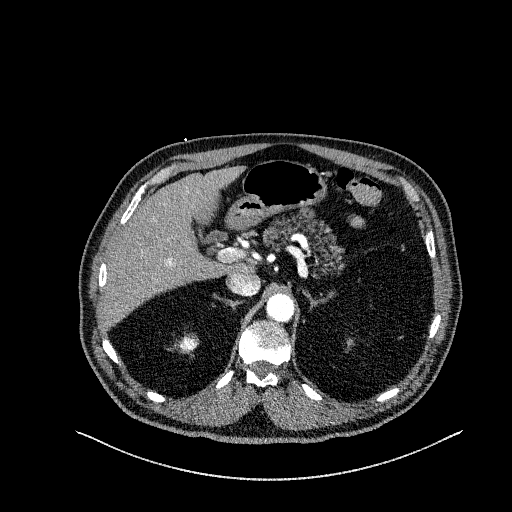
[im 11/153  lung]
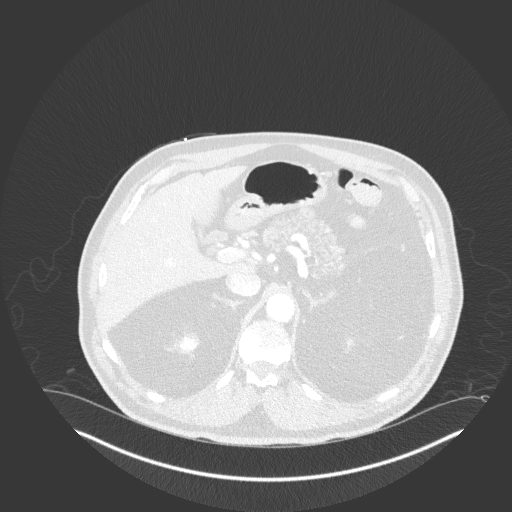
[im 22/153  lung]
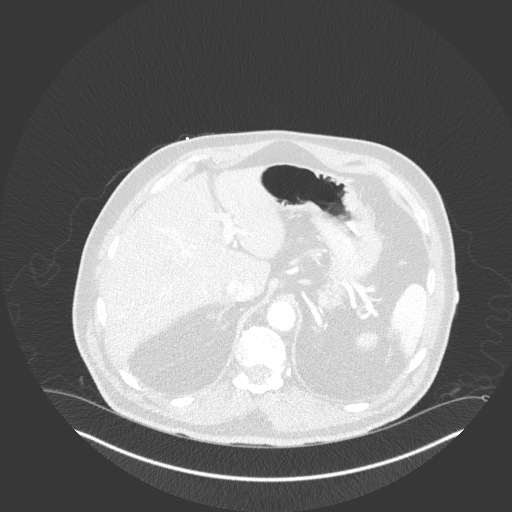
[im 33/153  lung]
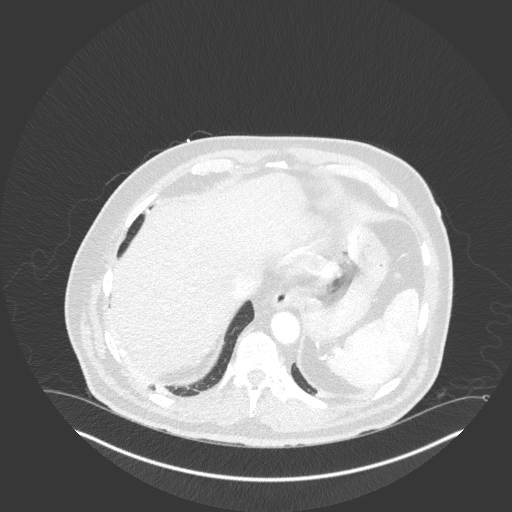
[im 44/153  lung]
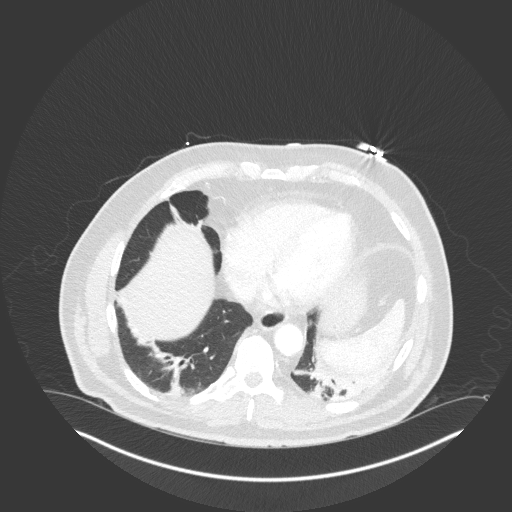
[im 55/153  mediastinal]
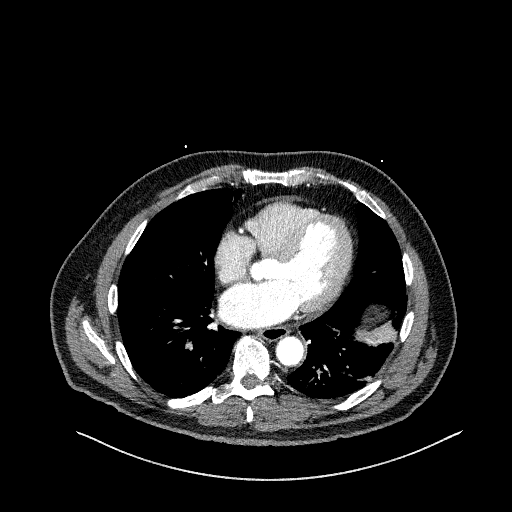
[im 55/153  lung]
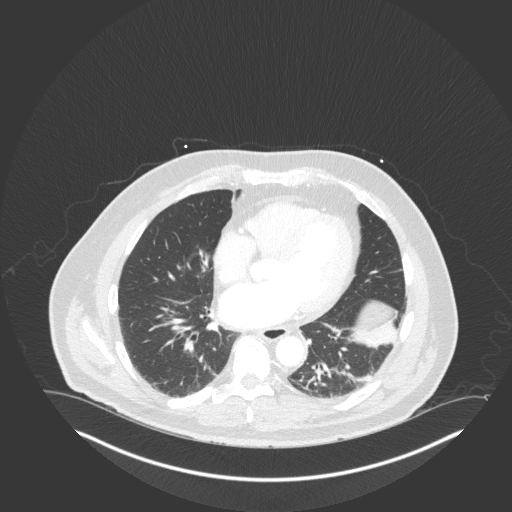
[im 66/153  lung]
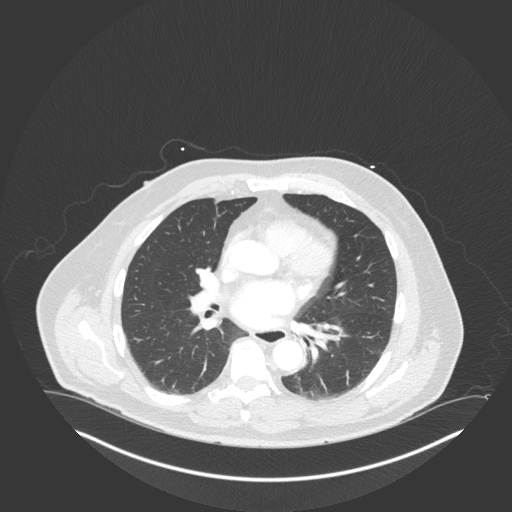
[im 87/153  lung]
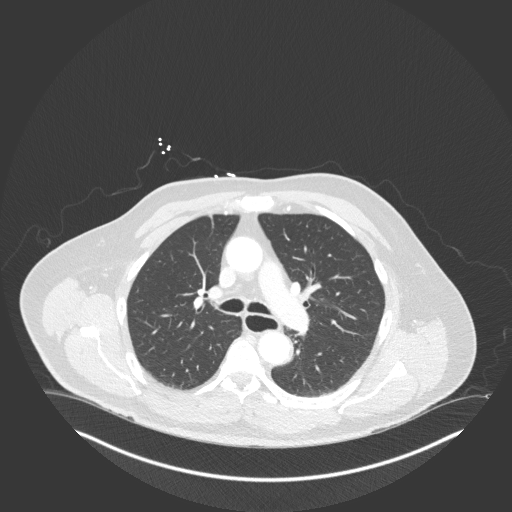
[im 98/153  lung]
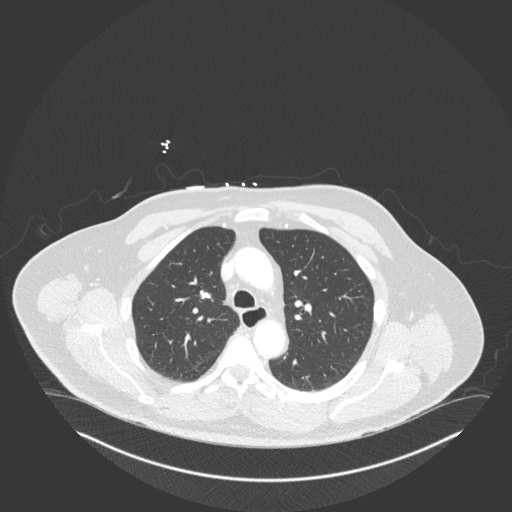
[im 109/153  mediastinal]
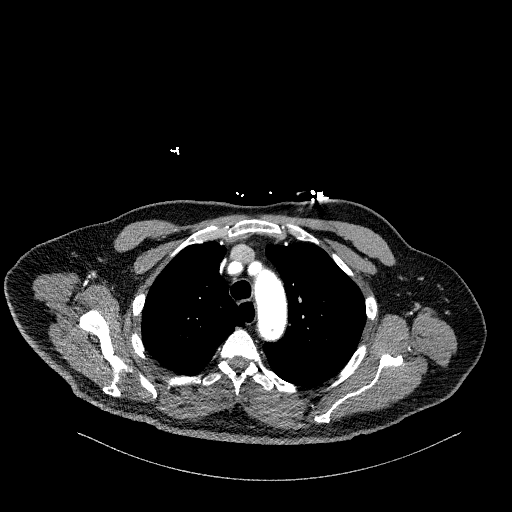
[im 109/153  lung]
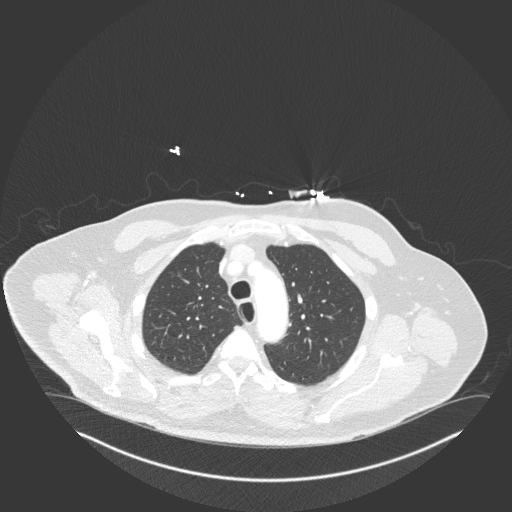
[im 120/153  lung]
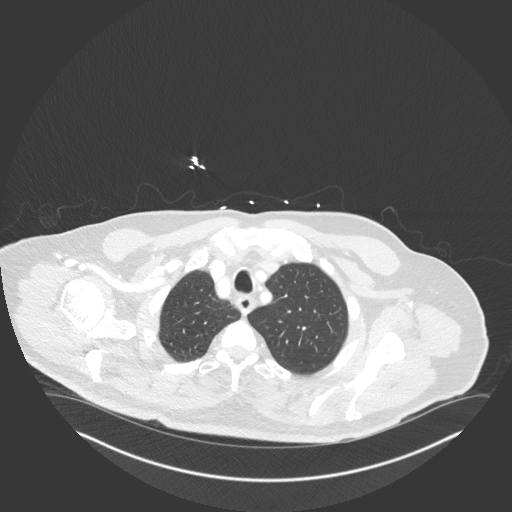
[im 131/153  lung]
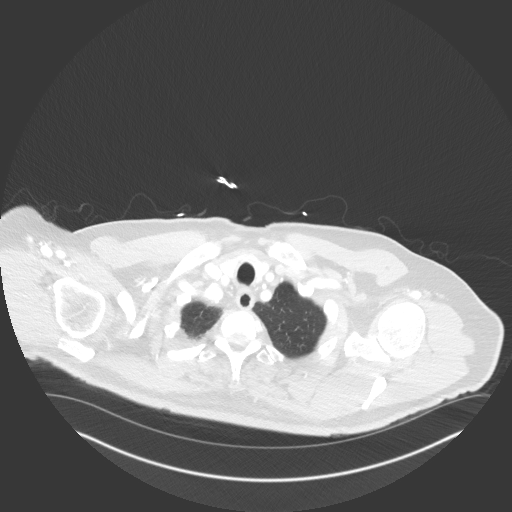
[im 142/153  lung]
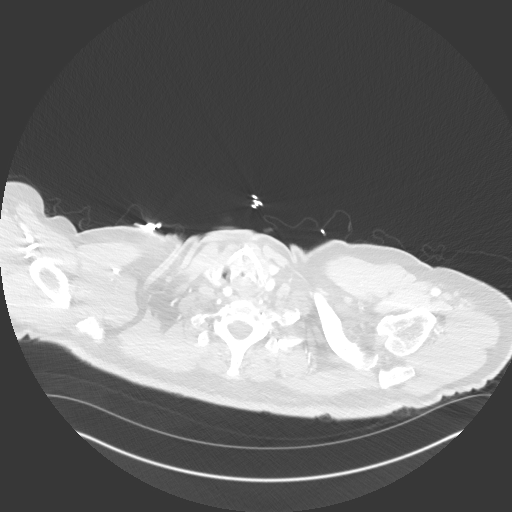

[Series 5: coronal · coronal · 0.60mm/px · 3 of 139 slices shown]
[im 28/139  lung]
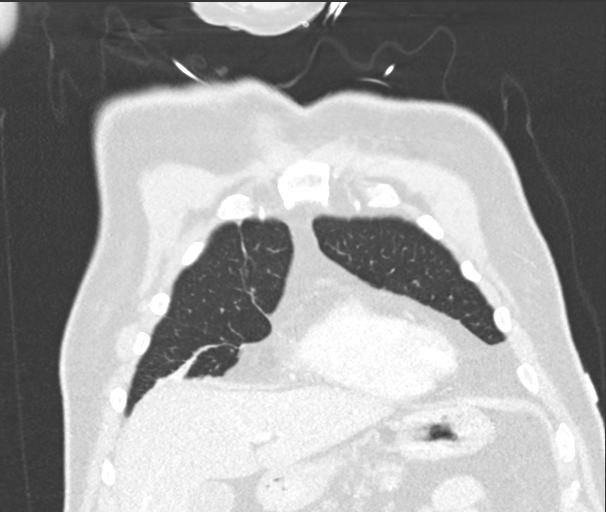
[im 56/139  lung]
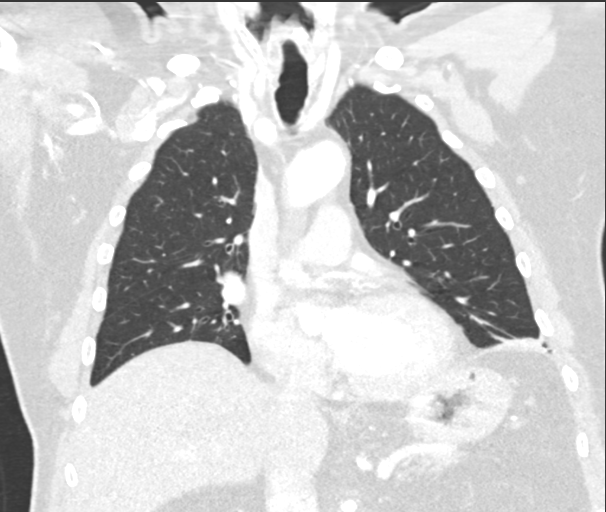
[im 83/139  lung]
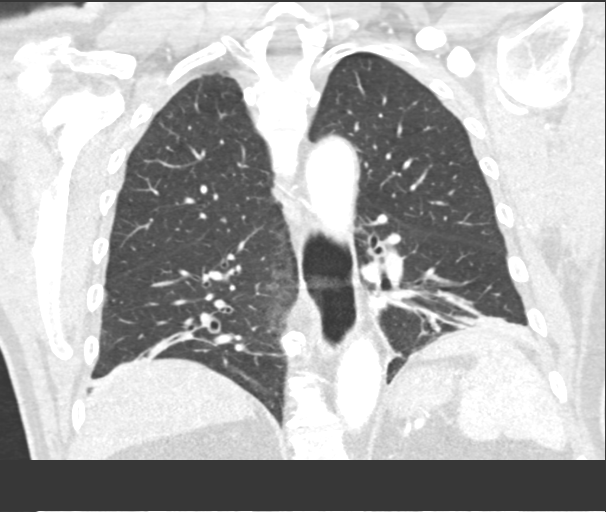

[15 of 36 positions shown; findings below may reference images not displayed]

FINDINGS: Cardiovascular: Atherosclerosis of thoracic aorta is noted without
aneurysm or dissection. Normal cardiac size. No pericardial
effusion.

Mediastinum/Nodes: No enlarged mediastinal, hilar, or axillary lymph
nodes. Thyroid gland and trachea demonstrate no significant
findings. There appears to be moderate wall thickening involving the
superior thoracic esophagus.

Lungs/Pleura: No pneumothorax or pleural effusion is noted. Mild
bibasilar subsegmental atelectasis is noted.

Upper Abdomen: No acute abnormality.

Musculoskeletal: No chest wall abnormality. No acute or significant
osseous findings.
IMPRESSION: Moderate circumferential wall thickening is seen involving the
superior portion of the thoracic esophagus; it is uncertain if this
is inflammatory or neoplastic in origin. Endoscopy is recommended
for further evaluation.

Aortic Atherosclerosis ([id number]).

## 2020-01-22 IMAGING — DX DG CHEST 1V PORT
1 series · 1 of 1 positions shown · non-contrast
Comparison: 08/24/2014

CLINICAL DATA: Weakness and dysphagia.

EXAM:
PORTABLE CHEST 1 VIEW

[chest ap]
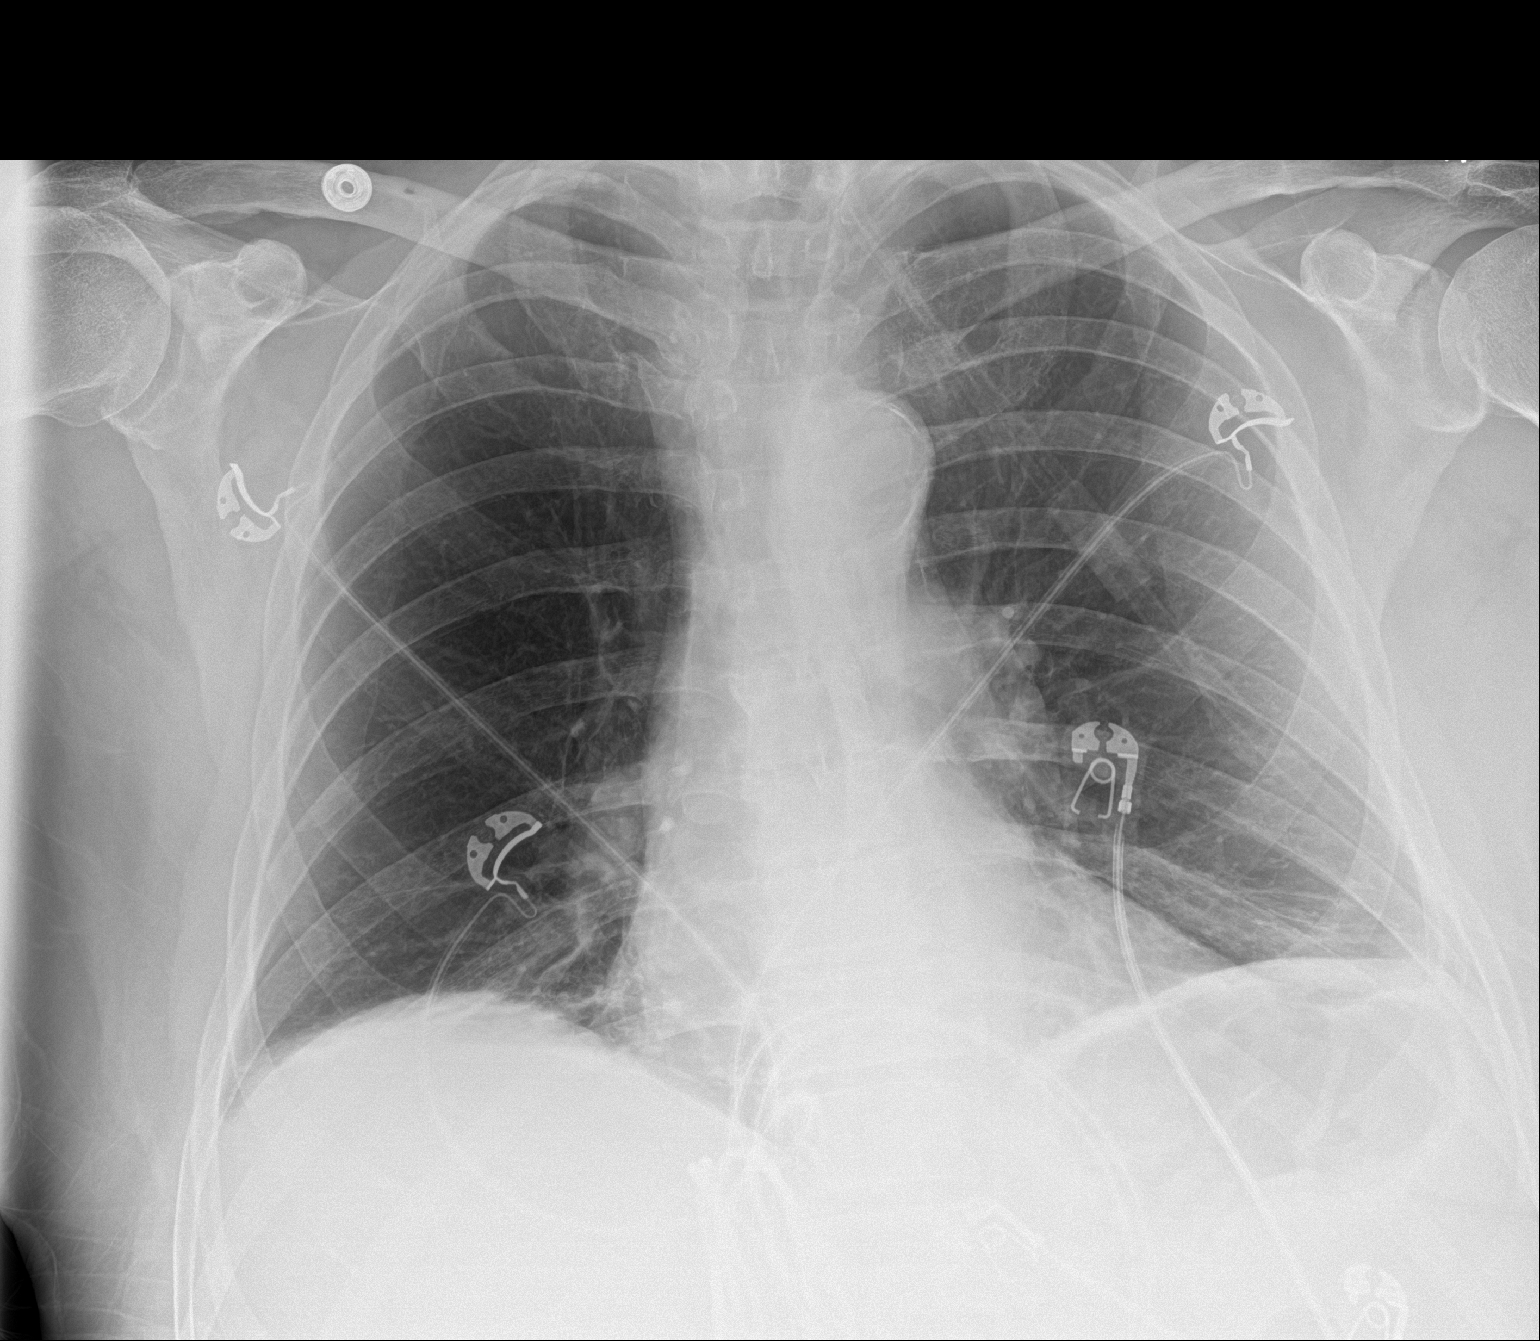

[1 of 1 positions shown; findings below may reference images not displayed]

FINDINGS: The heart size and mediastinal contours are within normal limits.
Stable bibasilar pulmonary scarring. There is no evidence of
pulmonary edema, consolidation, pneumothorax, nodule or pleural
fluid. The visualized skeletal structures are unremarkable.
IMPRESSION: No active disease.

## 2020-02-05 ENCOUNTER — Encounter: Payer: Self-pay | Admitting: Speech Pathology

## 2020-02-05 NOTE — Therapy (Signed)
[facility] [address] [place], [place], [address] Phone: [phone]   Fax:  [phone]  Patient Details  Name: Travis Morris MRN: [id number] Date of Birth: [birth year] Referring Provider:  Dr. Frankey Poot  Encounter Date: 02/05/2020 SPEECH THERAPY DISCHARGE SUMMARY  Visits from Start of Care: 9  Current functional level related to goals / functional outcomes: See goals below   Remaining deficits: Dysphag   Education / Equipment: Diet modifications, swallow precautions, s/s of aspiration pna Plan: Patient agrees to discharge.  Patient goals were partially met. Patient is being discharged due to not returning since the last visit.  ?????      SLP Short Term Goals - 02/05/20 1005      SLP SHORT TERM GOAL #1   Title pt will follow swallow precautions with rare min A in 2 sessions    Status Achieved      SLP SHORT TERM GOAL #2   Title pt will perform swallowing HEP with rare min A in 2 sessions    Status Achieved      SLP SHORT TERM GOAL #3   Title pt will provide SLP 3 overt s/s aspiration PNA with modified independence in 3 sessions    Status Achieved           SLP Long Term Goals - 02/05/20 1006      SLP LONG TERM GOAL #1   Title pt will complete HEP for dysphagia with modified independence in 3 sessions    Time 4    Period Weeks   or 17 sessions, for all LTGs   Status Partially met     SLP LONG TERM GOAL #2   Title pt will follow swallow precautions with POs with modified independence over three sessions    Baseline 03-23-18    Time 4    Period Weeks    Status Partially met                                                          Sayward Horvath, Annye Rusk MS, CCC-SLP 02/05/2020, 10:06 AM  [facility] [address] [place], [place], [address] Phone: [phone]   Fax:  [phone]
